# Patient Record
Sex: Female | Born: 1935 | Race: White | Hispanic: No | Smoking: Never smoker
Health system: Southern US, Community
[De-identification: ages and names within clinical notes are randomized; demographics above are authoritative.]

## PROBLEM LIST (undated history)

## (undated) ENCOUNTER — Inpatient Hospital Stay (HOSPITAL_COMMUNITY): Payer: Medicare Other

## (undated) DIAGNOSIS — M810 Age-related osteoporosis without current pathological fracture: Secondary | ICD-10-CM

## (undated) DIAGNOSIS — I83009 Varicose veins of unspecified lower extremity with ulcer of unspecified site: Secondary | ICD-10-CM

## (undated) DIAGNOSIS — Z9089 Acquired absence of other organs: Secondary | ICD-10-CM

## (undated) DIAGNOSIS — E538 Deficiency of other specified B group vitamins: Secondary | ICD-10-CM

## (undated) DIAGNOSIS — L97909 Non-pressure chronic ulcer of unspecified part of unspecified lower leg with unspecified severity: Secondary | ICD-10-CM

## (undated) DIAGNOSIS — I10 Essential (primary) hypertension: Secondary | ICD-10-CM

## (undated) DIAGNOSIS — N390 Urinary tract infection, site not specified: Secondary | ICD-10-CM

## (undated) DIAGNOSIS — Z9849 Cataract extraction status, unspecified eye: Secondary | ICD-10-CM

## (undated) DIAGNOSIS — F29 Unspecified psychosis not due to a substance or known physiological condition: Secondary | ICD-10-CM

## (undated) DIAGNOSIS — H9190 Unspecified hearing loss, unspecified ear: Secondary | ICD-10-CM

## (undated) DIAGNOSIS — R350 Frequency of micturition: Secondary | ICD-10-CM

## (undated) DIAGNOSIS — K900 Celiac disease: Secondary | ICD-10-CM

## (undated) DIAGNOSIS — R071 Chest pain on breathing: Secondary | ICD-10-CM

## (undated) DIAGNOSIS — Z9889 Other specified postprocedural states: Secondary | ICD-10-CM

## (undated) DIAGNOSIS — G4700 Insomnia, unspecified: Secondary | ICD-10-CM

## (undated) DIAGNOSIS — R000 Tachycardia, unspecified: Secondary | ICD-10-CM

## (undated) DIAGNOSIS — Z8659 Personal history of other mental and behavioral disorders: Secondary | ICD-10-CM

## (undated) HISTORY — DX: Unspecified psychosis not due to a substance or known physiological condition: F29

## (undated) HISTORY — DX: Cataract extraction status, unspecified eye: Z98.49

## (undated) HISTORY — DX: Other specified postprocedural states: Z98.89

## (undated) HISTORY — DX: Deficiency of other specified B group vitamins: E53.8

## (undated) HISTORY — DX: Non-pressure chronic ulcer of unspecified part of unspecified lower leg with unspecified severity: L97.909

## (undated) HISTORY — PX: ORIF ANKLE DISLOCATION: SUR918

## (undated) HISTORY — PX: TONSILLECTOMY: SHX5217

## (undated) HISTORY — PX: CATARACT EXTRACTION: SUR2

## (undated) HISTORY — PX: CARDIAC ELECTROPHYSIOLOGY MAPPING AND ABLATION: SHX1292

## (undated) HISTORY — DX: Frequency of micturition: R35.0

## (undated) HISTORY — DX: Acquired absence of other organs: Z90.89

## (undated) HISTORY — PX: HAND SURGERY: SHX662

## (undated) HISTORY — PX: OTHER SURGICAL HISTORY: SHX169

## (undated) HISTORY — DX: Unspecified hearing loss, unspecified ear: H91.90

## (undated) HISTORY — PX: CHOLECYSTECTOMY: SHX55

## (undated) HISTORY — DX: Chest pain on breathing: R07.1

## (undated) HISTORY — DX: Essential (primary) hypertension: I10

## (undated) HISTORY — DX: Personal history of other mental and behavioral disorders: Z86.59

## (undated) HISTORY — PX: ROTATOR CUFF REPAIR: SHX139

## (undated) HISTORY — DX: Tachycardia, unspecified: R00.0

## (undated) HISTORY — DX: Varicose veins of unspecified lower extremity with ulcer of unspecified site: I83.009

## (undated) HISTORY — DX: Insomnia, unspecified: G47.00

## (undated) HISTORY — DX: Urinary tract infection, site not specified: N39.0

## (undated) HISTORY — DX: Celiac disease: K90.0

## (undated) HISTORY — DX: Age-related osteoporosis without current pathological fracture: M81.0

---

## 1997-12-06 ENCOUNTER — Other Ambulatory Visit: Admission: RE | Admit: 1997-12-06 | Discharge: 1997-12-06 | Payer: Self-pay | Admitting: Gynecology

## 1998-05-30 ENCOUNTER — Other Ambulatory Visit: Admission: RE | Admit: 1998-05-30 | Discharge: 1998-05-30 | Payer: Self-pay | Admitting: Gynecology

## 1998-08-22 ENCOUNTER — Other Ambulatory Visit: Admission: RE | Admit: 1998-08-22 | Discharge: 1998-08-22 | Payer: Self-pay | Admitting: Gynecology

## 1999-02-27 ENCOUNTER — Other Ambulatory Visit: Admission: RE | Admit: 1999-02-27 | Discharge: 1999-02-27 | Payer: Self-pay | Admitting: Gynecology

## 1999-06-27 ENCOUNTER — Ambulatory Visit (HOSPITAL_COMMUNITY): Admission: RE | Admit: 1999-06-27 | Discharge: 1999-06-27 | Payer: Self-pay | Admitting: Sports Medicine

## 1999-06-27 ENCOUNTER — Encounter: Payer: Self-pay | Admitting: Sports Medicine

## 1999-08-01 ENCOUNTER — Encounter: Admission: RE | Admit: 1999-08-01 | Discharge: 1999-08-01 | Payer: Self-pay | Admitting: Orthopedic Surgery

## 1999-08-01 ENCOUNTER — Encounter: Payer: Self-pay | Admitting: Orthopedic Surgery

## 1999-08-03 ENCOUNTER — Ambulatory Visit (HOSPITAL_BASED_OUTPATIENT_CLINIC_OR_DEPARTMENT_OTHER): Admission: RE | Admit: 1999-08-03 | Discharge: 1999-08-03 | Payer: Self-pay | Admitting: Orthopedic Surgery

## 1999-08-14 ENCOUNTER — Encounter: Admission: RE | Admit: 1999-08-14 | Discharge: 1999-09-14 | Payer: Self-pay | Admitting: Orthopedic Surgery

## 1999-10-02 ENCOUNTER — Other Ambulatory Visit: Admission: RE | Admit: 1999-10-02 | Discharge: 1999-10-02 | Payer: Self-pay | Admitting: Gynecology

## 2000-01-24 ENCOUNTER — Encounter: Payer: Self-pay | Admitting: *Deleted

## 2000-01-24 ENCOUNTER — Encounter: Admission: RE | Admit: 2000-01-24 | Discharge: 2000-01-24 | Payer: Self-pay | Admitting: *Deleted

## 2000-03-25 ENCOUNTER — Other Ambulatory Visit: Admission: RE | Admit: 2000-03-25 | Discharge: 2000-03-25 | Payer: Self-pay | Admitting: Gynecology

## 2000-05-02 ENCOUNTER — Encounter (INDEPENDENT_AMBULATORY_CARE_PROVIDER_SITE_OTHER): Payer: Self-pay | Admitting: Specialist

## 2000-05-02 ENCOUNTER — Other Ambulatory Visit: Admission: RE | Admit: 2000-05-02 | Discharge: 2000-05-02 | Payer: Self-pay | Admitting: Gynecology

## 2000-08-12 ENCOUNTER — Other Ambulatory Visit: Admission: RE | Admit: 2000-08-12 | Discharge: 2000-08-12 | Payer: Self-pay | Admitting: Gynecology

## 2001-01-28 ENCOUNTER — Other Ambulatory Visit: Admission: RE | Admit: 2001-01-28 | Discharge: 2001-01-28 | Payer: Self-pay | Admitting: Gynecology

## 2001-02-07 ENCOUNTER — Encounter: Admission: RE | Admit: 2001-02-07 | Discharge: 2001-02-07 | Payer: Self-pay | Admitting: Internal Medicine

## 2001-02-07 ENCOUNTER — Encounter: Payer: Self-pay | Admitting: Internal Medicine

## 2001-05-21 ENCOUNTER — Other Ambulatory Visit: Admission: RE | Admit: 2001-05-21 | Discharge: 2001-05-21 | Payer: Self-pay | Admitting: Gynecology

## 2001-06-12 ENCOUNTER — Encounter: Payer: Self-pay | Admitting: Internal Medicine

## 2001-06-12 ENCOUNTER — Encounter: Admission: RE | Admit: 2001-06-12 | Discharge: 2001-06-12 | Payer: Self-pay | Admitting: Internal Medicine

## 2001-07-21 ENCOUNTER — Encounter: Admission: RE | Admit: 2001-07-21 | Discharge: 2001-07-21 | Payer: Self-pay | Admitting: Internal Medicine

## 2001-07-21 ENCOUNTER — Encounter: Payer: Self-pay | Admitting: Internal Medicine

## 2001-10-13 ENCOUNTER — Other Ambulatory Visit: Admission: RE | Admit: 2001-10-13 | Discharge: 2001-10-13 | Payer: Self-pay | Admitting: Gynecology

## 2001-10-21 ENCOUNTER — Encounter: Payer: Self-pay | Admitting: Gynecology

## 2001-10-21 ENCOUNTER — Encounter: Admission: RE | Admit: 2001-10-21 | Discharge: 2001-10-21 | Payer: Self-pay | Admitting: Gynecology

## 2002-03-23 ENCOUNTER — Other Ambulatory Visit: Admission: RE | Admit: 2002-03-23 | Discharge: 2002-03-23 | Payer: Self-pay | Admitting: Gynecology

## 2002-08-14 ENCOUNTER — Encounter: Admission: RE | Admit: 2002-08-14 | Discharge: 2002-08-14 | Payer: Self-pay | Admitting: Internal Medicine

## 2002-08-14 ENCOUNTER — Encounter: Payer: Self-pay | Admitting: Internal Medicine

## 2002-10-15 ENCOUNTER — Other Ambulatory Visit: Admission: RE | Admit: 2002-10-15 | Discharge: 2002-10-15 | Payer: Self-pay | Admitting: Gynecology

## 2002-12-01 ENCOUNTER — Encounter: Payer: Self-pay | Admitting: Internal Medicine

## 2002-12-01 ENCOUNTER — Encounter: Admission: RE | Admit: 2002-12-01 | Discharge: 2002-12-01 | Payer: Self-pay | Admitting: Internal Medicine

## 2003-01-13 ENCOUNTER — Encounter: Payer: Self-pay | Admitting: General Surgery

## 2003-01-18 ENCOUNTER — Ambulatory Visit (HOSPITAL_COMMUNITY): Admission: RE | Admit: 2003-01-18 | Discharge: 2003-01-19 | Payer: Self-pay | Admitting: General Surgery

## 2003-01-18 ENCOUNTER — Encounter (INDEPENDENT_AMBULATORY_CARE_PROVIDER_SITE_OTHER): Payer: Self-pay | Admitting: *Deleted

## 2003-01-18 ENCOUNTER — Encounter: Payer: Self-pay | Admitting: General Surgery

## 2003-05-11 ENCOUNTER — Ambulatory Visit (HOSPITAL_COMMUNITY): Admission: RE | Admit: 2003-05-11 | Discharge: 2003-05-11 | Payer: Self-pay | Admitting: Internal Medicine

## 2003-06-09 ENCOUNTER — Ambulatory Visit (HOSPITAL_COMMUNITY): Admission: RE | Admit: 2003-06-09 | Discharge: 2003-06-09 | Payer: Self-pay | Admitting: Internal Medicine

## 2003-08-13 ENCOUNTER — Encounter: Admission: RE | Admit: 2003-08-13 | Discharge: 2003-08-13 | Payer: Self-pay | Admitting: Gynecology

## 2003-10-19 ENCOUNTER — Other Ambulatory Visit: Admission: RE | Admit: 2003-10-19 | Discharge: 2003-10-19 | Payer: Self-pay | Admitting: Gynecology

## 2004-05-04 ENCOUNTER — Ambulatory Visit: Payer: Self-pay | Admitting: Internal Medicine

## 2004-08-11 ENCOUNTER — Ambulatory Visit (HOSPITAL_COMMUNITY): Admission: RE | Admit: 2004-08-11 | Discharge: 2004-08-11 | Payer: Self-pay | Admitting: Neurosurgery

## 2004-10-30 ENCOUNTER — Other Ambulatory Visit: Admission: RE | Admit: 2004-10-30 | Discharge: 2004-10-30 | Payer: Self-pay | Admitting: Gynecology

## 2004-11-03 ENCOUNTER — Encounter: Admission: RE | Admit: 2004-11-03 | Discharge: 2004-11-03 | Payer: Self-pay | Admitting: Gynecology

## 2005-03-30 ENCOUNTER — Ambulatory Visit: Payer: Self-pay | Admitting: Internal Medicine

## 2005-04-03 ENCOUNTER — Ambulatory Visit: Payer: Self-pay | Admitting: Internal Medicine

## 2005-07-13 ENCOUNTER — Ambulatory Visit: Payer: Self-pay | Admitting: Internal Medicine

## 2005-07-17 ENCOUNTER — Emergency Department (HOSPITAL_COMMUNITY): Admission: EM | Admit: 2005-07-17 | Discharge: 2005-07-17 | Payer: Self-pay | Admitting: Emergency Medicine

## 2005-08-01 ENCOUNTER — Ambulatory Visit: Payer: Self-pay

## 2005-10-29 ENCOUNTER — Ambulatory Visit: Payer: Self-pay | Admitting: Internal Medicine

## 2005-11-05 ENCOUNTER — Encounter (HOSPITAL_BASED_OUTPATIENT_CLINIC_OR_DEPARTMENT_OTHER): Admission: RE | Admit: 2005-11-05 | Discharge: 2005-11-29 | Payer: Self-pay | Admitting: Surgery

## 2005-12-07 ENCOUNTER — Encounter: Admission: RE | Admit: 2005-12-07 | Discharge: 2005-12-07 | Payer: Self-pay | Admitting: Internal Medicine

## 2006-01-01 ENCOUNTER — Encounter: Admission: RE | Admit: 2006-01-01 | Discharge: 2006-01-01 | Payer: Self-pay | Admitting: Internal Medicine

## 2006-01-16 ENCOUNTER — Other Ambulatory Visit: Admission: RE | Admit: 2006-01-16 | Discharge: 2006-01-16 | Payer: Self-pay | Admitting: Gynecology

## 2006-03-13 ENCOUNTER — Ambulatory Visit: Payer: Self-pay | Admitting: Internal Medicine

## 2006-06-19 ENCOUNTER — Encounter: Admission: RE | Admit: 2006-06-19 | Discharge: 2006-06-19 | Payer: Self-pay | Admitting: Internal Medicine

## 2006-07-31 ENCOUNTER — Ambulatory Visit: Payer: Self-pay | Admitting: Internal Medicine

## 2006-12-31 ENCOUNTER — Encounter: Admission: RE | Admit: 2006-12-31 | Discharge: 2006-12-31 | Payer: Self-pay | Admitting: Gynecology

## 2007-01-17 ENCOUNTER — Ambulatory Visit: Payer: Self-pay | Admitting: Internal Medicine

## 2007-01-21 ENCOUNTER — Other Ambulatory Visit: Admission: RE | Admit: 2007-01-21 | Discharge: 2007-01-21 | Payer: Self-pay | Admitting: Gynecology

## 2007-06-02 ENCOUNTER — Encounter: Payer: Self-pay | Admitting: *Deleted

## 2007-06-02 DIAGNOSIS — M810 Age-related osteoporosis without current pathological fracture: Secondary | ICD-10-CM | POA: Insufficient documentation

## 2007-06-02 DIAGNOSIS — E538 Deficiency of other specified B group vitamins: Secondary | ICD-10-CM

## 2007-06-02 DIAGNOSIS — K900 Celiac disease: Secondary | ICD-10-CM

## 2007-06-02 DIAGNOSIS — G301 Alzheimer's disease with late onset: Secondary | ICD-10-CM

## 2007-06-02 DIAGNOSIS — Z9089 Acquired absence of other organs: Secondary | ICD-10-CM

## 2007-06-02 DIAGNOSIS — F0281 Dementia in other diseases classified elsewhere with behavioral disturbance: Secondary | ICD-10-CM

## 2007-06-02 DIAGNOSIS — Z9849 Cataract extraction status, unspecified eye: Secondary | ICD-10-CM

## 2007-06-02 DIAGNOSIS — Z8659 Personal history of other mental and behavioral disorders: Secondary | ICD-10-CM | POA: Insufficient documentation

## 2007-06-02 DIAGNOSIS — Z9889 Other specified postprocedural states: Secondary | ICD-10-CM

## 2007-06-02 DIAGNOSIS — H9190 Unspecified hearing loss, unspecified ear: Secondary | ICD-10-CM | POA: Insufficient documentation

## 2007-06-02 DIAGNOSIS — L97909 Non-pressure chronic ulcer of unspecified part of unspecified lower leg with unspecified severity: Secondary | ICD-10-CM

## 2007-06-02 DIAGNOSIS — F29 Unspecified psychosis not due to a substance or known physiological condition: Secondary | ICD-10-CM

## 2007-06-02 DIAGNOSIS — R000 Tachycardia, unspecified: Secondary | ICD-10-CM | POA: Insufficient documentation

## 2007-06-02 DIAGNOSIS — I83009 Varicose veins of unspecified lower extremity with ulcer of unspecified site: Secondary | ICD-10-CM

## 2007-06-02 HISTORY — DX: Varicose veins of unspecified lower extremity with ulcer of unspecified site: I83.009

## 2007-06-02 HISTORY — DX: Personal history of other mental and behavioral disorders: Z86.59

## 2007-06-02 HISTORY — DX: Deficiency of other specified B group vitamins: E53.8

## 2007-06-02 HISTORY — DX: Age-related osteoporosis without current pathological fracture: M81.0

## 2007-06-02 HISTORY — DX: Cataract extraction status, unspecified eye: Z98.49

## 2007-06-02 HISTORY — DX: Celiac disease: K90.0

## 2007-06-02 HISTORY — DX: Unspecified psychosis not due to a substance or known physiological condition: F29

## 2007-06-02 HISTORY — DX: Acquired absence of other organs: Z90.89

## 2007-06-02 HISTORY — DX: Tachycardia, unspecified: R00.0

## 2007-06-02 HISTORY — DX: Unspecified hearing loss, unspecified ear: H91.90

## 2007-06-02 HISTORY — DX: Other specified postprocedural states: Z98.89

## 2007-06-20 ENCOUNTER — Encounter: Admission: RE | Admit: 2007-06-20 | Discharge: 2007-06-20 | Payer: Self-pay | Admitting: Gynecology

## 2007-08-18 ENCOUNTER — Encounter: Payer: Self-pay | Admitting: Internal Medicine

## 2007-08-25 ENCOUNTER — Encounter: Admission: RE | Admit: 2007-08-25 | Discharge: 2007-08-25 | Payer: Self-pay | Admitting: Neurosurgery

## 2007-08-28 ENCOUNTER — Encounter: Admission: RE | Admit: 2007-08-28 | Discharge: 2007-08-28 | Payer: Self-pay | Admitting: Neurosurgery

## 2007-09-10 ENCOUNTER — Encounter: Payer: Self-pay | Admitting: Internal Medicine

## 2007-10-02 ENCOUNTER — Ambulatory Visit: Payer: Self-pay | Admitting: Internal Medicine

## 2007-10-02 ENCOUNTER — Inpatient Hospital Stay (HOSPITAL_COMMUNITY): Admission: EM | Admit: 2007-10-02 | Discharge: 2007-10-08 | Payer: Self-pay | Admitting: Emergency Medicine

## 2007-12-15 ENCOUNTER — Encounter: Payer: Self-pay | Admitting: Internal Medicine

## 2007-12-16 ENCOUNTER — Encounter: Payer: Self-pay | Admitting: Internal Medicine

## 2007-12-24 ENCOUNTER — Encounter: Payer: Self-pay | Admitting: Internal Medicine

## 2007-12-26 ENCOUNTER — Ambulatory Visit: Payer: Self-pay | Admitting: Internal Medicine

## 2007-12-29 ENCOUNTER — Ambulatory Visit: Payer: Self-pay | Admitting: Internal Medicine

## 2008-01-01 ENCOUNTER — Encounter: Payer: Self-pay | Admitting: Internal Medicine

## 2008-01-05 ENCOUNTER — Encounter: Payer: Self-pay | Admitting: Internal Medicine

## 2008-01-26 ENCOUNTER — Encounter: Payer: Self-pay | Admitting: Internal Medicine

## 2008-01-28 ENCOUNTER — Encounter: Payer: Self-pay | Admitting: Internal Medicine

## 2008-01-30 ENCOUNTER — Encounter: Payer: Self-pay | Admitting: Internal Medicine

## 2008-03-05 ENCOUNTER — Encounter: Payer: Self-pay | Admitting: Internal Medicine

## 2008-07-16 ENCOUNTER — Ambulatory Visit: Payer: Self-pay | Admitting: Internal Medicine

## 2008-07-16 ENCOUNTER — Ambulatory Visit: Payer: Self-pay | Admitting: Cardiovascular Disease

## 2008-07-16 LAB — CONVERTED CEMR LAB
ALT: 15 units/L (ref 0–35)
Amylase: 56 units/L (ref 27–131)
BUN: 10 mg/dL (ref 6–23)
Bilirubin Urine: NEGATIVE
Bilirubin, Direct: 0.1 mg/dL (ref 0.0–0.3)
CO2: 27 meq/L (ref 19–32)
Chloride: 104 meq/L (ref 96–112)
Creatinine, Ser: 0.7 mg/dL (ref 0.4–1.2)
Eosinophils Absolute: 0.1 10*3/uL (ref 0.0–0.7)
GFR calc Af Amer: 103 mL/min
Glucose, Bld: 103 mg/dL — ABNORMAL HIGH (ref 70–99)
Hemoglobin, Urine: NEGATIVE
Ketones, ur: 15 mg/dL — AB
Lymphocytes Relative: 17.8 % (ref 12.0–46.0)
Monocytes Absolute: 0.6 10*3/uL (ref 0.1–1.0)
Neutro Abs: 5.5 10*3/uL (ref 1.4–7.7)
Neutrophils Relative %: 73.2 % (ref 43.0–77.0)
Platelets: 229 10*3/uL (ref 150–400)
Potassium: 4.2 meq/L (ref 3.5–5.1)
RBC: 3.62 M/uL — ABNORMAL LOW (ref 3.87–5.11)
Total Protein: 6.2 g/dL (ref 6.0–8.3)
Urine Glucose: NEGATIVE mg/dL

## 2008-11-26 ENCOUNTER — Ambulatory Visit: Payer: Self-pay | Admitting: Internal Medicine

## 2008-11-26 DIAGNOSIS — R350 Frequency of micturition: Secondary | ICD-10-CM | POA: Insufficient documentation

## 2008-11-26 HISTORY — DX: Frequency of micturition: R35.0

## 2008-11-26 LAB — CONVERTED CEMR LAB
Bilirubin Urine: NEGATIVE
Specific Gravity, Urine: 1.01 (ref 1.000–1.030)
Total Protein, Urine: NEGATIVE mg/dL
Urobilinogen, UA: 0.2 (ref 0.0–1.0)
pH: 7 (ref 5.0–8.0)

## 2008-12-01 ENCOUNTER — Encounter (HOSPITAL_BASED_OUTPATIENT_CLINIC_OR_DEPARTMENT_OTHER): Admission: RE | Admit: 2008-12-01 | Discharge: 2009-01-18 | Payer: Self-pay | Admitting: General Surgery

## 2008-12-01 ENCOUNTER — Encounter: Payer: Self-pay | Admitting: Internal Medicine

## 2008-12-15 ENCOUNTER — Encounter: Payer: Self-pay | Admitting: Internal Medicine

## 2008-12-28 ENCOUNTER — Encounter: Payer: Self-pay | Admitting: Internal Medicine

## 2009-01-12 ENCOUNTER — Encounter: Admission: RE | Admit: 2009-01-12 | Discharge: 2009-01-12 | Payer: Self-pay | Admitting: Gynecology

## 2009-01-21 ENCOUNTER — Ambulatory Visit: Payer: Self-pay | Admitting: Internal Medicine

## 2009-01-21 LAB — CONVERTED CEMR LAB
Ketones, ur: NEGATIVE mg/dL
Nitrite: NEGATIVE
Specific Gravity, Urine: 1.015 (ref 1.000–1.030)
Urine Glucose: NEGATIVE mg/dL
Urobilinogen, UA: 0.2 (ref 0.0–1.0)

## 2009-02-07 ENCOUNTER — Telehealth: Payer: Self-pay | Admitting: Internal Medicine

## 2009-03-04 ENCOUNTER — Telehealth: Payer: Self-pay | Admitting: Internal Medicine

## 2009-03-24 ENCOUNTER — Encounter: Payer: Self-pay | Admitting: Internal Medicine

## 2009-04-20 ENCOUNTER — Encounter: Payer: Self-pay | Admitting: Internal Medicine

## 2009-11-24 ENCOUNTER — Ambulatory Visit: Payer: Self-pay | Admitting: Internal Medicine

## 2009-11-24 DIAGNOSIS — R071 Chest pain on breathing: Secondary | ICD-10-CM

## 2009-11-24 HISTORY — DX: Chest pain on breathing: R07.1

## 2010-01-13 ENCOUNTER — Encounter: Admission: RE | Admit: 2010-01-13 | Discharge: 2010-01-13 | Payer: Self-pay | Admitting: Internal Medicine

## 2010-07-20 NOTE — Assessment & Plan Note (Signed)
Summary: breast pain/offered sda-no per husband/cd   Vital Signs:  Patient profile:   77 year old female Height:      61 inches Weight:      114 pounds BMI:     21.62 Pulse rate:   88 / minute BP sitting:   118 / 70  (left arm)  Vitals Entered By: Lamar Sprinkles, CMA (November 24, 2009 3:22 PM) CC: C/o bilateral breast pain, worse on right side x 1 mth. Also c/o urinary frequency and burning x 2 to 3 wks.  Comments Unsure of Depakote dosage, will confirm with rx at home and report back to office. Also unsure of Levaquin rx.    Primary Care Provider:  Jacques Navy MD  CC:  C/o bilateral breast pain and worse on right side x 1 mth. Also c/o urinary frequency and burning x 2 to 3 wks. .  History of Present Illness: presents for 1) right breast pain - generalized. She is current with mammography - negative                       2) Urinary frequency with dysuria - burns with urination, several episodes of nocturia                           daily. No fever, no discharge.   Allergies (verified): 1)  ! Penicillin 2)  ! Sulfa 3)  ! 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 blind bilateral - retinitis pigmentosa - since 2005 Osteoporosis  Past Surgical History: Last updated: 06/02/2007 * Hx of CARDIAC ABLATION * HAND SURGERY ROTATOR CUFF REPAIR, HX OF (ICD-V45.89) * EXCISION OF LARGE MOLE FROM BACK AND BREAST. CHOLECYSTECTOMY, HX OF (ICD-V45.79) TONSILLECTOMY, HX OF (ICD-V45.79) * ORIF RIGHT ANKLE CATARACT  EXTRACTION, HX OF (ICD-V45.61)    Family History: Last updated: 07/16/2008 father with lung cancer mother with stroke m-aunt with breast cancer  Social History: Last updated: 01/21/2009 Married '48 3 daughters - '40, '55, '59 lives at home with husband.  daughter moved in a few months ago to help parents, to cook for her mom (celiac dz.).   Never Smoked Alcohol use-no  Risk Factors: Smoking Status: never (07/16/2008)  Review of Systems       The patient complains of difficulty walking and depression.  The patient denies anorexia, fever, weight loss, weight gain, decreased hearing, hoarseness, syncope, dyspnea on exertion, prolonged cough, headaches, abdominal pain, hematochezia, incontinence, muscle weakness, enlarged lymph nodes, and breast masses.    Physical Exam  General:  Elderly white female, very insecure with moving. Head:  normocephalic and atraumatic.   Mouth:  edentulous Neck:  supple.   Breasts:  No mass, nodules, thickening, tenderness, bulging, retraction, inflamation, nipple discharge or skin changes noted.   Lungs:  normal respiratory effort and normal breath sounds.   Heart:  normal rate and regular rhythm.   Abdomen:  soft and normal bowel sounds.     Impression & Recommendations:  Problem # 1:  CHEST WALL PAIN, ANTERIOR (VHQ-469.62) Patient c/o breast pain but on exam  breast is normal and tenderness with palpation of the intercostal spaces much worse on the right.  Plan - naproxen sodium 1 or 2 tablets AM and PM  Her updated medication list for this problem includes:    Baby Aspirin 81 Mg Chew (Aspirin) .Marland Kitchen... Take one tablet once daily    Naproxen 250 Mg Tabs (Naproxen) .Marland Kitchen... 1 or 2  by mouth two times a day  Problem # 2:  FREQUENCY, URINARY (ICD-788.41) patient with symptoms strongly suggestive of a UTI  Plan - cipro 250mg  two times a day x 5  Complete Medication List: 1)  Baby Aspirin 81 Mg Chew (Aspirin) .... Take one tablet once daily 2)   Atenolol 50 Mg Tabs (Atenolol) .... Take one tablet once daily 3)  Calcium  .... Take one tablet daily 4)  Multivitamins Tabs (Multiple vitamin) .... Take one tablet once daily 5)  Zyprexa 10 Mg Tabs (Olanzapine) .... Take one half tablet once daily 6)  Depakote 250 Mg Tbec (Divalproex sodium) .... Take one tablet three times a daily 7)  Aricept 10 Mg Tabs (Donepezil hcl) .... Take one tablet once daily 8)  Zoloft 50 Mg Tabs (Sertraline hcl) .... Take one tablet once daily 9)  Ra Folic Acid 800 Mcg Tabs (Folic acid) .... Take one tablet once daily 10)  Ciprofloxacin Hcl 250 Mg Tabs (Ciprofloxacin hcl) .Marland Kitchen.. 1 by mouth two times a day x 5 11)  Naproxen 250 Mg Tabs (Naproxen) .Marland Kitchen.. 1 or 2  by mouth two times a day  Patient Instructions: 1)  Breast pain - normal exam with no lesions, lumps or other abnormality. Suspect the pain is from the chest wall. Plan - over the counter generic aleve 1 or 2 AM and PM. Watch for any stomach irritation. 2)  Urinary frequency - may be UTI. Plan - cipro 250mg  two times a day x 5 days.  Prescriptions: CIPROFLOXACIN HCL 250 MG TABS (CIPROFLOXACIN HCL) 1 by mouth two times a day x 5  #10 x 0   Entered and Authorized by:   Jacques Navy MD   Signed by:   Jacques Navy MD on 11/24/2009   Method used:   [facility]        [facility]  [address] [phone] (retail)       [address]       [place], [place]  [address]       Ph: [phone] or [phone]       Fax: [phone]   RxID:   [id number]

## 2010-08-21 ENCOUNTER — Other Ambulatory Visit: Payer: Medicare Other

## 2010-08-21 ENCOUNTER — Ambulatory Visit (INDEPENDENT_AMBULATORY_CARE_PROVIDER_SITE_OTHER): Payer: Medicare Other | Admitting: Internal Medicine

## 2010-08-21 ENCOUNTER — Ambulatory Visit: Payer: Self-pay | Admitting: Internal Medicine

## 2010-08-21 ENCOUNTER — Encounter: Payer: Self-pay | Admitting: Internal Medicine

## 2010-08-21 ENCOUNTER — Other Ambulatory Visit: Payer: Self-pay | Admitting: Internal Medicine

## 2010-08-21 DIAGNOSIS — K900 Celiac disease: Secondary | ICD-10-CM

## 2010-08-21 DIAGNOSIS — F29 Unspecified psychosis not due to a substance or known physiological condition: Secondary | ICD-10-CM

## 2010-08-21 DIAGNOSIS — Z8659 Personal history of other mental and behavioral disorders: Secondary | ICD-10-CM

## 2010-08-21 DIAGNOSIS — G4700 Insomnia, unspecified: Secondary | ICD-10-CM | POA: Insufficient documentation

## 2010-08-21 DIAGNOSIS — R350 Frequency of micturition: Secondary | ICD-10-CM

## 2010-08-21 DIAGNOSIS — E538 Deficiency of other specified B group vitamins: Secondary | ICD-10-CM

## 2010-08-21 HISTORY — DX: Insomnia, unspecified: G47.00

## 2010-08-21 LAB — CBC WITH DIFFERENTIAL/PLATELET
Basophils Absolute: 0 10*3/uL (ref 0.0–0.1)
Basophils Relative: 0.3 % (ref 0.0–3.0)
Eosinophils Absolute: 0 10*3/uL (ref 0.0–0.7)
MCHC: 35.5 g/dL (ref 30.0–36.0)
Neutro Abs: 4.6 10*3/uL (ref 1.4–7.7)
RDW: 15.5 % — ABNORMAL HIGH (ref 11.5–14.6)

## 2010-08-21 LAB — BASIC METABOLIC PANEL
CO2: 27 mEq/L (ref 19–32)
GFR: 89.05 mL/min (ref >60.00)
Glucose, Bld: 94 mg/dL (ref 70–99)
Potassium: 4.1 mEq/L (ref 3.5–5.1)
Sodium: 138 mEq/L (ref 135–145)

## 2010-08-21 LAB — HEPATIC FUNCTION PANEL
ALT: 18 U/L (ref 0–35)
Albumin: 4.1 g/dL (ref 3.5–5.2)
Total Bilirubin: 0.4 mg/dL (ref 0.3–1.2)

## 2010-08-21 LAB — B12 AND FOLATE PANEL: Vitamin B-12: 993 pg/mL — ABNORMAL HIGH (ref 211–911)

## 2010-08-22 ENCOUNTER — Encounter: Payer: Self-pay | Admitting: Internal Medicine

## 2010-08-29 NOTE — Letter (Signed)
[facility] [address] [place], [place]  [address] Phone: [phone]      August 22, 2010   [facility] [address] [place], [place] [address]  RE:  LAB RESULTS  Dear  Ms. Donatelli,  The following is an interpretation of your most recent lab tests.  Please take note of any instructions provided or changes to medications that have resulted from your lab work.  ELECTROLYTES:  Good - no changes needed  KIDNEY FUNCTION TESTS:  Good - no changes needed  LIVER FUNCTION TESTS:  Good - no changes needed  THYROID STUDIES:  Thyroid studies normal TSH: 2.33     DIABETIC STUDIES:  Good - no changes needed Blood Glucose: 94    CBC:  Good - no changes needed  All lab results are within normal range.  Please come see me if you have any questions about these lab results.    Sincerely Yours,    Jacques Navy MD  Patient: Jill Mckinney Note: All result statuses are Final unless otherwise noted.  Tests: (1) CBC Platelet w/Diff (CBCD)   White Cell Count          6.7 K/uL                    4.5-10.5   Red Cell Count            4.01 Mil/uL                 3.87-5.11   Hemoglobin                14.0 g/dL                   13.0-86.5   Hematocrit                39.4 %                      36.0-46.0   MCV                       98.2 fl                     78.0-100.0   MCHC                      35.5 g/dL                   78.4-69.6   RDW                  [H]  15.5 %                      11.5-14.6   Platelet Count            323.0 K/uL                  150.0-400.0   Neutrophil %              68.2 %                      43.0-77.0   Lymphocyte %              22.0 %                      12.0-46.0  Monocyte %                8.8 %                       3.0-12.0   Eosinophils%              0.7 %                       0.0-5.0   Basophils %               0.3 %                       0.0-3.0   Neutrophill Absolute      4.6 K/uL                    1.4-7.7  Lymphocyte Absolute       1.5 K/uL                    0.7-4.0   Monocyte Absolute         0.6 K/uL                    0.1-1.0  Eosinophils, Absolute                             0.0 K/uL                    0.0-0.7   Basophils Absolute        0.0 K/uL                    0.0-0.1  Tests: (2) B12 + Folate Panel (B12/FOL)   Vitamin B12          [H]  993 pg/mL                   211-911   Folate                    >24.8 ng/mL                 >5.9  Tests: (3) Hepatic/Liver Function Panel (HEPATIC)   Total Bilirubin           0.4 mg/dL                   1.6-1.0   Direct Bilirubin          0.1 mg/dL                   9.6-0.4   Alkaline Phosphatase      55 U/L                      39-117   AST                       27 U/L                      0-37   ALT                       18 U/L                      0-35  Total Protein             6.5 g/dL                    1.6-1.0   Albumin                   4.1 g/dL                    9.6-0.4  Tests: (4) BMP (METABOL)   Sodium                    138 mEq/L                   135-145   Potassium                 4.1 mEq/L                   3.5-5.1   Chloride                  99 mEq/L                    96-112   Carbon Dioxide            27 mEq/L                    19-32   Glucose                   94 mg/dL                    54-09   BUN                       19 mg/dL                    8-11   Creatinine                0.7 mg/dL                   9.1-4.7   Calcium                   9.7 mg/dL                   8.2-95.6   GFR                       89.05 mL/min                >60.00  Tests: (5) TSH (TSH)   FastTSH                   2.33 uIU/mL                 0.35-5.50

## 2010-08-29 NOTE — Assessment & Plan Note (Signed)
Summary: multiple issues,last ov june 2011/cd   Vital Signs:  Patient profile:   77 year old female Height:      61 inches Weight:      114 pounds BMI:     21.62 O2 Sat:      97 % on Room air Temp:     97.4 degrees F oral Pulse rate:   89 / minute BP sitting:   120 / 68  (left arm) Cuff size:   regular  Vitals Entered By: Bill Salinas CMA (August 21, 2010 11:53 AM)  O2 Flow:  Room air  Primary Care Provider:  Jacques Navy MD   History of Present Illness: Jill Mckinney is an 77 y/o white woman with loss of vision and severe anxiety. Her husband reports increased urinary frequency day and night. She also needs something for sleep. She seea a psychiatrist, Dr. Betti Cruz.: she is on depakote 250 three times a day, aricept 10mg  once daily, namenda 10 mg bid, zoloft 50mg  once daily, zyprexa 10 mg once a day. No major medical problems of late.   Current Medications (verified): 1)  Baby Aspirin 81 Mg  Chew (Aspirin) .... Take One Tablet Once Daily 2)  Atenolol 50 Mg  Tabs (Atenolol) .... Take One Tablet Once Daily 3)  Calcium .... Take One Tablet Daily 4)  Multivitamins   Tabs (Multiple Vitamin) .... Take One Tablet Once Daily 5)  Zyprexa 10 Mg  Tabs (Olanzapine) .... Take One Tablet Once Daily 6)  Depakote 250 Mg  Tbec (Divalproex Sodium) .... Take One Tablet Three Times A Daily 7)  Aricept 10 Mg  Tabs (Donepezil Hcl) .... Take One Tablet Once Daily 8)  Zoloft 50 Mg  Tabs (Sertraline Hcl) .... Take One Tablet Once Daily 9)  Ra Folic Acid 800 Mcg  Tabs (Folic Acid) .... Take One Tablet Once Daily 10)  Ciprofloxacin Hcl 250 Mg Tabs (Ciprofloxacin Hcl) .Marland Kitchen.. 1 By Mouth Two Times A Day X 5 11)  Naproxen 250 Mg Tabs (Naproxen) .Marland Kitchen.. 1 or 2  By Mouth Two Times A Day  Allergies (verified): 1)  ! Penicillin 2)  ! Sulfa 3)  ! 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 blind bilateral - retinitis pigmentosa - since 2005 Osteoporosis  Past Surgical History: Last updated: 06/02/2007 * Hx of CARDIAC ABLATION * HAND SURGERY ROTATOR CUFF REPAIR, HX OF (ICD-V45.89) * EXCISION OF LARGE MOLE FROM BACK AND BREAST. CHOLECYSTECTOMY, HX OF (ICD-V45.79) TONSILLECTOMY, HX OF (ICD-V45.79) * ORIF RIGHT ANKLE CATARACT EXTRACTION, HX OF (ICD-V45.61)    Family History: Last updated: 07/16/2008 father with lung cancer mother with stroke m-aunt with breast cancer  Social History: Last updated: 01/21/2009 Married '48 3 daughters - '40, '55, '59 lives at home with husband.  daughter moved in a few months ago to help parents, to cook for her mom (celiac dz.).   Never Smoked Alcohol use-no  Review of Systems       The patient complains of vision loss, muscle weakness, difficulty walking, and depression.  The patient denies anorexia, fever, weight loss, weight gain, decreased hearing, chest pain, dyspnea on exertion,  peripheral edema, prolonged cough, headaches, abdominal pain, severe indigestion/heartburn, incontinence, abnormal bleeding, enlarged lymph nodes, and breast masses.    Physical Exam  General:  alert, well-developed, and well-nourished  elderly blind white woman who is in no acute distress.   Head:  normocephalic and atraumatic.   Mouth:  edentulouson maxilla, nagtive teeth mandible Neck:  supple, full ROM, and no thyromegaly.   Chest Wall:  no deformities.   Lungs:  normal respiratory effort and normal breath sounds.   Heart:  normal rate, regular rhythm, no murmur, and no gallop.     Abdomen:  soft, non-tender, normal bowel sounds, and no guarding.   Msk:  normal ROM, no joint tenderness, no joint swelling, and no joint warmth.   Pulses:  2+ radial Extremities:  No edema Neurologic:  awake, nervous, blind. Speech is dysarthric and she appears anxious Skin:  color normal and no suspicious lesions.   Cervical Nodes:  no anterior cervical adenopathy and no posterior cervical adenopathy.   Psych:  demented and sensory impairment making it hard to assess. She does follow with Dr. Jae Dire office.    Impression & Recommendations:  Problem # 1:  FREQUENCY, URINARY (ICD-788.41) Unlikley to be infection and more likely psychogenic  PlaAN - cbc R/O INFECTION Orders: TLB-CBC Platelet - w/Differential (85025-CBCD)  Problem # 2:  DEMENTIA, HX OF (ICD-V11.8) Patient with progressive dementia on multiple medications.  No change in meds but will check labs  Problem # 3:  VITAMIN B12 DEFICIENCY (ICD-266.2) Will check lab today with recommendations to follow  Orders: TLB-B12 + Folate Pnl (82746_82607-B12/FOL)  Problem # 4:  INSOMNIA, CHRONIC (ICD-307.42) Husband reports that she has a great deal of trouble sleeping.  Plan - trial of trazodone 50mg  at bedtime.   Complete Medication List: 1)  Baby Aspirin 81 Mg Chew (Aspirin) .... Take one tablet once daily 2)  Atenolol 50 Mg Tabs (Atenolol) .... Take one tablet once daily 3)  Calcium  .... Take one tablet daily 4)  Multivitamins Tabs (Multiple vitamin) .... Take one tablet once daily 5)  Zyprexa 10 Mg Tabs (Olanzapine) .... Take one tablet once daily 6)  Depakote 250 Mg Tbec (Divalproex sodium) .... Take one tablet three times a daily 7)  Aricept 10 Mg Tabs (Donepezil hcl) .... Take one tablet once daily 8)  Zoloft 50 Mg Tabs (Sertraline hcl) .... Take one tablet once daily 9)  Ra Folic Acid 800 Mcg Tabs (Folic acid) .... Take one tablet once daily 10)  Ciprofloxacin Hcl 250 Mg Tabs (Ciprofloxacin hcl) .Marland Kitchen.. 1 by mouth  two times a day x 5 11)  Naproxen 250 Mg Tabs (Naproxen) .Marland Kitchen.. 1 or 2  by mouth two times a day 12)  Namenda 10 Mg Tabs (Memantine hcl) .Marland Kitchen.. 1 by mouth at bedtime 13)  Trazodone Hcl 50 Mg Tabs (Trazodone hcl) .Marland Kitchen.. 1 by mouth at bedtime for sleep  Other Orders: TLB-Hepatic/Liver Function Pnl (80076-HEPATIC) TLB-BMP (Basic Metabolic Panel-BMET) (80048-METABOL) TLB-TSH (Thyroid Stimulating Hormone) (84443-TSH) Prescriptions: TRAZODONE HCL 50 MG TABS (TRAZODONE HCL) 1 by mouth at bedtime for sleep  #30 x 12   Entered and Authorized by:   Jacques Navy MD   Signed by:   Jacques Navy MD on 08/21/2010   Method used:   [facility]        [facility]  [address] [phone]* (retail)       [address]       [place], [place]  [address]  Ph: [phone] or [phone]       Fax: [phone]   RxID:   [id number]    Orders Added: 1)  TLB-CBC Platelet - w/Differential [85025-CBCD] 2)  TLB-B12 + Folate Pnl [[id number]-B12/FOL] 3)  TLB-Hepatic/Liver Function Pnl [80076-HEPATIC] 4)  TLB-BMP (Basic Metabolic Panel-BMET) [80048-METABOL] 5)  TLB-TSH (Thyroid Stimulating Hormone) [84443-TSH] 6)  Est. Patient Level III [60630]

## 2010-10-31 NOTE — Op Note (Signed)
NAMEDONNELLA, MORFORD              ACCOUNT NO.:  [id number]   MEDICAL RECORD NO.:  [id number]          PATIENT TYPE:   LOCATION:                                 FACILITY:   PHYSICIAN:  Danae Orleans. Venetia Maxon, M.D.       DATE OF BIRTH:   DATE OF PROCEDURE:  DATE OF DISCHARGE:                               OPERATIVE REPORT   PREOPERATIVE DIAGNOSIS:  L1 compression fracture with severe back pain.   POSTOPERATIVE DIAGNOSIS:  L1 compression fracture with severe back pain.   PROCEDURE:  L1 kyphoplasty with  fluoroscopy.   SURGEON:  Danae Orleans. Venetia Maxon, M.D.   ANESTHESIA:  General endotracheal anesthesia.   ESTIMATED BLOOD LOSS:  Minimal.   COMPLICATIONS:  None.   DISPOSITION:  Recovery.   INDICATIONS:  Chaylee Ehrsam is an 77 year old woman with progressive  incapacity and known osteoporosis who fell and fractured, has a superior  endplate L1 fracture, which is disabling to her.  She was admitted to  the hospital through the emergency room.  I was asked to see the patient  in consultation and recommended to her and her family that she undergo  L1 kyphoplasty procedure.   PROCEDURE:  Ms. Northrop was brought to the operating room.  Following  satisfaction uncomplicated induction of general endotracheal anesthesia  and placement of intravenous lines, the patient was placed in the prone  position on chest and pelvic rolls.  Her mid and upper back were then  prepped and draped in usual sterile fashion after positioning AP and  lateral fluoroscopy overlying the L1 vertebra.  Using a unipedicular  approach from the right side at midline, the L1 pedicle on the right was  identified and the introducer was passed after a stab incision, after  infiltrating skin and subcutaneous tissues with 1% local lidocaine.  Using standard trajectory, the introducer was introduced  transpedicularly into the vertebral body and then a 20 mL balloon was  inserted and gradually inflated.  Subsequently, there was  good filling  of the balloon and no evidence of any violations of cortex.  Pressure  was quite low.  Subsequently, bone cement was mixed, and then with 3  separate fills, there was good filling along the superior aspect of the  vertebral body and what appeared to be the fracture cleft, and also  within the previously placed balloon placement, there is approximately  2/3 anterior to posterior and good filling from side to side.  It was  felt that there was adequate filling.  The introducer was then removed.  There was no tail of bone cement and did not appear to be  any extravasation of bone cement.  The incision was then closed with  single 3-0 Vicryl stitch, the wound of dressed with Dermabond.  The  patient was extubated in operating room and taken to recovery in stable  and satisfactory condition, having tolerated operation well.  Counts  were correct.      Danae Orleans. Venetia Maxon, M.D.  Electronically Signed     JDS/MEDQ  D:  10/06/2007  T:  10/07/2007  Job:  [id number]

## 2010-10-31 NOTE — H&P (Signed)
NAME:  Jill Mckinney, Jill Mckinney NO.:  [id number]   MEDICAL RECORD NO.:  [id number]          PATIENT TYPE:  REC   LOCATION:  FOOT                         FACILITY:  [facility]   PHYSICIAN:  Ardath Sax, M.D.     DATE OF BIRTH:  04-22-1924   DATE OF ADMISSION:  12/01/2008  DATE OF DISCHARGE:                              HISTORY & PHYSICAL   This is an 77 year old lady who enters because of a wound on the  anterior aspect of her left leg which was caused the husband said when  she was going to the bathroom and bumped her leg.  The wound is clean  with good granulation tissue.  It is about 2 cm in length and 0.5 cm in  width.  She was seen by her family doctor and he had put her on Levaquin  which she has been taking for two weeks.  The husband has been taking  care of her leg and has been changing a dressing every day.   PAST MEDICAL HISTORY:  This lady has many medical problems including:  1. Dementia.  2. She has coronary artery disease.  3. She has had tachyarrhythmia and she has had ablation procedures.  4. She is blind with retinitis.  5. She has osteoporosis with some fractures in her spine.  6. She has had a kyphoplasty in the past.   She is very confused but the husband has been taking very good care of  her and changing the dressing every day.  He is been using Neosporin.   PHYSICAL EXAMINATION:  VITAL SIGNS:  Blood pressure 145/76, heart rate  73, temperature 98.  GENERAL:  She is very frail and confused, but the husband answers all  the questions and is very attentive.  EXTREMITIES:  The patient has excellent pedal pulses and she has no  edema.  She had a similar episode of a traumatic ulcer on the anterior  aspect of her right leg three years ago which was treated here in the  wound clinic and has healed very nicely.   She will have a hydrogel dressing change by the husband every day and  she will come back here in 2 weeks.   MEDICATIONS:  She is on many  medications including atenolol, aspirin,  Zyprexa, Depakote, Aricept, Zoloft, vitamins, Toradol, Haldol, Ativan,,  milk of magnesia.  She takes Vicodin, Atarax for nausea, and Ambien for  sleep.      Ardath Sax, M.D.     PP/MEDQ  D:  12/06/2008  T:  12/06/2008  Job:  [id number]

## 2010-10-31 NOTE — Discharge Summary (Signed)
Jill Mckinney, Jill Mckinney              ACCOUNT NO.:  [id number]   MEDICAL RECORD NO.:  [id number]          PATIENT TYPE:  INP   LOCATION:  5529                         FACILITY:  [facility]   PHYSICIAN:  Valerie A. Felicity Coyer, MDDATE OF BIRTH:  [birth year]   DATE OF ADMISSION:  10/02/2007  DATE OF DISCHARGE:                               DISCHARGE SUMMARY   DISCHARGE DIAGNOSES:  1. Status post fall prior to admission secondary to chronic gait      disturbance with legal blindness.  2. Acute L1-L5 and T4 fracture with right fourth and fifth rib      fracture and pulmonary contusion secondary to fall, status post      kyphoplasty of L1 on October 06, 2007, performed by Dr. Maeola Harman.  3. Dementia with history of psychosis.  4. Chronic venous stasis.  5. History of tachyarrhythmia, status post ablation, currently stable.  6. History of celiac disease   HISTORY OF PRESENT ILLNESS:  Jill Mckinney is an 77 year old white female  admitted on October 02, 2007, status post fall with chief complaint of  back pain.  She has a history of severe dementia and blindness, and  according to the husband, he left the house at 6:00 a.m. on the morning  of admission and returned at 11:00 a.m. to find his wife on the floor.  He said that she often attempts to get up and use the bathroom.  Her  chronic low back pain had been exacerbated since this fall.  The  patient's husband denied any recent illnesses fever or shortness of  breath at the time of admission.  She was admitted for further  evaluation and treatment.   PAST MEDICAL HISTORY:  1. Dementia with history of psychosis.  2. 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  3. Venous stasis ulcers.  4. B12 deficiency.  5. Osteoporosis.  6. Tachyarrhythmia, status post ablation.  7. Celiac disease.  8. Cholecystectomy in 2004.  9. Tonsillectomy.  10.History of ORIF of right ankle.  11.History of hearing loss.  12.Status post rotator cuff repair in 2001.  13.Chronic back pain, status post steroid injections 2 weeks prior to      this admission.   COURSE OF HOSPITALIZATION:  1. Status post fall.  The patient was admitted and underwent a CT of      the head which showed no acute bleed or CVA.  CT of the spine noted      a new T4 fracture and acute compression fracture of L1 and      questionable acute compression fracture of L5.  CT angio of the      chest was negative for PE, it did note right fourth and fifth rib      fractures with a right middle lobe pulmonary contusion.  She was      treated with a lidocaine patch, as well as p.r.n. morphine for      pain.  She then underwent an MRI of the lumbar and thoracic spine.      This noted an acute compression fracture at L1 with degenerative  changes of the lower lumbar spine and an old compression fracture      was noted at T10 with an acute or subacute superior endplate      compression fracture at T4.  The patient was seen by neurosurgery      during this admission, Dr. Venetia Maxon and underwent an L1 kyphoplasty      with fluoroscopy, performed on October 06, 2007  2. Dementia with history of psychosis.  At this time, PT/OT recommend      transfer to a skilled nursing facility.  The patient's family is      agreeable.  She is medically stable for transfer.   PHYSICAL EXAMINATION:  VITAL SIGNS:  BP 145/76, heart rate 73,  respiratory rate 16, temperature 98.2.  O2 sat 98%.  GENERAL:  The patient is a frail elderly white female who is confused,  but arousable and answers simple questions appropriately.  CARDIOVASCULAR:  S1-S2.  Regular rate and rhythm is noted.  LUNGS:  Clear to auscultation bilaterally without wheezes, rales or  rhonchi.  ABDOMEN:  Soft, nontender, nondistended.  Positive bowel sounds are  noted.  EXTREMITIES:  Show no peripheral edema.  NEURO:  The patient is confused; however, she will answer simple  questions.  She is able to follow simple commands.  She has positive   facial symmetry and is moving all extremities with generalized weakness  and no focal neuro deficits with the exception of the patient's  blindness.   PERTINENT LABORATORY DATA:  At the time of discharge, hemoglobin 13.2,  hematocrit 37.2, white blood cell count 8.3, BUN 17, creatinine 0.74.   DISPOSITION:  Plan to transfer the patient to a skilled nursing  facility.  The patient's family has chosen [facility].   DISCHARGE MEDICATIONS:  1. Lidocaine transdermal patch to be applied topically once daily and      to be removed after 12 hours  2. Atenolol 50 mg p.o. daily.  3. Aspirin 81 mg p.o. daily.  4. Zyprexa 10 mg p.o. nightly.  5. Depakote 250 mg p.o. t.i.d.  6. Aricept 10 mg p.o. nightly.  7. Zoloft 50 mg p.o. daily.  8. Folic acid 1 mg p.o. daily.  9. Senokot 1 tablet p.o. t.i.d.  10.Toradol 15 mg IV q.6 h., through October 10, 2007, then discontinue.  11.Colace 100 mg p.o. b.i.d.  12.Haldol 2-4 mg IV q.4 h., p.r.n.  13.Ativan 0.5 to 1 mg p.o. or IV q.4 h., p.r.n.  14.Tylenol 650 mg q.4 h., p.r.n.  15.Morphine sulfate 1-2 mg IV q.4 h., p.r.n.  16.Milk of Magnesia 15 mL p.o. q.2 h., p.r.n.  17.Fleet enema PR p.r.n.  18.Vicodin 1-2 tablets p.o. q.4 h., p.r.n.  19.Percocet 5/325 1-2 tablets p.o. q.4 h., p.r.n.  20.Atarax 50 mg p.o. q.3 h., p.r.n. nausea.  21.Zofran 4 mg IV q.4 h., p.r.n.  22.Chloraseptic spray 1 spray topically p.r.n. for throat discomfort.  23.Cepacol lozenges 1 lozenge p.o. p.r.n.  24.Senokot 1 tablet p.o. nightly.  25.Ambien 5 mg p.o. nightly p.r.n.   FOLLOW UP:  Upon discharge as needed.  The patient will need follow-up  with her primary care Alix Lahmann at [facility], Dr. Illene Regulus.      Sandford Craze, NP      Raenette Rover. Felicity Coyer, MD  Electronically Signed    MO/MEDQ  D:  10/08/2007  T:  10/08/2007  Job:  [id number]   cc:   [name], MD

## 2010-10-31 NOTE — Assessment & Plan Note (Signed)
[facility]   NAME:  Jill Mckinney, Jill Mckinney              ACCOUNT NO.:  [id number]   MEDICAL RECORD NO.:  [id number]      DATE OF BIRTH:  [birth year]   PHYSICIAN:  Leonie Man, M.D.         VISIT DATE:                                   OFFICE VISIT   [name] is an 77 year old lady with dementia, Parkinson disease, and  with bilateral blindness.  The patient presented with a small venous  stasis ulcer, which was occasioned by trauma to her left anterior leg.  She was seen last on December 21, 2008, and treated with hydrogel and an  Allevyn pad and given a prescription for some compression stockings at  20 mmHg pressure.  Bactroban was used on the wound.  The patient returns  today for reevaluation.   Vital signs show a temperature of 98.4, pulse 91, respirations 17, blood  pressure 151/83.  On examination, the wound is now completely closed and  fully epithelialized.  No further therapy is required.  I have suggested  that she continues to wear her compression stockings, however, in my  conversation with her spouse who is her caretaker.  Followup will be  p.r.n.      Leonie Man, M.D.  Electronically Signed     PB/MEDQ  D:  12/28/2008  T:  12/29/2008  Job:  [id number]

## 2010-10-31 NOTE — Assessment & Plan Note (Signed)
[facility]   NAME:  [name], [name]              ACCOUNT NO.:  [id number]   MEDICAL RECORD NO.:  [id number]      DATE OF BIRTH:  July 07, 1923   PHYSICIAN:  Leonie Man, M.D.    VISIT DATE:  12/21/2008                                   OFFICE VISIT   [name] is an 76 year old lady who with Alzheimer disease and  Parkinson disease and associated venous stasis disease with an ulcer  over the anterior and superior portion of her right leg.  She presented  on December 01, 2008, when she was then seen by Dr. Jimmey Ralph.  He placed on  hydrogel dressing and to be changed every day and for return in 2 weeks.  She is here now for reevaluation.  The ulcer is healing quite  satisfactorily under current therapy, although she has not been wearing  a stocking.  The ulcer measures 0.3 x 0.4 x 0.1 cm.  The surrounding  skin shows no evidence of erythema.  There is no odor or drainage.   Her vital signs are stable, temperature 98.0, pulse 76, respirations 20,  and blood pressure 151/89.   TREATMENTS:  Treatments today will consist of hydrogel and Allevyn pad.  I have given her prescription for 20 mm at the ankle compression  stocking and a prescription for Bactroban to be used b.i.d.  Follow up  will be in 1 week.      Leonie Man, M.D.  Electronically Signed     PB/MEDQ  D:  12/21/2008  T:  12/22/2008  Job:  [id number]

## 2010-11-03 NOTE — Assessment & Plan Note (Signed)
[facility]   NAME:  Jill Mckinney, Jill Mckinney              ACCOUNT NO.:  [id number]   MEDICAL RECORD NO.:  [id number]      DATE OF BIRTH:  September 21, 1923   PHYSICIAN:  [name]. [name], M.D. VISIT DATE:  11/15/2005                                     OFFICE VISIT   SUBJECTIVE:  Jill Mckinney is a 77 year old lady referred by Rosalyn Gess.  Norins, M.D. [facility] who was evaluated initially on Nov 08, 2005.  In the  interim, she has been treated with compression wraps for management of  bilateral stasis ulcers.  She denies pain, swelling, or drainage in the  interim.   OBJECTIVE:  VITAL SIGNS:  Stable.  She is afebrile.  Pulse rate 69,  respirations 14.   The wound #1 is a stasis ulceration on the right lateral leg.  This ulcer  shows dramatic improvement with decreased measurements.  A full-thickness  debridement was performed with hemorrhage control with direct pressure.  There is no evidence of infection.   Wound #2 is on the left anterior leg.  This wound is completely resolved at  this point, but there is a persistence of 2-3+ edema bilaterally.   Wound #3 on the right medial aspect of the leg, the ulceration has improved.  The nonviable tissue was debrided without incident to viable bleeding tissue  that was controlled with direct pressure.   ASSESSMENT:  Overall improvement.   PLAN:  We will continue bilateral compression wraps and reevaluate in 1  week.           ______________________________  [name]. [name], M.D.     Jill Mckinney  D:  11/15/2005  T:  11/16/2005  Job:  [id number]

## 2010-11-03 NOTE — Op Note (Signed)
NAME:  Jill Mckinney, Jill Mckinney NO.:  [id number]   MEDICAL RECORD NO.:  [id number]                   PATIENT TYPE:  OIB   LOCATION:  NA                                   FACILITY:  [facility]   PHYSICIAN:  Gita Kudo, M.D.              DATE OF BIRTH:  [birth year]   DATE OF PROCEDURE:  01/18/2003  DATE OF DISCHARGE:                                 OPERATIVE REPORT   PREOPERATIVE DIAGNOSIS:  Gallstones.   POSTOPERATIVE DIAGNOSIS:  Gallstones, normal cholangiogram.   OPERATION PERFORMED:  Laparoscopic cholecystectomy with intraoperative  cholangiogram.   SURGEON:  Gita Kudo, M.D.   ASSISTANT:  Sharlet Salina T. Hoxworth, M.D.   ANESTHESIA:  General endotracheal.   INDICATIONS FOR PROCEDURE:  The patient is a 77 year old female with  multiple medical problems but stable and symptomatic from gallstones.  Brought in for elective surgery.   OPERATIVE FINDINGS:  The patient had a thin-walled gallbladder, the cystic  duct and artery appeared normal as did the cholangiogram.  It was not  acutely inflamed.   DESCRIPTION OF PROCEDURE:  Under satisfactory general endotracheal  anesthesia, having received 400mg  Cipro IV, the patient's abdomen prepped  and draped in standard fashion.  All skin incisions were infiltrated with  Marcaine before making them.  A midline incision was made below the  umbilicus and carried into the peritoneal cavity.  Controlled with a figure-  of-eight 0 Vicryl suture and operating Hasson port inserted.  Good CO2  pneumoperitoneum established, camera placed, and under direct vision, two #5  ports placed laterally and a second #10 medially.  Graspers in the lateral  port gave excellent exposure and operating through the medial port, we  carefully dissected the cystic duct gallbladder junction and then  circumferentially dissected the cystic duct and cystic artery.  When certain  of the anatomy, a single clip was placed on the duct, near  the gallbladder  and the artery was controlled with multiple clips. The artery was divided  and an incision made in the cystic duct.  Catheter placed.  A cholangiogram  was obtained which looked good.  Catheter removed and the cystic duct  controlled with multiple clips and divided.  The gallbladder was then  removed from below upward using coagulating hook cautery for both dissection  and hemostasis.  A small hole was made in the gallbladder with spillage of  some clear bile.  The dissection was completed and then a large EndoCatch  bag placed into the abdomen and the gallbladder put in it and secured.  The  abdomen was then lavaged with saline and suctioned dry and the camera moved  to the upper port.  Through the lower port, a large grasper used to retrieve  the gallbladder intact in the bag and without further spillage or  complications.  Then the operative site was lavaged with a second liter of  fluid and suctioned dry.  All ports and CO2 released.  The midline closed  with a previously placed figure-of-eight as well as a second interrupted 0  Vicryl.  A single 0 Vicryl was placed in the fascia at the medial incision and then  skin approximated with 4-0 Vicryl and Steri-Strips.  Sterile absorbent  dressings were applied and the patient went to the recovery room from the  operating room in good condition without complication.                                                Gita Kudo, M.D.    MRL/MEDQ  D:  01/18/2003  T:  01/18/2003  Job:  [id number]   cc:   Wilson Singer, M.D.  [address]  [place]  [place] [address]  Fax: [phone]

## 2010-11-03 NOTE — Op Note (Signed)
[place]  Patient:    Jill Mckinney, Jill Mckinney                       MRN: [id number] Proc. Date: 08/03/99 Adm. Date:  [id number] Attending:  Colbert Ewing                           Operative Report  PREOPERATIVE DIAGNOSIS:  Chronic retracted right rotator cuff tear with chronic  impingement and degenerative joint disease, acromioclavicular joint.  POSTOPERATIVE DIAGNOSIS:  Chronic retracted right rotator cuff tear with chronic impingement and degenerative joint disease, acromioclavicular joint with irreparable rotator cuff tear.  Partial tearing of the long head of the biceps tendon.  OPERATION PERFORMED:  Right shoulder examination under anesthesia, arthroscopy ith extensive debridement of rotator cuff and partial debridement of biceps tendon.  Bursectomy, arthroscopic acromioplasty.  Distal clavicle excision.  SURGEON:  Loreta Ave, M.D.  ASSISTANT:  Arlys John D. Petrarca, P.A.-C.  ANESTHESIA:  General.  ESTIMATED BLOOD LOSS:  Minimal.  SPECIMENS:  None.  CULTURES:  None.  COMPLICATIONS:  None.  DRESSING:  Soft compressive with sling.  DESCRIPTION OF PROCEDURE:  The patient was brought to the operating room and after adequate anesthesia had been obtained, right arm examined.  Full motion, stable  shoulder.  Placed in a beach chair position on the shoulder positioner, prepped and draped in the usual sterile fashion.  Three standard arthroscopic portals anterior, posterior and lateral.  Shoulder entered with a blunt obturator, distended and inspected.  Extensive chronic tearing of the entire supraspinatus tendon with retraction.  Fibrillation and marked attrition.  Partial tearing of about a third of the substance of the biceps tendon.  Both of these thoroughly assessed. Biceps debrided.  The cuff was irreparable and debrided.  The glenohumeral joint, labrum, articular cartilage, capsular ligamentous structures otherwise  looked good. Cannula redirected subacromially.  Cuff again assessed from above and no repair was feasible.  Debrided back to a stable surface at the level of the glenoid which included debridement of the top of the subscap and infraspinatus but I still had the humeral head depressor with the biceps tendon.  Type 2 acromion recognized ith attritional changes on the bottom of this.  Acromioplasty converted to a type 1  acromion with a shaver and high speed bur.  CA ligament appropriately released o accomplish this.  Distal clavicle had marked spurring inferiorly contributing to impingement and also grade 4 changes at the [facility] joint.  Lateral centimeter sharply resected with shaver and high speed bur.  Adequacy of decompression confirmed viewing from all portals as well as debridement of the cuff confirmed in the same manner.  Instruments and fluid removed.  Portals of shoulder and bursa injected  with Marcaine.  Portals closed with 4-0 nylon.  Sterile compressive dressing applied.  Anesthesia reversed.  Brought to recovery room.  Tolerated surgery well. No complications. DD:  08/03/99 TD:  08/03/99 Job: [id number]

## 2010-11-03 NOTE — Assessment & Plan Note (Signed)
[facility]   NAME:  Jill Mckinney, Jill Mckinney              ACCOUNT NO.:  [id number]   MEDICAL RECORD NO.:  [id number]      DATE OF BIRTH:  1924-03-03   PHYSICIAN:  [name]. [name], M.D. VISIT DATE:  11/23/2005                                     OFFICE VISIT   Jill Mckinney is a 77 year old lady who was seen for follow-up of blunt trauma  to her lower extremities, aggravated by fluid retention, resulting in stasis  ulcers.  She has been treated with Unna boots, compression wraps.  She  presents complaining of the wraps interfering with her ambulation.   OBJECTIVE:  Vital signs are stable, she is afebrile.  Blood pressure is  100/60, pulse rate 68, respirations 18.  She is afebrile.   Examination of the lower extremities shows that all the ulcers are  completely healed.  They are 100% re-epithelialized.   ASSESSMENT:  Resolved wounds.   PLAN:  We will discharge the patient from the clinic.  We will follow up on  a p.r.n. basis.           ______________________________  [name] [name], M.D.     [name]  D:  11/23/2005  T:  11/24/2005  Job:  [id number]
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
## 2011-03-13 LAB — URINALYSIS, ROUTINE W REFLEX MICROSCOPIC
Hgb urine dipstick: NEGATIVE
Nitrite: NEGATIVE
Protein, ur: 30 — AB
Specific Gravity, Urine: 1.027
Urobilinogen, UA: 1

## 2011-03-13 LAB — CBC
HCT: 35.3 — ABNORMAL LOW
Hemoglobin: 13.4
MCHC: 35.3
MCV: 97.9
Platelets: 210
Platelets: 219
RBC: 3.89
RDW: 14.9
RDW: 14.9
WBC: 14.9 — ABNORMAL HIGH
WBC: 8.3

## 2011-03-13 LAB — CK TOTAL AND CKMB (NOT AT ARMC)
CK, MB: 4.8 — ABNORMAL HIGH
Relative Index: 1.2

## 2011-03-13 LAB — BASIC METABOLIC PANEL
BUN: 17
Creatinine, Ser: 0.74
GFR calc Af Amer: >60
GFR calc non Af Amer: >60
Potassium: 4.1

## 2011-03-13 LAB — DIFFERENTIAL
Basophils Relative: 0
Lymphs Abs: 1.6
Monocytes Absolute: 1.3 — ABNORMAL HIGH
Monocytes Relative: 9
Neutro Abs: 12 — ABNORMAL HIGH

## 2011-03-13 LAB — POCT I-STAT, CHEM 8
BUN: 20
Chloride: 107
Creatinine, Ser: 0.9
Glucose, Bld: 114 — ABNORMAL HIGH
Potassium: 5
Sodium: 140

## 2011-03-13 LAB — CARDIAC PANEL(CRET KIN+CKTOT+MB+TROPI)
Relative Index: 0.9
Total CK: 247 — ABNORMAL HIGH
Troponin I: 0.08 — ABNORMAL HIGH

## 2011-03-13 LAB — POCT CARDIAC MARKERS
CKMB, poc: 3.3
Myoglobin, poc: 362
Troponin i, poc: <0.05

## 2011-03-13 LAB — URINE MICROSCOPIC-ADD ON

## 2011-04-19 ENCOUNTER — Emergency Department (HOSPITAL_COMMUNITY): Payer: Medicare Other

## 2011-04-19 ENCOUNTER — Inpatient Hospital Stay (HOSPITAL_COMMUNITY)
Admission: EM | Admit: 2011-04-19 | Discharge: 2011-04-25 | DRG: 690 | Disposition: A | Discharge status: Home Health | Payer: Medicare Other | Source: Ambulatory Visit | Attending: Internal Medicine | Admitting: Internal Medicine

## 2011-04-19 DIAGNOSIS — F3289 Other specified depressive episodes: Secondary | ICD-10-CM | POA: Diagnosis present

## 2011-04-19 DIAGNOSIS — H9190 Unspecified hearing loss, unspecified ear: Secondary | ICD-10-CM | POA: Diagnosis present

## 2011-04-19 DIAGNOSIS — Z7982 Long term (current) use of aspirin: Secondary | ICD-10-CM

## 2011-04-19 DIAGNOSIS — N390 Urinary tract infection, site not specified: Principal | ICD-10-CM | POA: Diagnosis present

## 2011-04-19 DIAGNOSIS — E860 Dehydration: Secondary | ICD-10-CM | POA: Diagnosis present

## 2011-04-19 DIAGNOSIS — F0390 Unspecified dementia without behavioral disturbance: Secondary | ICD-10-CM | POA: Diagnosis present

## 2011-04-19 DIAGNOSIS — A498 Other bacterial infections of unspecified site: Secondary | ICD-10-CM | POA: Diagnosis present

## 2011-04-19 DIAGNOSIS — K5904 Chronic idiopathic constipation: Secondary | ICD-10-CM

## 2011-04-19 DIAGNOSIS — M810 Age-related osteoporosis without current pathological fracture: Secondary | ICD-10-CM | POA: Diagnosis present

## 2011-04-19 DIAGNOSIS — Z79899 Other long term (current) drug therapy: Secondary | ICD-10-CM

## 2011-04-19 DIAGNOSIS — H543 Unqualified visual loss, both eyes: Secondary | ICD-10-CM | POA: Diagnosis present

## 2011-04-19 DIAGNOSIS — Z8659 Personal history of other mental and behavioral disorders: Secondary | ICD-10-CM | POA: Insufficient documentation

## 2011-04-19 DIAGNOSIS — I10 Essential (primary) hypertension: Secondary | ICD-10-CM

## 2011-04-19 DIAGNOSIS — I2510 Atherosclerotic heart disease of native coronary artery without angina pectoris: Secondary | ICD-10-CM | POA: Diagnosis present

## 2011-04-19 DIAGNOSIS — H3090 Unspecified chorioretinal inflammation, unspecified eye: Secondary | ICD-10-CM | POA: Diagnosis present

## 2011-04-19 DIAGNOSIS — K5900 Constipation, unspecified: Secondary | ICD-10-CM | POA: Diagnosis present

## 2011-04-19 DIAGNOSIS — F329 Major depressive disorder, single episode, unspecified: Secondary | ICD-10-CM | POA: Diagnosis present

## 2011-04-19 DIAGNOSIS — F29 Unspecified psychosis not due to a substance or known physiological condition: Secondary | ICD-10-CM

## 2011-04-19 LAB — URINALYSIS, ROUTINE W REFLEX MICROSCOPIC
Bilirubin Urine: NEGATIVE
Glucose, UA: NEGATIVE mg/dL
Ketones, ur: 15 mg/dL — AB
Protein, ur: 100 mg/dL — AB

## 2011-04-19 LAB — BASIC METABOLIC PANEL
BUN: 45 mg/dL — ABNORMAL HIGH (ref 6–23)
CO2: 24 mEq/L (ref 19–32)
Calcium: 11.2 mg/dL — ABNORMAL HIGH (ref 8.4–10.5)
Creatinine, Ser: 1.07 mg/dL (ref 0.50–1.10)
Glucose, Bld: 132 mg/dL — ABNORMAL HIGH (ref 70–99)

## 2011-04-19 LAB — URINE MICROSCOPIC-ADD ON

## 2011-04-20 DIAGNOSIS — R7309 Other abnormal glucose: Secondary | ICD-10-CM

## 2011-04-20 DIAGNOSIS — K922 Gastrointestinal hemorrhage, unspecified: Secondary | ICD-10-CM

## 2011-04-20 LAB — PHOSPHORUS: Phosphorus: 3.8 mg/dL (ref 2.3–4.6)

## 2011-04-20 MED ORDER — CHLORHEXIDINE GLUCONATE 0.12 % MT SOLN
10.0000 mL | Freq: Two times a day (BID) | OROMUCOSAL | Status: DC
  Administered 2011-04-22: 10 mL via OROMUCOSAL
  Administered 2011-04-22: 15 mL via OROMUCOSAL
  Administered 2011-04-23: 11:00:00 via OROMUCOSAL
  Administered 2011-04-23: 10 mL via OROMUCOSAL
  Administered 2011-04-24: 21:00:00 via OROMUCOSAL
  Filled 2011-04-20 (×12): qty 15

## 2011-04-20 MED ORDER — MIRTAZAPINE 15 MG PO TABS
15.0000 mg | ORAL_TABLET | Freq: Every day | ORAL | Status: DC
  Administered 2011-04-22 – 2011-04-25 (×4): 15 mg via ORAL
  Filled 2011-04-20 (×6): qty 1

## 2011-04-20 MED ORDER — DEXTROSE-NACL 5-0.9 % IV SOLN
INTRAVENOUS | Status: DC
  Administered 2011-04-22 – 2011-04-24 (×3): via INTRAVENOUS

## 2011-04-20 MED ORDER — SERTRALINE HCL 100 MG PO TABS
100.0000 mg | ORAL_TABLET | Freq: Every day | ORAL | Status: DC
  Administered 2011-04-22 – 2011-04-25 (×4): 100 mg via ORAL
  Filled 2011-04-20 (×6): qty 1

## 2011-04-20 MED ORDER — CALCIUM CITRATE 950 (200 CA) MG PO TABS
200.0000 mg | ORAL_TABLET | Freq: Two times a day (BID) | ORAL | Status: DC
  Administered 2011-04-22 – 2011-04-25 (×7): 200 mg via ORAL
  Filled 2011-04-20 (×12): qty 1

## 2011-04-20 MED ORDER — ENOXAPARIN SODIUM 40 MG/0.4ML ~~LOC~~ SOLN
40.0000 mg | Freq: Every day | SUBCUTANEOUS | Status: DC
  Administered 2011-04-22 – 2011-04-25 (×4): 40 mg via SUBCUTANEOUS
  Filled 2011-04-20 (×5): qty 0.4

## 2011-04-20 MED ORDER — DEXTROSE 5 % IV SOLN
1.0000 g | INTRAVENOUS | Status: DC
  Filled 2011-04-20: qty 1

## 2011-04-20 MED ORDER — ASPIRIN 81 MG PO CHEW
81.0000 mg | CHEWABLE_TABLET | Freq: Every day | ORAL | Status: DC
  Administered 2011-04-22 – 2011-04-25 (×4): 81 mg via ORAL
  Filled 2011-04-20 (×3): qty 1

## 2011-04-20 MED ORDER — FLEET ENEMA 7-19 GM/118ML RE ENEM
1.0000 | ENEMA | Freq: Every day | RECTAL | Status: DC | PRN
  Filled 2011-04-20: qty 1

## 2011-04-20 MED ORDER — BISACODYL 10 MG RE SUPP: 10.0000 mg | Freq: Every day | RECTAL | Status: DC | PRN

## 2011-04-20 MED ORDER — DONEPEZIL HCL 10 MG PO TABS
10.0000 mg | ORAL_TABLET | Freq: Every day | ORAL | Status: DC
  Administered 2011-04-22 – 2011-04-24 (×3): 10 mg via ORAL
  Filled 2011-04-20 (×6): qty 1

## 2011-04-20 MED ORDER — MEMANTINE HCL 10 MG PO TABS
10.0000 mg | ORAL_TABLET | Freq: Two times a day (BID) | ORAL | Status: DC
  Administered 2011-04-22 – 2011-04-25 (×7): 10 mg via ORAL
  Filled 2011-04-20 (×10): qty 1

## 2011-04-20 MED ORDER — HALOPERIDOL 0.5 MG PO TABS
0.5000 mg | ORAL_TABLET | Freq: Three times a day (TID) | ORAL | Status: DC | PRN
  Administered 2011-04-22 – 2011-04-25 (×2): 0.5 mg via ORAL
  Filled 2011-04-20 (×2): qty 1

## 2011-04-21 LAB — URINE CULTURE
Colony Count: >=100000
Culture  Setup Time: 201211012135

## 2011-04-22 DIAGNOSIS — N390 Urinary tract infection, site not specified: Secondary | ICD-10-CM

## 2011-04-22 DIAGNOSIS — K5904 Chronic idiopathic constipation: Secondary | ICD-10-CM

## 2011-04-22 HISTORY — DX: Urinary tract infection, site not specified: N39.0

## 2011-04-22 LAB — CBC
HCT: 36.8 % (ref 36.0–46.0)
MCH: 32.6 pg (ref 26.0–34.0)
MCHC: 34 g/dL (ref 30.0–36.0)
RDW: 15 % (ref 11.5–15.5)

## 2011-04-22 MED ORDER — CEFUROXIME AXETIL 250 MG PO TABS
250.0000 mg | ORAL_TABLET | Freq: Two times a day (BID) | ORAL | Status: DC
  Administered 2011-04-22 – 2011-04-25 (×5): 250 mg via ORAL
  Filled 2011-04-22 (×8): qty 1

## 2011-04-22 MED ORDER — LORAZEPAM 2 MG/ML IJ SOLN
0.5000 mg | Freq: Four times a day (QID) | INTRAMUSCULAR | Status: DC | PRN
Start: 2011-04-22 — End: 2011-04-25
  Administered 2011-04-22 – 2011-04-25 (×7): 0.5 mg via INTRAVENOUS
  Filled 2011-04-22 (×7): qty 1

## 2011-04-22 MED ORDER — LORAZEPAM 2 MG/ML IJ SOLN
INTRAMUSCULAR | Status: AC
  Administered 2011-04-22: 0.5 mg via INTRAVENOUS
  Filled 2011-04-22: qty 1

## 2011-04-22 NOTE — Progress Notes (Signed)
Subjective: Patinet is awakd and seems calm. No evidence of pain or discomfort. Per Rn she did receive fleets x 2 and MOM with only small amount of stool passage that was hard in nature.  Objective: Vital signs in last 24 hours: Temp:  [97.3 F (36.3 C)-97.5 F (36.4 C)] 97.3 F (36.3 C) (11/04 0426) Pulse Rate:  [94-96] 96  (11/04 0426) Resp:  [16-18] 18  (11/04 0426) BP: (147-153)/(81-86) 153/86 mmHg (11/04 0426) SpO2:  [95 %-96 %] 96 % (11/04 0426) Weight change:  Last BM Date: 04/21/11  Intake/Output from previous day: 11/03 0701 - 11/04 0700 In: 2025 [I.V.:2025] Out: 8 [Urine:8]  PE: Vitals noted Elderly woman in no acute distress but not oriented to place or context Chest- clear Cor - RRR Abdomen - BS+, no guarding or rebound, soft  Lab Results:  [facility] 04/22/11 0610  WBC 7.8  HGB 12.5  HCT 36.8  PLT 323   BMET  Basename 04/19/11 2028  NA 139  K 3.7  CL 99  CO2 24  GLUCOSE 132*  BUN 45*  CREATININE 1.07  CALCIUM 11.2*   Lab Results  Component Value Date   ALT 18 08/21/2010   AST 27 08/21/2010   ALKPHOS 55 08/21/2010   BILITOT 0.4 08/21/2010    Lab Results  Component Value Date   TSH 4.446 04/20/2011   No results found for this basename: HGBA1C    Studies/Results: No results found.   Assessment/Plan: Patient Active Hospital Problem List: DEMENTIA, HX OF (06/02/2007)   Assessment:  At her baseline with no change   Plan:  Continue supportive care and sitters for safety   UTI (lower urinary tract infection) (04/22/2011)   Assessment: stable with no fever. Day #4 Rocephi   Plan: Will d/c rocephin            Start ceftin 250 mg po bid   Constipation (04/22/2011)   Assessment: Small BM. Abdomen is soft   Plan: continue milk of mag bid      Illene Regulus 04/22/2011, 11:33 AM

## 2011-04-23 ENCOUNTER — Inpatient Hospital Stay (HOSPITAL_COMMUNITY): Payer: Medicare Other

## 2011-04-23 NOTE — Progress Notes (Signed)
  Subjective:    Patient ID: Jill Mckinney, female    DOB: Dec 26, 1923, 77 y.o.   MRN: [id number]  HPI Comments: Subjective: Patient is awake but non-verbal. In no distress. Staff reports BM this AM. No events reported and no behavior problems  Objective: Vital signs in last 24 hours: Temp:  (97.7 F (36.5 C)) 97.7 F (36.5 C) (11/05 0515) Pulse Rate:  (71) 71  (11/05 0515) Resp:  (18) 18  (11/05 0515) BP: (155)/(101) 155/101 mmHg (11/05 0515) SpO2:  (95 %) 95 % (11/05 0515) Weight change:  Last BM Date: 04/22/11 (Small, formed) . No  Physical Exam: Vitals noted Chest - good breath sounds Cor - RRR Abd- BS+ x 4, soft but tender to palpation  Intake/Output from previous day: 11/04 0701 - 11/05 0700 In: 1331.2 (P.O.:190; I.V.:1141.2) Out: 1 (Urine:1)   Assessment/Plan:  DEMENTIA, HX OF (06/02/2007)   Assessment: No change in condition. No behavioral problems   Plan: continue present meds  UTI (lower urinary tract infection) (04/22/2011)   Assessment: Day #5 Rocephin, afebrile and at mental status baseline   Plan: continue IV antibiotics  Constipation (04/22/2011)   Assessment: Has had BM but not large volume. Is continuing to get Milk of Mag   Plan: Will get follow-up 2 view abdomen     Illene Regulus 04/23/2011, 6:42 AM        Review of Systems     Objective:   Physical Exam        Assessment & Plan:

## 2011-04-23 NOTE — Progress Notes (Signed)
Ativan 0.5mg IV given .

## 2011-04-24 DIAGNOSIS — I10 Essential (primary) hypertension: Secondary | ICD-10-CM

## 2011-04-24 HISTORY — DX: Essential (primary) hypertension: I10

## 2011-04-24 LAB — URINALYSIS, ROUTINE W REFLEX MICROSCOPIC
Ketones, ur: NEGATIVE mg/dL
Protein, ur: NEGATIVE mg/dL
Urobilinogen, UA: 0.2 mg/dL (ref 0.0–1.0)

## 2011-04-24 LAB — URINE MICROSCOPIC-ADD ON

## 2011-04-24 MED ORDER — AMLODIPINE BESYLATE 5 MG PO TABS
5.0000 mg | ORAL_TABLET | Freq: Every day | ORAL | Status: DC
  Administered 2011-04-24 – 2011-04-25 (×2): 5 mg via ORAL
  Filled 2011-04-24 (×2): qty 1

## 2011-04-24 NOTE — Progress Notes (Signed)
  Subjective:    Patient ID: Jill Mckinney, female    DOB: [birth year], 77 y.o.   MRN: [id number]  HPI Comments: Subjective: Asleep and did not awaken during exam. Per RN report - no BM over night, last was 11/5 AM. She has not been agitated except when "wet."  Objective: Vital signs in last 24 hours: Temp:  (96.5 F (35.8 C)-98 F (36.7 C)) 97.1 F (36.2 C) (11/06 0500) Pulse Rate:  (85-110) 110  (11/06 0500) Resp:  (16-18) 18  (11/06 0500) BP: (143-167)/(76-104) 167/104 mmHg (11/06 0500) SpO2:  (95 %-97 %) 97 % (11/06 0500) Weight change:  Last BM Date: 04/23/11  Physical Exam: Gen'l - very elderly white woman who is resting comfortably Chest - anterior auscultation - clear lungs Cor - RRR, II/VI murmur Abd - BS+, no guarding or rebound  Intake/Output from previous day: 11/05 0701 - 11/06 0700 In: 1035 (P.O.:150; I.V.:885) Out: 3 (Urine:1; Stool:2)   Assessment/Plan:  DEMENTIA, HX OF (06/02/2007)   Assessment: no change in condition   Plan: no change  UTI (lower urinary tract infection) (04/22/2011)   Assessment: #6 Rocephin. No fever.   Plan: follow-up CBC tonight            U/A  Constipation (04/22/2011)   Assessment: has had a BM yesterday. Abdomen is soft   Plan: Continue Milk of mag  Hypertension - has been running an elevated BP this admission    Assessment - elevated BP    Plan - will start amlodipine 5 mg daily              Monitor BP  Dispo - will be looking to d/c home 11/7       Illene Regulus 04/24/2011, 6:32 AM        Review of Systems     Objective:   Physical Exam        Assessment & Plan:

## 2011-04-25 LAB — DIFFERENTIAL
Eosinophils Absolute: 0.4 10*3/uL (ref 0.0–0.7)
Eosinophils Relative: 5 % (ref 0–5)
Lymphocytes Relative: 31 % (ref 12–46)
Lymphs Abs: 2.7 10*3/uL (ref 0.7–4.0)
Monocytes Absolute: 0.9 10*3/uL (ref 0.1–1.0)
Monocytes Relative: 11 % (ref 3–12)

## 2011-04-25 LAB — CBC
HCT: 37.1 % (ref 36.0–46.0)
Hemoglobin: 12.7 g/dL (ref 12.0–15.0)
MCH: 32.6 pg (ref 26.0–34.0)
MCV: 95.1 fL (ref 78.0–100.0)
RBC: 3.9 MIL/uL (ref 3.87–5.11)
WBC: 8.5 10*3/uL (ref 4.0–10.5)

## 2011-04-25 LAB — BASIC METABOLIC PANEL
CO2: 28 mEq/L (ref 19–32)
Calcium: 9.5 mg/dL (ref 8.4–10.5)
Chloride: 110 mEq/L (ref 96–112)
Glucose, Bld: 93 mg/dL (ref 70–99)
Sodium: 146 mEq/L — ABNORMAL HIGH (ref 135–145)

## 2011-04-25 MED ORDER — LORAZEPAM 0.5 MG PO TABS: 0.5000 mg | ORAL_TABLET | Freq: Four times a day (QID) | ORAL | Status: AC | PRN

## 2011-04-25 MED ORDER — INFLUENZA VIRUS VACC SPLIT PF IM SUSP
0.5000 mL | INTRAMUSCULAR | Status: AC | PRN
  Administered 2011-04-25: 0.5 mL via INTRAMUSCULAR
  Filled 2011-04-25 (×2): qty 0.5

## 2011-04-25 MED ORDER — MAGNESIUM HYDROXIDE 400 MG/5ML PO SUSP: 15.0000 mL | Freq: Every day | ORAL | Status: AC

## 2011-04-25 MED ORDER — AMLODIPINE BESYLATE 5 MG PO TABS: 5.0000 mg | ORAL_TABLET | Freq: Every day | ORAL | Status: DC

## 2011-04-25 NOTE — Progress Notes (Signed)
  Subjective:    Patient ID: Jill Mckinney, female    DOB: 01-15-1924, 77 y.o.   MRN: [id number]  HPI Comments: Patient for discharge today. Dictated #[id number]     Review of Systems     Objective:   Physical Exam        Assessment & Plan:

## 2011-04-25 NOTE — Progress Notes (Signed)
Met with pt and daughter, Lupita Leash, spoke with daughter Darl Pikes, who assist with care in the home. Per Darl Pikes, she and her father, pt's husband, plan to take pt home. Pt currently requiring sitter, and 2+ assist to get OOB. Pt husband is currently active with [facility] due to cardiac issues however plans to be caregiver for wife. Daughter, Darl Pikes can assist in evening and at night. Darl Pikes ask that [facility] also provide care for patient. [facility] contacted and HHRN, HHPT, HHOT, HH aide and social worker requested. Daughter Lupita Leash uncomfortable with plan. This CM concerned for family's ability to provide care for this confused and restless pt who currently requires two person assist to get OOB.  Daughter Darl Pikes states that hopefully pt will become more alert once she is home and no longer getting medications administered in the hospital

## 2011-04-25 NOTE — Progress Notes (Signed)
Pt has 3:1, wheelchair and walker. No equipment needs per family.

## 2011-04-25 NOTE — Discharge Summary (Signed)
Jill Mckinney, Jill Mckinney              ACCOUNT NO.:  [id number]  MEDICAL RECORD NO.:  [id number]  LOCATION:  4503                         FACILITY:  [facility]  PHYSICIAN:  [name], MD  DATE OF BIRTH:  Mar 25, 1924  DATE OF ADMISSION:  04/19/2011 DATE OF DISCHARGE:  04/25/2011                              DISCHARGE SUMMARY   ADMITTING DIAGNOSES: 1. Obstipation. 2. Urinary tract infection.  DISCHARGE DIAGNOSES: 1. Obstipation resolved. 2. Urinary tract infection, treatment completed with followup UA being     improved. 3. Dementia. 4. Mental status changes. 5. Hypertension.  HISTORY OF PRESENT ILLNESS:  [name] is a an 77 year old woman living at home with her husband and daughter.  She has advanced dementia, coronary artery disease.  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  She has had difficulty with bowel movements.  She was brought to the emergency department because of abnormal behavior with increasing agitation.  In the emergency department, she was found to have a calcium level of 11.2, creatinine 1.07, blood glucose of 132.  Abdominal film showed increased stool in the distal colon.  Urinalysis was markedly positive.  Please see the H and P for past medical history, family history, social history, and admission examination.  HOSPITAL COURSE: 1. Obstipation.  The patient was treated with laxatives, Fleet Enema.     On this regimen, she did have several bowel movements.  She had a     followup chest x-ray on April 23, 2011, which revealed that she     had marked improvement with resolution of obstipation, no evidence     of obstruction.  Plan:  The patient is to take a daily laxative     such as milk of magnesia or Senokot to ensure good bowel habit. 2. UTI.  The patient presented with a significant urinary infection.     Urine culture from admission was greater than 100,000 colonies of     E. coli that was pansensitive.  The  patient was treated with IV     ceftriaxone for a full 7-day course.  Her white blood count     remained normal with final CBC on April 22, 2011, with a white     count of 7800.  The patient has completed her antibiotics and at     this point is medically stable in regards to her infection and able     to be discharged home. 3. Dementia.  The patient has severe dementia and she gets disoriented     very easily.  Being blind and deaf contributes to her problems.     Her husband has been her full-time caretaker along with her     daughter and they have been getting along reasonably well.  The     patient is not able to go to a skilled care facility due to     financial concerns.  I have spoken with her husband and daughter on     April 24, 2011, and they are prepared to take her home and resume     her care.  They did have some in-home assistance periodically.  We  will arrange for Home Health Nursing as well as Home Health Aide     Services. 4. Hypertension.  The patient's blood pressure has been stable     although she has had some excretions during her hospitalization.     At discharge, blood pressure was 172/98.  We will continue her home     medications for this for management plus amlodipine 5 mg was added     to her regimen. 5. The patient's UTI being treated successfully with the patient     having cleared her obstipation.  At this point, she is ready for     discharge to home.  DISCHARGE EXAMINATION:  VITAL SIGNS:  Temperature was 96.8, blood pressure 172/98 with prior reading 131/76, heart rate was 94, respirations 20. GENERAL APPEARANCE:  This is a very elderly woman looking older than her stated age, who is mildly agitated at the time of my exam. HEENT:  The patient is edentulous with no oral lesions.  Conjunctivae and sclerae were clear. CHEST:  The patient is moving air well with no rales or wheezes. CARDIOVASCULAR:  2+ radial pulse.  Her precordium was quiet.  She  had a regular rate and rhythm. ABDOMEN:  Soft with positive bowel sounds.  No guarding or rebound was noted.  No further examination conducted.  FINAL LABORATORY:  CBC from April 22, 2011, with a white count of 7800, hemoglobin 12.5 g, platelet count 323,000.  Chemistries from April 19, 2011, with a sodium of 139, potassium 3.7, chloride 99, CO2 24, BUN was 45, creatinine 1.05, calcium was 11.2, glucose 132, phosphorus was 3.8, magnesium was 2.3, alkaline phosphatase 55, albumin 4.1.  AST was 27, ALT was 18, total protein was 6.5, total bilirubin 0.4.  FINAL IMAGING:  Diagnostic x-rays of the abdomen as noted.  DISPOSITION:  The patient is to be discharged home.  MEDICATIONS:  The patient will resume her home medications including: 1. Aspirin 81 mg daily. 2. B complex. 3. Calcium with vitamin D daily. 4. Flexeril 10 mg q.6 p.r.n. 5. Aricept 10 mg daily. 6. Folic acid 400 mcg daily. 7. Namenda 10 mg b.i.d. 8. Remeron 15 mg at bedtime. 9. Sertraline 100 mg daily. 10.The patient to be given milk of magnesia 15 mL daily.  NEW MEDICATIONS:  Amlodipine 5 mg daily.  We will also prescribe Ativan 0.5 mg to be taken q.6 p.r.n. significant agitation.  DISPOSITION:  The patient to be discharged home.  We will request Home Health Nursing to see the patient twice a week for followup of her urinary tract infection and bowel habits.  The patient will also need Aide Services 3 times a week to help with ADLs.  CODE STATUS:  The patient is a full code at this time.  CONDITION AT THE TIME OF DISCHARGE:  Guarded given her advanced age and multiple comorbidities.  The patient will be seen in the office for followup in 7-10 days.     Rosalyn Gess Norins, MD     MEN/MEDQ  D:  04/25/2011  T:  04/25/2011  Job:  [id number]

## 2011-04-25 NOTE — Progress Notes (Signed)
Reassessed patient after administering PO haldol at 1550, haldol was effective and patient resting quietly. Lesli Albee, RN

## 2011-04-25 NOTE — Progress Notes (Signed)
Patient given 0.5mg  of Haldol for agitation per order at 1550. Lesli Albee, RN

## 2011-04-25 NOTE — Progress Notes (Signed)
CNA  Called nurse to room.  She reported that pt. Had pulled left toe nail off.  Pt's toe nail was bloody.  Toenail area cleaned with normal saline.  4 by 4 gauze applied and Kerlix wrap applied.  Pt. Tolerated well.

## 2011-04-26 DIAGNOSIS — F068 Other specified mental disorders due to known physiological condition: Secondary | ICD-10-CM

## 2011-04-26 DIAGNOSIS — K5900 Constipation, unspecified: Secondary | ICD-10-CM

## 2011-04-26 DIAGNOSIS — N390 Urinary tract infection, site not specified: Secondary | ICD-10-CM

## 2011-04-26 NOTE — H&P (Signed)
Jill Mckinney, Jill Mckinney NO.:  [id number]  MEDICAL RECORD NO.:  [id number]  LOCATION:  4503                         FACILITY:  [facility]  PHYSICIAN:  Houston Siren, MD           DATE OF BIRTH:  [birth year]  DATE OF ADMISSION:  04/19/2011 DATE OF DISCHARGE:                             HISTORY & PHYSICAL   PRIMARY CARE PHYSICIAN:  [name], MD  ADVANCE DIRECTIVES:  Full code.  REASON FOR ADMISSION:  Obstipation, hypercalcemia, and urinary tract infection.  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  She herself has advanced dementia and offered no complaints.  She is quite agitated, yelled out meaningless things in her room.  History was taken from medical record and through the ER physician.  Evaluation in the emergency room showed hypercalcemia with a calcium level of 11.2, and creatinine of 1.07. She has a potassium of 3.7 and a blood glucose of 132.  An abdominal film showed increased stool in her distal half of her colon.  A urinalysis shows too numerous to count wbc's with many bacteria and a positive leukocyte esterase.  After the resident disimpacted part of her stool, hospitalist was asked to admit the patient for further bowel regimen and to treat her urinary tract infection.  PAST MEDICAL HISTORY: 1. Advanced dementia. 2. Coronary artery disease. 3. 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 4. Osteoporosis. 5. Depression.  ALLERGIES:  No known drug allergies.  CURRENT MEDICATIONS:  Aspirin, Namenda, Aricept, sertraline, cyclobenzaprine, mirtazapine, and multivitamin along with calcium and vitamin D.  FAMILY HISTORY:  Noncontributory.  SOCIAL HISTORY:  She does not smoke, drink, or uses any illicit drugs.  PHYSICAL EXAMINATION:   VITAL SIGNS:  Blood pressure 154/65, pulse of 54, respiratory rate of 18, temp 98.7. GENERAL:  She is confused, yelling out telling her daughter to get our, although she is in the room by herself.  She does have fluent speech and facial symmetry. CARDIAC:  S1 and S2, regular. LUNGS:  Clear. ABDOMEN:  Nondistended, nontender.  Bowel sounds present.  No evidence of ascites or palpable mass. EXTREMITIES:  No edema.  She is able to move all 4 extremities.  No peripheral sign of shock. SKIN:  Warm and dry.  OBJECTIVE FINDINGS:  Urinalysis with too numerous to count wbc's, many bacteria.  Serum sodium 139, potassium 3.7, chloride 99, bicarb of 24, creatinine 1.07, glucose 132.  Film of her abdomen shows entry colon in the distal half of her colon.  IMPRESSION:  This is an 77 year old with advanced dementia, unable to give any history, admitted for obstipation, mild elevated calcium, and urinary tract infection.  We will admit and will start her on bowel regimen.  I will give her half a bottle of magnesium citrate and some intravenous fluids.  Her serum calcium is elevated, and her calcium and vitamin D supplements will be stopped.  For her urinary tract infection, we will treat her with Rocephin.  She is stable otherwise, and we will  admit her to Dr. Debby Bud' service.  As per prior arrangement, he will resume her care this morning.  Message was left to notify the PCP of this admission.     Houston Siren, MD     PL/MEDQ  D:  04/20/2011  T:  04/20/2011  Job:  [id number]  Electronically Signed by Houston Siren  on 04/26/2011 03:00:38 PM

## 2011-06-14 ENCOUNTER — Encounter: Payer: Self-pay | Admitting: Internal Medicine

## 2011-06-18 ENCOUNTER — Ambulatory Visit (INDEPENDENT_AMBULATORY_CARE_PROVIDER_SITE_OTHER): Payer: Medicare Other | Admitting: Internal Medicine

## 2011-06-18 DIAGNOSIS — K5900 Constipation, unspecified: Secondary | ICD-10-CM

## 2011-06-18 DIAGNOSIS — I10 Essential (primary) hypertension: Secondary | ICD-10-CM

## 2011-06-18 DIAGNOSIS — Z8659 Personal history of other mental and behavioral disorders: Secondary | ICD-10-CM

## 2011-06-18 DIAGNOSIS — K900 Celiac disease: Secondary | ICD-10-CM

## 2011-06-18 NOTE — Patient Instructions (Signed)
On exam today she has a nice soft abdomen without distention, without a ropey nature or feel of a full colon. It is not necessary to have a BM every day. At least once a week may be enough. As long as her abdomen is not distended, firm or ropey feeling she is most likely OK. Continue with fruits, ensure and 1/2 or 1 capful of Milk of magnesia.  Review her labs from Nov and there is no need to repeat labs today.   Blood pressure is OK, if not just a little low.  OK to continue her current medications.  Dementia - seems to be stable. Continue her present medications.

## 2011-06-18 NOTE — Progress Notes (Signed)
  Subjective:    Patient ID: Jill Mckinney, female    DOB: 06/11/1924, 77 y.o.   MRN: [id number]  HPI Jill Mckinney, a very demented white woman presents for follow-up. She was hospitalized for abdominal pain due to obstipation. The biggest problem is irregular bowel habit - despite MOM daily. Her husband reports that she goes to the Bathroom all the time and flushes so he doesn't know if she has had a BM. By his report she is eating: ensure, canned fruit, etc. No change in here behavior or mental state. No falls or injuries. Her daughter lives in the home and helps with her care as does in-home aid several times a week.  Past Medical History  Diagnosis Date  . CATARACT EXTRACTION, HX OF 06/02/2007  . Celiac disease 06/02/2007  . CHEST WALL PAIN, ANTERIOR 11/24/2009  . Constipation 04/22/2011  . DEMENTIA, HX OF 06/02/2007  . FREQUENCY, URINARY 11/26/2008  . HEARING LOSS 06/02/2007  . Hypertension 04/24/2011  . INSOMNIA, CHRONIC 08/21/2010  . OSTEOPOROSIS 06/02/2007  . Other acquired absence of organ 06/02/2007  . ROTATOR CUFF REPAIR, HX OF 06/02/2007  . STASIS ULCER 06/02/2007  . TACHYARRHYTHMIA 06/02/2007  . Unspecified psychosis 06/02/2007  . UTI (lower urinary tract infection) 04/22/2011  . VITAMIN B12 DEFICIENCY 06/02/2007   Past Surgical History  Procedure Date  . Cardiac electrophysiology mapping and ablation   . Hand surgery   . Rotator cuff repair   . Excision large mole removed from back & breast   . Cholecystectomy   . Tonsillectomy   . Orif ankle dislocation   . Cataract extraction    Family History  Problem Relation Age of Onset  . Stroke Mother   . Lung cancer Father   . Breast cancer Maternal Aunt    History   Social History  . Marital Status: Married    Spouse Name: N/A    Number of Children: N/A  . Years of Education: N/A   Occupational History  . Not on file.   Social History Main Topics  . Smoking status: Not on file  . Smokeless tobacco: Not on  file  . Alcohol Use: Not on file  . Drug Use: Not on file  . Sexually Active: Not on file   Other Topics Concern  . Not on file   Social History Narrative   Married '483 daughters- '40, '55, '59Lives with husband. Daughter moved in few months ago to help parents, to cook for her mom (celiac dz)       Review of Systems System review is negative for any constitutional, cardiac, pulmonary, GI or neuro symptoms or complaints other than as described in the HPI.     Objective:   Physical Exam Vitals reviewed - normal Gen'l- Elderly white woman, thin, blind, minimally responsive, non-conversant, in no distress HEENT- edentulous, C&S clear, blind Chest - good breath sounds, no rales or wheeze Cor - RRR Abd - BS+ x 4, soft, no guarding or rebound, no palpable fullness to the colon - not ropey. No masses appreciated.       Assessment & Plan:

## 2011-06-19 NOTE — Assessment & Plan Note (Signed)
Abdominal exam is normal. Bowel history is inaccurate. Reassured husband that she doesn't have to have a BM daily, that he should continue her present diet, continue MOM daily. Watch for abdominal distention, tenderness or decreased appetite.

## 2011-06-19 NOTE — Assessment & Plan Note (Signed)
She is on a gluten free diet. Her husband is very conscientious.

## 2011-06-19 NOTE — Assessment & Plan Note (Signed)
BP Readings from Last 3 Encounters:  06/18/11 92/58  04/25/11 162/83  08/21/10 120/68   Low BP noted today. She is asymptomatic.  Plan - continue present regimen

## 2011-06-19 NOTE — Assessment & Plan Note (Signed)
No change in her condition. Mr. Minnifield, with the help of his daughter manage her well at home. No behavioral problems.

## 2011-06-21 DIAGNOSIS — R269 Unspecified abnormalities of gait and mobility: Secondary | ICD-10-CM

## 2011-06-21 DIAGNOSIS — K900 Celiac disease: Secondary | ICD-10-CM

## 2011-06-21 DIAGNOSIS — H548 Legal blindness, as defined in USA: Secondary | ICD-10-CM

## 2011-06-21 DIAGNOSIS — K5900 Constipation, unspecified: Secondary | ICD-10-CM

## 2011-08-13 ENCOUNTER — Telehealth: Payer: Self-pay | Admitting: *Deleted

## 2011-08-13 NOTE — Telephone Encounter (Signed)
Request for Lorazepam 0.5 mg tablet; not on med list in EMR [Epic and/or Centricity]. Please advise.

## 2011-08-13 NOTE — Telephone Encounter (Signed)
oik for lorazepam 0.5 mg q 6 prn agitation. #60 with 5 refills. Please update med list.

## 2011-08-14 MED ORDER — LORAZEPAM 0.5 MG PO TABS: 0.5000 mg | ORAL_TABLET | Freq: Four times a day (QID) | ORAL | Status: AC | PRN

## 2011-08-14 NOTE — Telephone Encounter (Signed)
Done

## 2012-01-01 ENCOUNTER — Telehealth: Payer: Self-pay | Admitting: *Deleted

## 2012-01-01 NOTE — Telephone Encounter (Signed)
Patient reqeusts refill on lorazepam. Last OV 06/18/2011

## 2012-01-02 NOTE — Telephone Encounter (Signed)
Ok x 5 

## 2012-01-03 ENCOUNTER — Other Ambulatory Visit: Payer: Self-pay | Admitting: *Deleted

## 2012-01-03 MED ORDER — LORAZEPAM 0.5 MG PO TABS: 0.5000 mg | ORAL_TABLET | Freq: Four times a day (QID) | ORAL | Status: DC | PRN

## 2012-01-03 NOTE — Telephone Encounter (Signed)
Refill on lorazepam called to [facility]

## 2012-01-17 ENCOUNTER — Encounter (HOSPITAL_COMMUNITY): Payer: Self-pay

## 2012-01-17 ENCOUNTER — Emergency Department (HOSPITAL_COMMUNITY)
Admission: EM | Admit: 2012-01-17 | Discharge: 2012-01-17 | Disposition: A | Discharge status: Home / Self Care | Payer: Medicare Other | Attending: Emergency Medicine | Admitting: Emergency Medicine

## 2012-01-17 ENCOUNTER — Emergency Department (HOSPITAL_COMMUNITY): Payer: Medicare Other

## 2012-01-17 DIAGNOSIS — R3915 Urgency of urination: Secondary | ICD-10-CM

## 2012-01-17 DIAGNOSIS — R300 Dysuria: Secondary | ICD-10-CM | POA: Insufficient documentation

## 2012-01-17 DIAGNOSIS — Z880 Allergy status to penicillin: Secondary | ICD-10-CM | POA: Insufficient documentation

## 2012-01-17 DIAGNOSIS — N3941 Urge incontinence: Secondary | ICD-10-CM | POA: Insufficient documentation

## 2012-01-17 DIAGNOSIS — F0390 Unspecified dementia without behavioral disturbance: Secondary | ICD-10-CM | POA: Insufficient documentation

## 2012-01-17 DIAGNOSIS — I10 Essential (primary) hypertension: Secondary | ICD-10-CM | POA: Insufficient documentation

## 2012-01-17 DIAGNOSIS — Z882 Allergy status to sulfonamides status: Secondary | ICD-10-CM | POA: Insufficient documentation

## 2012-01-17 DIAGNOSIS — R32 Unspecified urinary incontinence: Secondary | ICD-10-CM | POA: Insufficient documentation

## 2012-01-17 LAB — COMPREHENSIVE METABOLIC PANEL
ALT: 14 U/L (ref 0–35)
Alkaline Phosphatase: 51 U/L (ref 39–117)
CO2: 28 mEq/L (ref 19–32)
Chloride: 100 mEq/L (ref 96–112)
GFR calc Af Amer: 89 mL/min — ABNORMAL LOW (ref >90)
Glucose, Bld: 94 mg/dL (ref 70–99)
Potassium: 3.5 mEq/L (ref 3.5–5.1)
Sodium: 142 mEq/L (ref 135–145)
Total Protein: 7.4 g/dL (ref 6.0–8.3)

## 2012-01-17 LAB — CBC WITH DIFFERENTIAL/PLATELET
Eosinophils Absolute: 0 10*3/uL (ref 0.0–0.7)
Lymphocytes Relative: 21 % (ref 12–46)
Lymphs Abs: 1.4 10*3/uL (ref 0.7–4.0)
Neutro Abs: 4.7 10*3/uL (ref 1.7–7.7)
Neutrophils Relative %: 70 % (ref 43–77)
Platelets: 289 10*3/uL (ref 150–400)
RBC: 4.28 MIL/uL (ref 3.87–5.11)
WBC: 6.7 10*3/uL (ref 4.0–10.5)

## 2012-01-17 LAB — GRAM STAIN

## 2012-01-17 LAB — URINALYSIS, ROUTINE W REFLEX MICROSCOPIC
Bilirubin Urine: NEGATIVE
Ketones, ur: 15 mg/dL — AB
Nitrite: NEGATIVE
pH: 6 (ref 5.0–8.0)

## 2012-01-17 NOTE — ED Notes (Signed)
Pt husband stated that he would like for pt to try and use the bathroom again with his assistance before going forward with the in and out cath. Pt was able to get a clean catch. No in and out cath performed.

## 2012-01-17 NOTE — ED Notes (Signed)
Pt was not in the waiting room for Vital signs to be taken.

## 2012-01-17 NOTE — ED Provider Notes (Signed)
History     CSN: [id number]  Arrival date & time 01/17/12  1221   First MD Initiated Contact with Patient 01/17/12 1507      No chief complaint on file.   (Consider location/radiation/quality/duration/timing/severity/associated sxs/prior treatment) Patient is a 78 y.o. female presenting with dysuria. The history is provided by the spouse.  Dysuria  This is a new problem. The current episode started 6 to 12 hours ago. The problem occurs every urination. The problem has not changed since onset.The quality of the pain is described as burning. The pain is moderate. There has been no fever. Associated symptoms include urgency. Pertinent negatives include no vomiting and no hematuria. Associated symptoms comments: Increased confusion. She has tried nothing for the symptoms. Her past medical history is significant for recurrent UTIs.    Past Medical History  Diagnosis Date  . CATARACT EXTRACTION, HX OF 06/02/2007  . Celiac disease 06/02/2007  . CHEST WALL PAIN, ANTERIOR 11/24/2009  . Constipation 04/22/2011  . DEMENTIA, HX OF 06/02/2007  . FREQUENCY, URINARY 11/26/2008  . HEARING LOSS 06/02/2007  . Hypertension 04/24/2011  . INSOMNIA, CHRONIC 08/21/2010  . OSTEOPOROSIS 06/02/2007  . Other acquired absence of organ 06/02/2007  . ROTATOR CUFF REPAIR, HX OF 06/02/2007  . STASIS ULCER 06/02/2007  . TACHYARRHYTHMIA 06/02/2007  . Unspecified psychosis 06/02/2007  . UTI (lower urinary tract infection) 04/22/2011  . VITAMIN B12 DEFICIENCY 06/02/2007    Past Surgical History  Procedure Date  . Cardiac electrophysiology mapping and ablation   . Hand surgery   . Rotator cuff repair   . Excision large mole removed from back & breast   . Cholecystectomy   . Tonsillectomy   . Orif ankle dislocation   . Cataract extraction     Family History  Problem Relation Age of Onset  . Stroke Mother   . Lung cancer Father   . Breast cancer Maternal Aunt     History  Substance Use Topics  . Smoking  status: Not on file  . Smokeless tobacco: Not on file  . Alcohol Use: Not on file    OB History    Grav Para Term Preterm Abortions TAB SAB Ect Mult Living                  Review of Systems  Unable to perform ROS: Dementia  Gastrointestinal: Negative for vomiting.  Genitourinary: Positive for dysuria and urgency. Negative for hematuria.    Allergies  Codeine; Penicillins; and Sulfa antibiotics  Home Medications   Current Outpatient Rx  Name Route Sig Dispense Refill  . AMLODIPINE BESYLATE 5 MG PO TABS Oral Take 1 tablet (5 mg total) by mouth daily. 30 tablet 11  . ASPIRIN EC 81 MG PO TBEC Oral Take 81 mg by mouth daily.      . SUPER B COMPLEX PO Oral Take 1 tablet by mouth daily.      Marland Kitchen CALCIUM CITRATE + PO Oral Take 2 tablets by mouth 2 (two) times daily.      . CYCLOBENZAPRINE HCL 10 MG PO TABS Oral Take 10 mg by mouth every 8 (eight) hours as needed. For pain       . DOCUSATE SODIUM 100 MG PO CAPS Oral Take 100 mg by mouth 2 (two) times daily.      . DONEPEZIL HCL 10 MG PO TABS Oral Take 10 mg by mouth daily.      Marland Kitchen FOLIC ACID 400 MCG PO TABS Oral Take 400 mcg by mouth  daily.      Marland Kitchen LORAZEPAM 0.5 MG PO TABS Oral Take 1 tablet (0.5 mg total) by mouth every 6 (six) hours as needed. For anxiety 120 tablet 5  . MAGNESIUM HYDROXIDE 400 MG/5ML PO SUSP Oral Take by mouth daily.      Marland Kitchen MEMANTINE HCL 10 MG PO TABS Oral Take 10 mg by mouth 2 (two) times daily.      Marland Kitchen MIRTAZAPINE 15 MG PO TABS Oral Take 15 mg by mouth daily.      Carma Leaven M PLUS PO TABS Oral Take 1 tablet by mouth daily.      . SERTRALINE HCL 100 MG PO TABS Oral Take 100 mg by mouth daily.        BP 130/64  Pulse 88  Temp 97.9 F (36.6 C) (Oral)  Resp 20  SpO2 100%  Physical Exam  Nursing note and vitals reviewed. Constitutional: She appears well-developed and well-nourished. No distress.  HENT:  Head: Normocephalic and atraumatic.  Mouth/Throat: Oropharynx is clear and moist and mucous membranes are  normal.  Eyes: Conjunctivae are normal. Pupils are equal, round, and reactive to light.  Neck: Neck supple.  Cardiovascular: Normal rate, regular rhythm, normal heart sounds and intact distal pulses.   Pulmonary/Chest: Effort normal and breath sounds normal. She has no wheezes. She has no rales.  Abdominal: Soft. She exhibits no distension. There is no tenderness.  Musculoskeletal: Normal range of motion.  Neurological: She is alert. GCS eye subscore is 4. GCS verbal subscore is 5. GCS motor subscore is 6.  Skin: Skin is warm and dry. No rash noted.    ED Course  Procedures (including critical care time)  Labs Reviewed  COMPREHENSIVE METABOLIC PANEL - Abnormal; Notable for the following:    Calcium 10.7 (*)     GFR calc non Af Amer 76 (*)     GFR calc Af Amer 89 (*)     All other components within normal limits  URINALYSIS, ROUTINE W REFLEX MICROSCOPIC - Abnormal; Notable for the following:    Ketones, ur 15 (*)     All other components within normal limits  CBC WITH DIFFERENTIAL  GRAM STAIN  URINE CULTURE   Dg Chest 2 View  01/17/2012  *RADIOLOGY REPORT*  Clinical Data: Altered mental status.  CHEST - 2 VIEW  Comparison: 10/04/2007  Findings: Heart is mildly enlarged.  No confluent airspace opacities or effusions.  No acute bony abnormality.  No edema. Changes of prior vertebroplasty in the upper lumbar spine.  IMPRESSION: Cardiomegaly.  No acute findings.  Original Report Authenticated By: Cyndie Chime, M.D.     1. Urinary urgency       MDM  78 yo female with PMHx of dementia and HTN presents for urinary urgency, dysuria, and increased confusion that started this morning.  Pt has a history of recurrent UTI.  No history of fever or vomiting.  AF, VSS, NAD at presentation.  Physical exam as documented above with no abdominal tenderness or CVA tenderness.  Will get labs including urine studies, electrolytes, and CXR.    Labs returned with nml CBC, CMP.  CXR w/o focal  consolidation or signs of pneumonia.  UA negative for UTI.  No signs of infection to explain pt's urinary urgency.  In discussion with pt's spouse she is at baseline mental status.  Recommend close PCP follow-up for further evaluation and recommend pt discuss use of Flexeril with PCP as this could cause increased confusion.  Tx  plan discussed with pt's spouse who voiced understanding and will follow-up.  Return precautions provided.        Cherre Robins, MD 01/18/12 [phone]

## 2012-01-17 NOTE — ED Provider Notes (Signed)
78 year old female with severe dementia was noted to have urinary and fecal incontinence today and to not be in her normal mental state. There is no observed fever, chills, sweats. She's not had any cough. There's been no vomiting or diarrhea. She's been compliant with her medications. On exam, lungs are clear, heart has regular rate and rhythm, abdomen is soft and nontender. There are no focal neurologic findings. Urinalysis is been sent to evaluate for possible UTI, and CBC and metabolic panel will be checked as well as a chest x-ray to rule out occult pneumonia.  Dione Booze, MD 01/17/12 [phone]

## 2012-01-17 NOTE — ED Notes (Signed)
Pt husband would like to be called when a decision has been made on whether or not the pt would be admitted. Nadine Counts [phone]

## 2012-01-17 NOTE — ED Notes (Signed)
Husband reports pt has had altered mental status upon awakening this morning.  He reports pt has been eating well, he reports pt has had urinary urgency.

## 2012-01-18 LAB — URINE CULTURE: Colony Count: 40000

## 2012-01-18 NOTE — ED Provider Notes (Signed)
I saw and evaluated the patient, reviewed the resident's note and I agree with the findings and plan.   Deniyah Dillavou, MD 01/18/12 0053 

## 2012-01-24 ENCOUNTER — Ambulatory Visit (INDEPENDENT_AMBULATORY_CARE_PROVIDER_SITE_OTHER): Payer: Medicare Other | Admitting: Internal Medicine

## 2012-01-24 ENCOUNTER — Encounter: Payer: Self-pay | Admitting: Internal Medicine

## 2012-01-24 VITALS — BP 110/72 | HR 87 | Temp 97.4°F | Resp 14 | Wt 124.0 lb

## 2012-01-24 DIAGNOSIS — Z8659 Personal history of other mental and behavioral disorders: Secondary | ICD-10-CM

## 2012-01-24 DIAGNOSIS — R300 Dysuria: Secondary | ICD-10-CM

## 2012-01-24 MED ORDER — PHENAZOPYRIDINE HCL 200 MG PO TABS: 200.0000 mg | ORAL_TABLET | Freq: Three times a day (TID) | ORAL | Status: AC | PRN

## 2012-01-24 MED ORDER — CIPROFLOXACIN HCL 250 MG PO TABS: 250.0000 mg | ORAL_TABLET | Freq: Two times a day (BID) | ORAL | Status: AC

## 2012-01-24 NOTE — Progress Notes (Signed)
Subjective:    Patient ID: Jill Mckinney, female    DOB: [birth year], 78 y.o.   MRN: [id number]  HPI Mrs. Gainey was seen in the ED Aug 1, '13 for dysuria and frequency. Her exam was difficult but unremarkable. The U/A was negative except for WBCs, Urine culture 40,000 colonies non-specific species, CBC was normal. No treatment given. She has continued to c/o burning and frequency. Her dementia has progressed and she is totally dependent at this time. There is a question of whether she is having neurogenic pain/interstital cystitis.  Past Medical History  Diagnosis Date  . CATARACT EXTRACTION, HX OF 06/02/2007  . Celiac disease 06/02/2007  . CHEST WALL PAIN, ANTERIOR 11/24/2009  . Constipation 04/22/2011  . DEMENTIA, HX OF 06/02/2007  . FREQUENCY, URINARY 11/26/2008  . HEARING LOSS 06/02/2007  . Hypertension 04/24/2011  . INSOMNIA, CHRONIC 08/21/2010  . OSTEOPOROSIS 06/02/2007  . Other acquired absence of organ 06/02/2007  . ROTATOR CUFF REPAIR, HX OF 06/02/2007  . STASIS ULCER 06/02/2007  . TACHYARRHYTHMIA 06/02/2007  . Unspecified psychosis 06/02/2007  . UTI (lower urinary tract infection) 04/22/2011  . VITAMIN B12 DEFICIENCY 06/02/2007   Past Surgical History  Procedure Date  . Cardiac electrophysiology mapping and ablation   . Hand surgery   . Rotator cuff repair   . Excision large mole removed from back & breast   . Cholecystectomy   . Tonsillectomy   . Orif ankle dislocation   . Cataract extraction    Family History  Problem Relation Age of Onset  . Stroke Mother   . Lung cancer Father   . Breast cancer Maternal Aunt    History   Social History  . Marital Status: Married    Spouse Name: N/A    Number of Children: N/A  . Years of Education: N/A   Occupational History  . Not on file.   Social History Main Topics  . Smoking status: Never Smoker   . Smokeless tobacco: Not on file  . Alcohol Use: Not on file  . Drug Use: Not on file  . Sexually Active: Not on  file   Other Topics Concern  . Not on file   Social History Narrative   Married '483 daughters- '40, '55, '59Lives with husband. Daughter moved in few months ago to help parents, to cook for her mom (celiac dz)    Current Outpatient Prescriptions on File Prior to Visit  Medication Sig Dispense Refill  . amLODipine (NORVASC) 5 MG tablet Take 1 tablet (5 mg total) by mouth daily.  30 tablet  11  . aspirin EC 81 MG tablet Take 81 mg by mouth daily.        . B Complex-C (SUPER B COMPLEX PO) Take 1 tablet by mouth daily.        . Calcium Citrate-Vitamin D (CALCIUM CITRATE + PO) Take 2 tablets by mouth 2 (two) times daily.        . cyclobenzaprine (FLEXERIL) 10 MG tablet Take 10 mg by mouth every 8 (eight) hours as needed. For pain         . divalproex (DEPAKOTE SPRINKLE) 125 MG capsule Take 125 mg by mouth 2 (two) times daily.      Marland Kitchen docusate sodium (COLACE) 100 MG capsule Take 100 mg by mouth 2 (two) times daily.        Marland Kitchen donepezil (ARICEPT) 10 MG tablet Take 10 mg by mouth daily.        . folic acid (FOLVITE)  400 MCG tablet Take 400 mcg by mouth daily.        Marland Kitchen LORazepam (ATIVAN) 0.5 MG tablet Take 1 tablet (0.5 mg total) by mouth every 6 (six) hours as needed. For anxiety  120 tablet  5  . magnesium hydroxide (MILK OF MAGNESIA) 400 MG/5ML suspension Take by mouth daily.        . memantine (NAMENDA) 10 MG tablet Take 10 mg by mouth 2 (two) times daily.        . mirtazapine (REMERON) 15 MG tablet Take 15 mg by mouth daily.        . Multiple Vitamins-Minerals (MULTIVITAMINS THER. W/MINERALS) TABS Take 1 tablet by mouth daily.        . sertraline (ZOLOFT) 100 MG tablet Take 100 mg by mouth daily.            Review of Systems System review is negative for any constitutional, cardiac, pulmonary, GI or neuro symptoms or complaints other than as described in the HPI.     Objective:   Physical Exam Filed Vitals:   01/24/12 1317  BP: 110/72  Pulse: 87  Temp: 97.4 F (36.3 C)  Resp: 14     GEn'l- Elderly white woman who is demented, uncomfortable- burning in the bladder area Cor- RRR  PUlm - good breath sounds Abd- no tenderness to percussion over the flanks, no tenderness to palpation of the suprapubic region.        Assessment & Plan:  1. Burning pain and sense of having to micturate constantly may be a mild infection vs inflammation of the bladder lining vs her dementia. All ED records reviewed and there were no abnormalities. Urine culture was ambiguous.  Plan- cipro 250 mg twice a day for 5 days  Pyridium 200 mg three times a day for bladder pain. This will make the urine orange.  For unrelieved symptoms may benefit from GU consult and cystoscopy.

## 2012-01-24 NOTE — Patient Instructions (Addendum)
Burning pain and sense of having to micturate constantly may be a mild infection vs inflammation of the bladder lining vs her dementia. All ED records reviewed and there were no abnormalities. Urine culture was ambiguous.  Plan- cipro 250 mg twice a day for 5 days  Pyridium 200 mg three times a day for bladder pain. This will make the urine orange.  Dementia - progressive decline despite medications.  Plan  Stop the Namenda and see how she does: take once a day for 1 week and then stop.  If she does OK without Namenda, two weeks later stop the Aricept   GI - no specific symptoms or signs to indicate a major GI problem. None the less, if she hasn't see Dr. Matthias Hughs for a long time a follow up is reasonable.  Shingles Vaccine - take Rx to a participating pharmacy.

## 2012-01-24 NOTE — Assessment & Plan Note (Signed)
Dementia - progressive decline despite medications.  Plan  Stop the Namenda and see how she does: take once a day for 1 week and then stop.  If she does OK without Namenda, two weeks later stop the Aricept

## 2012-02-01 ENCOUNTER — Inpatient Hospital Stay (HOSPITAL_COMMUNITY)
Admission: EM | Admit: 2012-02-01 | Discharge: 2012-02-06 | DRG: 689 | Disposition: A | Discharge status: Skilled Nursing Facility | Payer: Medicare Other | Attending: Internal Medicine | Admitting: Internal Medicine

## 2012-02-01 ENCOUNTER — Encounter (HOSPITAL_COMMUNITY): Payer: Self-pay | Admitting: Radiology

## 2012-02-01 ENCOUNTER — Emergency Department (HOSPITAL_COMMUNITY): Payer: Medicare Other

## 2012-02-01 DIAGNOSIS — I10 Essential (primary) hypertension: Secondary | ICD-10-CM | POA: Diagnosis present

## 2012-02-01 DIAGNOSIS — F0390 Unspecified dementia without behavioral disturbance: Secondary | ICD-10-CM | POA: Diagnosis present

## 2012-02-01 DIAGNOSIS — S8010XA Contusion of unspecified lower leg, initial encounter: Secondary | ICD-10-CM | POA: Diagnosis present

## 2012-02-01 DIAGNOSIS — Z8659 Personal history of other mental and behavioral disorders: Secondary | ICD-10-CM

## 2012-02-01 DIAGNOSIS — E860 Dehydration: Secondary | ICD-10-CM | POA: Diagnosis present

## 2012-02-01 DIAGNOSIS — R000 Tachycardia, unspecified: Secondary | ICD-10-CM | POA: Diagnosis present

## 2012-02-01 DIAGNOSIS — E876 Hypokalemia: Secondary | ICD-10-CM | POA: Diagnosis present

## 2012-02-01 DIAGNOSIS — G9341 Metabolic encephalopathy: Secondary | ICD-10-CM | POA: Diagnosis present

## 2012-02-01 DIAGNOSIS — I2510 Atherosclerotic heart disease of native coronary artery without angina pectoris: Secondary | ICD-10-CM | POA: Diagnosis present

## 2012-02-01 DIAGNOSIS — K5900 Constipation, unspecified: Secondary | ICD-10-CM | POA: Diagnosis present

## 2012-02-01 DIAGNOSIS — Z66 Do not resuscitate: Secondary | ICD-10-CM | POA: Diagnosis present

## 2012-02-01 DIAGNOSIS — Z79899 Other long term (current) drug therapy: Secondary | ICD-10-CM

## 2012-02-01 DIAGNOSIS — K5904 Chronic idiopathic constipation: Secondary | ICD-10-CM | POA: Diagnosis present

## 2012-02-01 DIAGNOSIS — R627 Adult failure to thrive: Secondary | ICD-10-CM | POA: Diagnosis present

## 2012-02-01 DIAGNOSIS — N390 Urinary tract infection, site not specified: Principal | ICD-10-CM | POA: Diagnosis present

## 2012-02-01 DIAGNOSIS — H9190 Unspecified hearing loss, unspecified ear: Secondary | ICD-10-CM | POA: Diagnosis present

## 2012-02-01 DIAGNOSIS — G4700 Insomnia, unspecified: Secondary | ICD-10-CM | POA: Diagnosis present

## 2012-02-01 DIAGNOSIS — Z882 Allergy status to sulfonamides status: Secondary | ICD-10-CM

## 2012-02-01 DIAGNOSIS — K900 Celiac disease: Secondary | ICD-10-CM | POA: Diagnosis present

## 2012-02-01 DIAGNOSIS — R55 Syncope and collapse: Secondary | ICD-10-CM | POA: Diagnosis present

## 2012-02-01 DIAGNOSIS — M810 Age-related osteoporosis without current pathological fracture: Secondary | ICD-10-CM | POA: Diagnosis present

## 2012-02-01 DIAGNOSIS — K5641 Fecal impaction: Secondary | ICD-10-CM | POA: Diagnosis present

## 2012-02-01 DIAGNOSIS — Z7982 Long term (current) use of aspirin: Secondary | ICD-10-CM

## 2012-02-01 DIAGNOSIS — M25559 Pain in unspecified hip: Secondary | ICD-10-CM | POA: Diagnosis present

## 2012-02-01 DIAGNOSIS — R4182 Altered mental status, unspecified: Secondary | ICD-10-CM

## 2012-02-01 DIAGNOSIS — E538 Deficiency of other specified B group vitamins: Secondary | ICD-10-CM | POA: Diagnosis present

## 2012-02-01 DIAGNOSIS — Z9181 History of falling: Secondary | ICD-10-CM

## 2012-02-01 DIAGNOSIS — Z886 Allergy status to analgesic agent status: Secondary | ICD-10-CM

## 2012-02-01 DIAGNOSIS — F209 Schizophrenia, unspecified: Secondary | ICD-10-CM | POA: Diagnosis present

## 2012-02-01 DIAGNOSIS — Z880 Allergy status to penicillin: Secondary | ICD-10-CM

## 2012-02-01 LAB — URINALYSIS, ROUTINE W REFLEX MICROSCOPIC
Glucose, UA: NEGATIVE mg/dL
Hgb urine dipstick: NEGATIVE
Specific Gravity, Urine: 1.024 (ref 1.005–1.030)
Urobilinogen, UA: 1 mg/dL (ref 0.0–1.0)

## 2012-02-01 LAB — CBC WITH DIFFERENTIAL/PLATELET
Basophils Absolute: 0 10*3/uL (ref 0.0–0.1)
Basophils Relative: 0 % (ref 0–1)
Eosinophils Absolute: 0 10*3/uL (ref 0.0–0.7)
Eosinophils Relative: 0 % (ref 0–5)
HCT: 33.2 % — ABNORMAL LOW (ref 36.0–46.0)
MCH: 32 pg (ref 26.0–34.0)
MCHC: 34 g/dL (ref 30.0–36.0)
MCV: 94.1 fL (ref 78.0–100.0)
Monocytes Absolute: 1.4 10*3/uL — ABNORMAL HIGH (ref 0.1–1.0)
RDW: 14.5 % (ref 11.5–15.5)

## 2012-02-01 LAB — COMPREHENSIVE METABOLIC PANEL
AST: 34 U/L (ref 0–37)
Albumin: 3.4 g/dL — ABNORMAL LOW (ref 3.5–5.2)
Calcium: 9.2 mg/dL (ref 8.4–10.5)
Creatinine, Ser: 0.75 mg/dL (ref 0.50–1.10)
GFR calc non Af Amer: 75 mL/min — ABNORMAL LOW (ref >90)

## 2012-02-01 LAB — VALPROIC ACID LEVEL: Valproic Acid Lvl: 34.6 ug/mL — ABNORMAL LOW (ref 50.0–100.0)

## 2012-02-01 LAB — URINE MICROSCOPIC-ADD ON

## 2012-02-01 MED ORDER — PHENAZOPYRIDINE HCL 100 MG PO TABS
100.0000 mg | ORAL_TABLET | Freq: Three times a day (TID) | ORAL | Status: DC
  Administered 2012-02-01 – 2012-02-06 (×16): 100 mg via ORAL
  Filled 2012-02-01 (×17): qty 1

## 2012-02-01 MED ORDER — MEMANTINE HCL 10 MG PO TABS
10.0000 mg | ORAL_TABLET | Freq: Every day | ORAL | Status: DC
  Administered 2012-02-01: 10 mg via ORAL
  Filled 2012-02-01 (×2): qty 1

## 2012-02-01 MED ORDER — DOCUSATE SODIUM 100 MG PO CAPS
100.0000 mg | ORAL_CAPSULE | Freq: Two times a day (BID) | ORAL | Status: DC
  Administered 2012-02-01 – 2012-02-06 (×11): 100 mg via ORAL
  Filled 2012-02-01 (×12): qty 1

## 2012-02-01 MED ORDER — MORPHINE SULFATE 2 MG/ML IJ SOLN
1.0000 mg | INTRAMUSCULAR | Status: DC | PRN
  Administered 2012-02-04: 2 mg via INTRAVENOUS
  Filled 2012-02-01: qty 1

## 2012-02-01 MED ORDER — ONDANSETRON HCL 4 MG/2ML IJ SOLN: 4.0000 mg | Freq: Four times a day (QID) | INTRAMUSCULAR | Status: DC | PRN

## 2012-02-01 MED ORDER — ONDANSETRON HCL 4 MG/2ML IJ SOLN: 4.0000 mg | Freq: Three times a day (TID) | INTRAMUSCULAR | Status: DC | PRN

## 2012-02-01 MED ORDER — LORAZEPAM 1 MG PO TABS
0.5000 mg | ORAL_TABLET | Freq: Four times a day (QID) | ORAL | Status: DC | PRN
  Administered 2012-02-02 – 2012-02-05 (×3): 1 mg via ORAL
  Filled 2012-02-01 (×3): qty 1

## 2012-02-01 MED ORDER — ONDANSETRON HCL 4 MG PO TABS: 4.0000 mg | ORAL_TABLET | Freq: Four times a day (QID) | ORAL | Status: DC | PRN

## 2012-02-01 MED ORDER — DIVALPROEX SODIUM 125 MG PO CPSP
125.0000 mg | ORAL_CAPSULE | Freq: Two times a day (BID) | ORAL | Status: DC
  Administered 2012-02-01 – 2012-02-06 (×11): 125 mg via ORAL
  Filled 2012-02-01 (×12): qty 1

## 2012-02-01 MED ORDER — FLEET ENEMA 7-19 GM/118ML RE ENEM
1.0000 | ENEMA | Freq: Every day | RECTAL | Status: DC | PRN
  Filled 2012-02-01: qty 1

## 2012-02-01 MED ORDER — MIRTAZAPINE 15 MG PO TABS
15.0000 mg | ORAL_TABLET | Freq: Every day | ORAL | Status: DC
  Administered 2012-02-01: 15 mg via ORAL
  Filled 2012-02-01 (×3): qty 1

## 2012-02-01 MED ORDER — BISACODYL 10 MG RE SUPP: 10.0000 mg | Freq: Every day | RECTAL | Status: DC | PRN

## 2012-02-01 MED ORDER — SODIUM CHLORIDE 0.9 % IV SOLN
INTRAVENOUS | Status: DC
  Administered 2012-02-01 – 2012-02-06 (×7): via INTRAVENOUS

## 2012-02-01 MED ORDER — ACETAMINOPHEN 650 MG RE SUPP: 650.0000 mg | Freq: Four times a day (QID) | RECTAL | Status: DC | PRN

## 2012-02-01 MED ORDER — FOLIC ACID 0.5 MG HALF TAB
500.0000 ug | ORAL_TABLET | Freq: Every day | ORAL | Status: DC
  Administered 2012-02-01 – 2012-02-06 (×6): 0.5 mg via ORAL
  Filled 2012-02-01 (×6): qty 1

## 2012-02-01 MED ORDER — ASPIRIN EC 81 MG PO TBEC
81.0000 mg | DELAYED_RELEASE_TABLET | Freq: Every day | ORAL | Status: DC
  Administered 2012-02-01 – 2012-02-06 (×6): 81 mg via ORAL
  Filled 2012-02-01 (×7): qty 1

## 2012-02-01 MED ORDER — DEXTROSE 5 % IV SOLN
1.0000 g | INTRAVENOUS | Status: DC
  Administered 2012-02-01 – 2012-02-06 (×6): 1 g via INTRAVENOUS
  Filled 2012-02-01 (×6): qty 10

## 2012-02-01 MED ORDER — ACETAMINOPHEN 325 MG PO TABS: 650.0000 mg | ORAL_TABLET | Freq: Four times a day (QID) | ORAL | Status: DC | PRN

## 2012-02-01 MED ORDER — POTASSIUM CHLORIDE 10 MEQ/100ML IV SOLN
10.0000 meq | INTRAVENOUS | Status: AC
  Administered 2012-02-01 (×4): 10 meq via INTRAVENOUS
  Filled 2012-02-01 (×3): qty 100

## 2012-02-01 MED ORDER — ALUM & MAG HYDROXIDE-SIMETH 200-200-20 MG/5ML PO SUSP: 30.0000 mL | Freq: Four times a day (QID) | ORAL | Status: DC | PRN

## 2012-02-01 MED ORDER — SODIUM CHLORIDE 0.9 % IV SOLN: INTRAVENOUS | Status: DC

## 2012-02-01 MED ORDER — DONEPEZIL HCL 10 MG PO TABS
10.0000 mg | ORAL_TABLET | ORAL | Status: DC
  Administered 2012-02-01 – 2012-02-05 (×3): 10 mg via ORAL
  Filled 2012-02-01 (×4): qty 1

## 2012-02-01 NOTE — ED Notes (Signed)
Pt presents with decrease mobilty and questionable left sided hip pain X 3 days. Per husband pt was assisted to bathroom by husband when she "slipped and was helped down to floor by husband"  Pt has no shortening or deformity. Per husband pt is being treated for a UTI. Pt has a hx of dementia, HOH and blind. Pt is baseline per EMS

## 2012-02-01 NOTE — Care Management Note (Signed)
    Page 1 of 1   02/06/2012     3:58:11 PM   CARE MANAGEMENT NOTE 02/06/2012  Patient:  MARGERITE, IMPASTATO   Account Number:  [id number]  Date Initiated:  02/01/2012  Documentation initiated by:  Letha Cape  Subjective/Objective Assessment:   dx near syncope, uti  admit- lives with spouse.     Action/Plan:   will need pt eval   Anticipated DC Date:  02/06/2012   Anticipated DC Plan:  SKILLED NURSING FACILITY      DC Planning Services  CM consult      Choice offered to / List presented to:             Status of service:  Completed, signed off Medicare Important Message given?   (If response is "NO", the following Medicare IM given date fields will be blank) Date Medicare IM given:   Date Additional Medicare IM given:    Discharge Disposition:  SKILLED NURSING FACILITY  Per UR Regulation:  Reviewed for med. necessity/level of care/duration of stay  If discussed at Long Length of Stay Meetings, dates discussed:    Comments:  02/01/12 14:39 Letha Cape RN, BSN [phone] patient lives with spouse, will need pt eval.   NCM will continue to follow for dc needs.

## 2012-02-01 NOTE — ED Provider Notes (Signed)
History     CSN: [id number]  Arrival date & time 02/01/12  8295   First MD Initiated Contact with Patient 02/01/12 [phone]      Chief Complaint  Patient presents with  . Hip Pain    (Consider location/radiation/quality/duration/timing/severity/associated sxs/prior treatment) Patient is a 78 y.o. female presenting with hip pain. The history is provided by the EMS personnel. The history is limited by the condition of the patient (dementia).  Hip Pain  She has been complaining of hip pain for the last 3 days. She has severe dementia and does not communicate well. There was no known fall. Today, she started to fall as she was getting up to walk to the bathroom, but her husband caught her and let her use to the floor. She has been urinating very frequently. There is no known fever or chills.  Past Medical History  Diagnosis Date  . CATARACT EXTRACTION, HX OF 06/02/2007  . Celiac disease 06/02/2007  . CHEST WALL PAIN, ANTERIOR 11/24/2009  . Constipation 04/22/2011  . DEMENTIA, HX OF 06/02/2007  . FREQUENCY, URINARY 11/26/2008  . HEARING LOSS 06/02/2007  . Hypertension 04/24/2011  . INSOMNIA, CHRONIC 08/21/2010  . OSTEOPOROSIS 06/02/2007  . Other acquired absence of organ 06/02/2007  . ROTATOR CUFF REPAIR, HX OF 06/02/2007  . STASIS ULCER 06/02/2007  . TACHYARRHYTHMIA 06/02/2007  . Unspecified psychosis 06/02/2007  . UTI (lower urinary tract infection) 04/22/2011  . VITAMIN B12 DEFICIENCY 06/02/2007    Past Surgical History  Procedure Date  . Cardiac electrophysiology mapping and ablation   . Hand surgery   . Rotator cuff repair   . Excision large mole removed from back & breast   . Cholecystectomy   . Tonsillectomy   . Orif ankle dislocation   . Cataract extraction     Family History  Problem Relation Age of Onset  . Stroke Mother   . Lung cancer Father   . Breast cancer Maternal Aunt     History  Substance Use Topics  . Smoking status: Never Smoker   . Smokeless  tobacco: Never Used  . Alcohol Use: Not on file    OB History    Grav Para Term Preterm Abortions TAB SAB Ect Mult Living                  Review of Systems  Unable to perform ROS: Dementia    Allergies  Codeine; Penicillins; and Sulfa antibiotics  Home Medications   Current Outpatient Rx  Name Route Sig Dispense Refill  . AMLODIPINE BESYLATE 5 MG PO TABS Oral Take 1 tablet (5 mg total) by mouth daily. 30 tablet 11  . ASPIRIN EC 81 MG PO TBEC Oral Take 81 mg by mouth daily.      . SUPER B COMPLEX PO Oral Take 1 tablet by mouth daily.      Marland Kitchen CALCIUM CITRATE + PO Oral Take 2 tablets by mouth 2 (two) times daily.      . CYCLOBENZAPRINE HCL 10 MG PO TABS Oral Take 10 mg by mouth every 8 (eight) hours as needed. For pain       . DIVALPROEX SODIUM 125 MG PO CPSP Oral Take 125 mg by mouth 2 (two) times daily.    Marland Kitchen DOCUSATE SODIUM 100 MG PO CAPS Oral Take 100 mg by mouth 2 (two) times daily.      . DONEPEZIL HCL 10 MG PO TABS Oral Take 10 mg by mouth daily.      Marland Kitchen  FOLIC ACID 400 MCG PO TABS Oral Take 400 mcg by mouth daily.      Marland Kitchen LORAZEPAM 0.5 MG PO TABS Oral Take 1 tablet (0.5 mg total) by mouth every 6 (six) hours as needed. For anxiety 120 tablet 5  . MAGNESIUM HYDROXIDE 400 MG/5ML PO SUSP Oral Take by mouth daily.      Marland Kitchen MEMANTINE HCL 10 MG PO TABS Oral Take 10 mg by mouth 2 (two) times daily.      Marland Kitchen MIRTAZAPINE 15 MG PO TABS Oral Take 15 mg by mouth daily.      Carma Leaven M PLUS PO TABS Oral Take 1 tablet by mouth daily.      . SERTRALINE HCL 100 MG PO TABS Oral Take 100 mg by mouth daily.        BP 122/60  Temp 97.9 F (36.6 C) (Oral)  Resp 23  SpO2 95%  Physical Exam  Nursing note and vitals reviewed. 78year old female, resting comfortably and in no acute distress. Vital signs are significant for mild tachypnea with respiratory rate of 23. Oxygen saturation is 95%, which is normal. Head is normocephalic and atraumatic. PERRLA. Oropharynx is clear. Neck is nontender and  supple without adenopathy or JVD. Back is nontender and there is no CVA tenderness. Lungs are clear without rales, wheezes, or rhonchi. Chest is nontender. Heart has regular rate and rhythm without murmur. Abdomen is soft, flat, nontender without masses or hepatosplenomegaly and peristalsis is normoactive. Extremities have 1 edema, no cyanosis, full range of motion is present. Venous stasis changes are present. She moans when her legs are put through range of motion, but there is no consistent pattern and no movement were para palpation consistently elicits complaint from the patient. Skin is warm and dry without rash. Neurologic: She is awake, moans but does not speak, does not follow commands, cranial nerves are grossly intact except for blindness, there are no gross motor or sensory deficits.   ED Course  Procedures (including critical care time)  Results for orders placed during the hospital encounter of 02/01/12  CBC WITH DIFFERENTIAL      Component Value Range   WBC 10.4  4.0 - 10.5 K/uL   RBC 3.53 (*) 3.87 - 5.11 MIL/uL   Hemoglobin 11.3 (*) 12.0 - 15.0 g/dL   HCT 16.1 (*) 09.6 - 04.5 %   MCV 94.1  78.0 - 100.0 fL   MCH 32.0  26.0 - 34.0 pg   MCHC 34.0  30.0 - 36.0 g/dL   RDW 40.9  81.1 - 91.4 %   Platelets 194  150 - 400 K/uL   Neutrophils Relative 75  43 - 77 %   Neutro Abs 7.7  1.7 - 7.7 K/uL   Lymphocytes Relative 12  12 - 46 %   Lymphs Abs 1.2  0.7 - 4.0 K/uL   Monocytes Relative 13 (*) 3 - 12 %   Monocytes Absolute 1.4 (*) 0.1 - 1.0 K/uL   Eosinophils Relative 0  0 - 5 %   Eosinophils Absolute 0.0  0.0 - 0.7 K/uL   Basophils Relative 0  0 - 1 %   Basophils Absolute 0.0  0.0 - 0.1 K/uL  COMPREHENSIVE METABOLIC PANEL      Component Value Range   Sodium 144  135 - 145 mEq/L   Potassium 3.1 (*) 3.5 - 5.1 mEq/L   Chloride 106  96 - 112 mEq/L   CO2 27  19 - 32 mEq/L  Glucose, Bld 101 (*) 70 - 99 mg/dL   BUN 20  6 - 23 mg/dL   Creatinine, Ser 1.61  0.50 - 1.10 mg/dL    Calcium 9.2  8.4 - 09.6 mg/dL   Total Protein 6.4  6.0 - 8.3 g/dL   Albumin 3.4 (*) 3.5 - 5.2 g/dL   AST 34  0 - 37 U/L   ALT 17  0 - 35 U/L   Alkaline Phosphatase 43  39 - 117 U/L   Total Bilirubin 0.9  0.3 - 1.2 mg/dL   GFR calc non Af Amer 75 (*) >90 mL/min   GFR calc Af Amer 87 (*) >90 mL/min  URINALYSIS, ROUTINE W REFLEX MICROSCOPIC      Component Value Range   Color, Urine ORANGE (*) YELLOW   APPearance CLOUDY (*) CLEAR   Specific Gravity, Urine 1.024  1.005 - 1.030   pH 7.0  5.0 - 8.0   Glucose, UA NEGATIVE  NEGATIVE mg/dL   Hgb urine dipstick NEGATIVE  NEGATIVE   Bilirubin Urine SMALL (*) NEGATIVE   Ketones, ur 15 (*) NEGATIVE mg/dL   Protein, ur 30 (*) NEGATIVE mg/dL   Urobilinogen, UA 1.0  0.0 - 1.0 mg/dL   Nitrite POSITIVE (*) NEGATIVE   Leukocytes, UA SMALL (*) NEGATIVE  VALPROIC ACID LEVEL      Component Value Range   Valproic Acid Lvl 34.6 (*) 50.0 - 100.0 ug/mL  URINE MICROSCOPIC-ADD ON      Component Value Range   Squamous Epithelial / LPF RARE  RARE   WBC, UA 3-6  <3 WBC/hpf   RBC / HPF 0-2  <3 RBC/hpf   Bacteria, UA FEW (*) RARE   Casts HYALINE CASTS (*) NEGATIVE   Urine-Other MUCOUS PRESENT     Dg Chest 2 View  01/17/2012  *RADIOLOGY REPORT*  Clinical Data: Altered mental status.  CHEST - 2 VIEW  Comparison: 10/04/2007  Findings: Heart is mildly enlarged.  No confluent airspace opacities or effusions.  No acute bony abnormality.  No edema. Changes of prior vertebroplasty in the upper lumbar spine.  IMPRESSION: Cardiomegaly.  No acute findings.  Original Report Authenticated By: Cyndie Chime, M.D.   Dg Hip Bilateral W/pelvis  02/01/2012  *RADIOLOGY REPORT*  Clinical Data: Fall.  Bilateral hip pain.  BILATERAL HIP WITH PELVIS - 4+ VIEW  Comparison: CT scan from 07/16/2008.  Findings: Bones are diffusely demineralized.  A frontal view the pelvis shows no fracture.  SI joints and symphysis pubis are unremarkable.  Arcuate lines of the sacrum are preserved.   Joint space in the hips is symmetric and there is no substantial hypertrophic spurring or subchondral sclerosis in either hip.  AP and frog-leg lateral views of the left hip show no evidence for an acute fracture.  AP and frog-leg lateral views of the right hip are also without evidence of acute bony abnormality.  IMPRESSION: Osteopenia without acute bony findings.  Original Report Authenticated By: ERIC A. MANSELL, M.D.   Images viewed by me.   1. Urinary tract infection   2. Altered mental status       MDM  Possible hip pain, recent UTI. Prior records are reviewed and she was seen on August 1 with questionable UTI, blood culture only showed contaminated urine. She was seen in followup by her PCP who empirically placed her on Cipro. There was a discussion about whether she might be having interstitial cystitis and may need urology consultation.  X-rays are negative for fracture  and chest x-ray shows no pneumonia. Urinalysis shows definite evidence of urinary tract infection. She will be admitted for urinary tract infection and altered mental status. She may need social service consultation for placement on a temporary basis.  Dione Booze, MD 02/01/12 [phone]

## 2012-02-01 NOTE — ED Notes (Signed)
Patient transported to X-ray 

## 2012-02-01 NOTE — H&P (Signed)
PCP: Illene Regulus, MD  Attending Provider:Michael Esther Hardy, MD  Chief Complaint:  Near-syncope  CODE STATUS:  Discussed with daughters at time of admission and they request DO NOT RESUSCITATE  HPI: 78 year old female patient who lives at home with her husband. Has a history of celiac disease, OCD/schizophrenia, advanced dementia, hypertension, and frequent recurrent UTIs with symptomatic dysuria. Recently presented to the emergency department on August 1 for dysuria and urinary frequency. Followed up with her primary care physician on 01/24/2012 and was subsequently started on ciprofloxacin 250 mg twice a day for 5 days as well as Pyridium for 3 days for bladder discomfort. Consideration at that time was given for possible urology consult if symptoms did not improve. Because of the patient's advanced dementia she was unable to contribute to the history but her daughters are at the bedside. They report that over the past few days patient has had progressive decline as evidenced by poor oral intake. Several days ago she fell onto her buttocks with no apparent injuries. Today the husband was trying to get her to the bedside commode where she again slid down and fell to her buttocks. This patient has also had a several month history of increasing daytime hypersomnolence. Over the past few days besides having poor oral intake he she's also been complaining of generalized muscular stiffness and has been refusing to get out of bed. Because of the fall and her continued complaints and altered mentation the family opted to bring her to the emergency department. Workup in the ER revealed a lower than normal blood pressure for this patient as well as a urinalysis that appear to be consistent with urinary tract infection. Upon arrival to the emergency department the patient was complaining of nonspecific hip pain. Hip and pelvic plain x-rays show no evidence of acute traumatic injury. The emergency room physician  requested that hospitalists evaluate the patient for possible admission  Review of Systems:  The patient is unable to provide any accurate information regarding her recent history because of her underlying dementia.   Review of systems has been obtained from the family in and is as follows:   Gen: Positive for anorexia and recent failure to thrive type symptoms as described in the history of present illness, also endorses generalized muscular stiffness but no apparent fevers or chills although patient has been complaining of being cold for for several days Neuro: No recent facial drooping, extremity weakness or slurred speech reported, noted to have increasing hypersomnolence over the past few months. Family reports Dr. Alvera Novel is planning on weaning and discontinuing her Namenda and Aricept ENT: Wears bilateral hearing aids, no issues with choking or coughing with swallowing, no recent sore throat or upper respiratory infection Pulmo: No cough, dyspnea and exertion, shortness of breath, orthopnea or wheezing Cardio: No chest pain, reported tachypalpitations, lower extremity edema, but has had 2 episodes of near syncope in the past week GI: No apparent abdominal pain, nausea vomiting diarrhea, there is a history of chronic constipation, patient has a history of subjective need to have BM immediately following all intake GU: Recurrent history of dysuria symptoms or recurrent UTI, family reports despite patient being instructed repeatedly not to do this she wipes from back to front Musc-Skel: As of today patient complaining of hip pain but family also reports this patient will complain of hip pain if her low back hurts, history of apparent spinal stenosis as well, noted with bruises on posterior calves today after near syncopal episode Psych: With known  end-stage dementia and although patient does have purposeful spontaneous movement she does not appear to understand what you tell her and does not  follow commands, according to the family has a documented history of OCD  and schizophrenia   Past Medical History: Past Medical History  Diagnosis Date  . CATARACT EXTRACTION, HX OF 06/02/2007  . Celiac disease 06/02/2007  . CHEST WALL PAIN, ANTERIOR 11/24/2009  . Constipation 04/22/2011  . DEMENTIA, HX OF 06/02/2007  . FREQUENCY, URINARY 11/26/2008  . HEARING LOSS 06/02/2007  . Hypertension 04/24/2011  . INSOMNIA, CHRONIC 08/21/2010  . OSTEOPOROSIS 06/02/2007  . Other acquired absence of organ 06/02/2007  . ROTATOR CUFF REPAIR, HX OF 06/02/2007  . STASIS ULCER 06/02/2007  . TACHYARRHYTHMIA 06/02/2007  . Unspecified psychosis 06/02/2007  . UTI (lower urinary tract infection) 04/22/2011  . VITAMIN B12 DEFICIENCY 06/02/2007   Past Surgical History  Procedure Date  . Cardiac electrophysiology mapping and ablation   . Hand surgery   . Rotator cuff repair   . Excision large mole removed from back & breast   . Cholecystectomy   . Tonsillectomy   . Orif ankle dislocation   . Cataract extraction     Medications: Prior to Admission medications   Medication Sig Start Date End Date Taking? Authorizing Provider  amLODipine (NORVASC) 5 MG tablet Take 1 tablet (5 mg total) by mouth daily. 04/25/11 04/24/12 Yes Jacques Navy, MD  aspirin EC 81 MG tablet Take 81 mg by mouth daily.     Yes Historical Provider, MD  B Complex-C (SUPER B COMPLEX PO) Take 1 tablet by mouth daily.     Yes Historical Provider, MD  Calcium Citrate-Vitamin D (CALCIUM CITRATE + PO) Take 2 tablets by mouth 2 (two) times daily.     Yes Historical Provider, MD  divalproex (DEPAKOTE SPRINKLE) 125 MG capsule Take 125 mg by mouth 2 (two) times daily.   Yes Historical Provider, MD  docusate sodium (COLACE) 100 MG capsule Take 100 mg by mouth 2 (two) times daily.     Yes Historical Provider, MD  donepezil (ARICEPT) 10 MG tablet Take 10 mg by mouth every other day. Patient is currently weaning off medication.   Yes Historical  Provider, MD  ferrous sulfate 325 (65 FE) MG tablet Take 325 mg by mouth daily with breakfast.   Yes Historical Provider, MD  folic acid (FOLVITE) 400 MCG tablet Take 400 mcg by mouth daily.     Yes Historical Provider, MD  LORazepam (ATIVAN) 0.5 MG tablet Take 0.5-1 mg by mouth every 6 (six) hours as needed. anxiety   Yes Historical Provider, MD  memantine (NAMENDA) 10 MG tablet Take 10 mg by mouth daily.    Yes Historical Provider, MD  mirtazapine (REMERON) 15 MG tablet Take 15 mg by mouth daily.     Yes Historical Provider, MD  phenazopyridine (PYRIDIUM) 100 MG tablet Take 100 mg by mouth 3 (three) times daily. Urinary burning.   Yes Historical Provider, MD    Allergies:   Allergies  Allergen Reactions  . Codeine Nausea And Vomiting  . Penicillins Rash  . Sulfa Antibiotics Rash    Social History: She has never smoked. She has never used smokeless tobacco.This patient lives at home with her husband who is her primary caretaker, both daughters (who work) also assist with the patient's IADLs and ADLs. Both daughters at the bedside today and although the patient does not have a living will both daughters agree that it would be appropriate  for this patient to be a DO NOT RESUSCITATE status  Family History: Family History  Problem Relation Age of Onset  . Stroke Mother   . Lung cancer Father   . Breast cancer Maternal Aunt     Physical Exam: Filed Vitals:   02/01/12 0843 02/01/12 0940 02/01/12 1404 02/01/12 1405  BP: 122/60 114/60 113/71   Pulse:  95 87   Temp: 97.9 F (36.6 C)  98.1 F (36.7 C)   TempSrc: Oral  Oral   Resp: 23 22 20    Height:    5\' 1"  (1.549 m)  Weight:    58.65 kg (129 lb 4.8 oz)  SpO2: 95% 94% 94%    General appearance: appears older than stated age, cachectic, fatigued, no distress and slowed mentation Eyes: negative, symmetrical and PERL, conjunctiva pink, sclera white Throat: lips, mucosa, and tongue dry;  gums normal Resp: clear to auscultation  bilaterally, respiratory effort nonlabored, oxygen saturations 95% on room air Cardio: Slightly Irregular rate and rhythm consistent with sinus rhythm with PAC, loud irregular S4 gallop without murmur or rub, no peripheral edema, IV fluids infusing at 125 cc per hour GI: soft, non-tender; bowel sounds normal; no masses,  no organomegaly Musculoskeletal: extremities normal, atraumatic, no cyanosis or edema, I perform passive range of motion of lower extremities to include hip flexion with only minimal reproduction of bilateral pain in each hip. Upon direct palpation over the hip regions as well as the spine I was unable to reproduce any pain Pulses: 2+ and symmetric in upper and lower extremity Skin: Skin color, texture, turgor normal except for irregular shaped ecchymosis on both calves-left calf having much larger area. No rashes or lesions Neurologic: Awakens but will not open eyes, spontaneous purposeful movement but is unable to follow commands, has nonsensical speech and does not participate with verbal responses when questioned.   Labs on Admission:   [facility] 02/01/12 0945  NA 144  K 3.1*  CL 106  CO2 27  GLUCOSE 101*  BUN 20  CREATININE 0.75  CALCIUM 9.2  MG --  PHOS --    Basename 02/01/12 0945  AST 34  ALT 17  ALKPHOS 43  BILITOT 0.9  PROT 6.4  ALBUMIN 3.4*   No results found for this basename: LIPASE:2,AMYLASE:2 in the last 72 hours  Basename 02/01/12 0945  WBC 10.4  NEUTROABS 7.7  HGB 11.3*  HCT 33.2*  MCV 94.1  PLT 194   No results found for this basename: CKTOTAL:3,CKMB:3,CKMBINDEX:3,TROPONINI:3 in the last 72 hours No results found for this basename: TSH,T4TOTAL,FREET3,T3FREE,THYROIDAB in the last 72 hours No results found for this basename: VITAMINB12:2,FOLATE:2,FERRITIN:2,TIBC:2,IRON:2,RETICCTPCT:2 in the last 72 hours  Radiological Exams on Admission: Dg Chest 2 View  01/17/2012  *RADIOLOGY REPORT*  Clinical Data: Altered mental status.  CHEST - 2 VIEW   Comparison: 10/04/2007  Findings: Heart is mildly enlarged.  No confluent airspace opacities or effusions.  No acute bony abnormality.  No edema. Changes of prior vertebroplasty in the upper lumbar spine.  IMPRESSION: Cardiomegaly.  No acute findings.  Original Report Authenticated By: Cyndie Chime, M.D.   Dg Hip Bilateral W/pelvis  02/01/2012  *RADIOLOGY REPORT*  Clinical Data: Fall.  Bilateral hip pain.  BILATERAL HIP WITH PELVIS - 4+ VIEW  Comparison: CT scan from 07/16/2008.  Findings: Bones are diffusely demineralized.  A frontal view the pelvis shows no fracture.  SI joints and symphysis pubis are unremarkable.  Arcuate lines of the sacrum are preserved.  Joint space in  the hips is symmetric and there is no substantial hypertrophic spurring or subchondral sclerosis in either hip.  AP and frog-leg lateral views of the left hip show no evidence for an acute fracture.  AP and frog-leg lateral views of the right hip are also without evidence of acute bony abnormality.  IMPRESSION: Osteopenia without acute bony findings.  Original Report Authenticated By: ERIC A. MANSELL, M.D.   Reviewed by myself There is large stool burden throughout the abdomen on X ray   Assessment/Plan Principal Problem:  *Near syncope/Dehydration *Likely related to dehydration in setting of elderly female patient who is also on Norvasc *Continue IV fluids at 100 cc per hour and follow electrolyte panel *Hold Norvasc *Allow mobilization only with assistance  Active Problems:  UTI (lower urinary tract infection) *Recent treatment with ciprofloxacin which may likely be ineffective for this patient and she may have a resistant organism *Has received Rocephin in the emergency department and we will continue this medication until cultures are final *Did not have leukocytosis at presentation and likely related to recent treatment with Cipro *I have ordered urine cultures in the event these were not obtained in the emergency  department *I have a standing order for blood cultures for high temperature *Patient fecal impaction also contributing to recurrent urinary tract infections. Family confirms she wipes from back to front. Because her OCD she is reluctant to utilize moist wipes which may help minimize translocation of enteric bacteria to the urinary tract.   Hypertension *Currently has relative hypotension therefore we'll hold usual doses of Norvasc  DEMENTIA, HX OF/ FTT (failure to thrive) in adult/Metabolic encephalopathy/Schizophrenia *Patient appears to be experiencing a progression of her dementia. This is also evidenced by family report that Dr. Debby Bud in this is planning on weaning and discontinuing her Aricept and Namenda since as they report "those medications are not working anymore" *Likely underlying dementia and setting of dehydration and acute urinary tract infection has also fluids the patient's current encephalopathic process *Family reports this patient once she is more alert has problems with getting out of bed unassisted therefore bed alarm would definitely be appropriate and patient may need bedside sitter when she is more alert   Hip pain/Traumatic ecchymosis of lower leg *Exam not consistent with acute traumatic injury and this has been confirmed with at least bilateral hip film *If symptoms persist may need CT or MRI to help clarify   Hypokalemia *IV replete and follow electrolyte panel   VITAMIN B12 DEFICIENCY  Celiac disease    Constipation - functional with fecal impaction - laxatives prn     Disposition: *Admit to medical floor   Junious Silk, ANP pager [phone] 02/01/2012, 4:59 PM    I have personally seen and examined Mrs. Gillian Scarce. I agree with the history and physical, physical exam, assessment and plan as outlined in the physician extenders note above. Briefly this is an 78 year old patient with multiple medical problems who was brought to the emergency room with  altered mental status, fecal impaction, recurrent urinary tract infections, dehydration. Plan for IV fluids, IV antibiotics, disimpaction. Dr. Oliver Barre will assume care Tyller Bowlby

## 2012-02-01 NOTE — ED Notes (Signed)
Report called to [place], Charity fundraiser.

## 2012-02-02 DIAGNOSIS — R55 Syncope and collapse: Secondary | ICD-10-CM

## 2012-02-02 LAB — CBC
Platelets: 235 10*3/uL (ref 150–400)
RBC: 4.03 MIL/uL (ref 3.87–5.11)
RDW: 14.3 % (ref 11.5–15.5)
WBC: 10.6 10*3/uL — ABNORMAL HIGH (ref 4.0–10.5)

## 2012-02-02 LAB — BASIC METABOLIC PANEL
Calcium: 8.8 mg/dL (ref 8.4–10.5)
Creatinine, Ser: 0.55 mg/dL (ref 0.50–1.10)
GFR calc Af Amer: >90 mL/min (ref >90)
GFR calc non Af Amer: 83 mL/min — ABNORMAL LOW (ref >90)
Sodium: 142 mEq/L (ref 135–145)

## 2012-02-02 LAB — URINE CULTURE
Colony Count: NO GROWTH
Culture: NO GROWTH

## 2012-02-02 LAB — TSH: TSH: 4.251 u[IU]/mL (ref 0.350–4.500)

## 2012-02-02 MED ORDER — OLANZAPINE 2.5 MG PO TABS
2.5000 mg | ORAL_TABLET | Freq: Every day | ORAL | Status: DC
  Administered 2012-02-02: 2.5 mg via ORAL
  Filled 2012-02-02 (×2): qty 1

## 2012-02-02 MED ORDER — POTASSIUM CHLORIDE 10 MEQ/100ML IV SOLN
10.0000 meq | INTRAVENOUS | Status: AC
  Administered 2012-02-02 (×3): 10 meq via INTRAVENOUS
  Filled 2012-02-02 (×3): qty 100

## 2012-02-02 MED ORDER — ENOXAPARIN SODIUM 40 MG/0.4ML ~~LOC~~ SOLN
40.0000 mg | SUBCUTANEOUS | Status: DC
  Administered 2012-02-02 – 2012-02-06 (×5): 40 mg via SUBCUTANEOUS
  Filled 2012-02-02 (×6): qty 0.4

## 2012-02-02 NOTE — Progress Notes (Signed)
PT Cancellation Note  Treatment cancelled today due to medical issues with patient which prohibited therapy. Pt currently with K 3.1 which has not begun repletion yet. Pt unable to answer questions or follow commands at this time with no family present. Anticipate SNF for long term care. Will attempt at next date.   Delaney Meigs, PT [phone]

## 2012-02-02 NOTE — Progress Notes (Signed)
Subjective: Jill Mckinney is an unfortunate woman with advanced dementia along with sensory deprivation - blindness, admitte for dehydration, refractory UTI and agitation. She is really more than can be managed at home. She is very agitated and disoriented  Objective: Lab: Lab Results  Component Value Date   WBC 10.6* 02/02/2012   HGB 12.9 02/02/2012   HCT 38.8 02/02/2012   MCV 96.3 02/02/2012   PLT 235 02/02/2012   BMET    Component Value Date/Time   NA 142 02/02/2012 0546   K 3.1* 02/02/2012 0546   CL 106 02/02/2012 0546   CO2 24 02/02/2012 0546   GLUCOSE 123* 02/02/2012 0546   BUN 10 02/02/2012 0546   CREATININE 0.55 02/02/2012 0546   CALCIUM 8.8 02/02/2012 0546   GFRNONAA 83* 02/02/2012 0546   GFRAA >90 02/02/2012 0546     Imaging:  Scheduled Meds:   . aspirin EC  81 mg Oral Daily  . cefTRIAXone (ROCEPHIN) IVPB 1 gram/50 mL D5W  1 g Intravenous Q24H  . divalproex  125 mg Oral BID  . docusate sodium  100 mg Oral BID  . donepezil  10 mg Oral QODAY  . folic acid  500 mcg Oral Daily  . memantine  10 mg Oral Daily  . mirtazapine  15 mg Oral Daily  . phenazopyridine  100 mg Oral TID  . potassium chloride  10 mEq Intravenous Q1 Hr x 4  . DISCONTD: sodium chloride   Intravenous STAT   Continuous Infusions:   . sodium chloride 100 mL/hr at 02/02/12 0113   PRN Meds:.acetaminophen, acetaminophen, alum & mag hydroxide-simeth, bisacodyl, LORazepam, morphine injection, ondansetron (ZOFRAN) IV, ondansetron, sodium phosphate, DISCONTD: ondansetron (ZOFRAN) IV   Physical Exam: Filed Vitals:   02/02/12 0626  BP: 144/81  Pulse: 102  Temp: 98 F (36.7 C)  Resp: 18   No intake or output data in the 24 hours ending 02/02/12 0939 Gen'l- very elderly white woman who is constantly calling out: she is disoriented completely.  HEENT- dry mucus membranes Cor RRR Pulm - normal respirations Abd - distended, quiet, no guarding or rebound Psych- disoriented and  agitated.      Assessment/Plan: 1. Dehydration - getting IV fluids. BUN /Cr normal. Cannot get good output. Plan Continue IV  Daily weights  2. ID - UTI that was resistant to cipro. Day #2 Rocephin  3. HTN - stable  4. Dementia - advanced with agitation. Doubt Namenda or aricept are helping. Plan D/C namenda  Add zyprexa 2.5 mg bid to control agitation.  5. Hip pain - impossible to assess. NO fracture on X-ray  6. Hypokalemia - K remains low  Plan Continue IV K in IVF  K 10 meq IV x 3  7. GI - no report of BM  Plan  MOM 30 cc q 4 until BM then daily  8. Disposition - SNF Plan Social work consult  PT/OT eval   Casimiro Needle [facility] ([facility]) [phone]; [phone]  02/02/2012, 9:34 AM

## 2012-02-03 MED ORDER — OLANZAPINE 2.5 MG PO TABS
2.5000 mg | ORAL_TABLET | Freq: Two times a day (BID) | ORAL | Status: DC
  Administered 2012-02-03 – 2012-02-06 (×7): 2.5 mg via ORAL
  Filled 2012-02-03 (×8): qty 1

## 2012-02-03 MED ORDER — WHITE PETROLATUM GEL
Status: AC
  Administered 2012-02-03: 0.2
  Filled 2012-02-03: qty 5

## 2012-02-03 NOTE — Progress Notes (Signed)
Subjective: Patient asleep-did not awaken since agitation has been an issue.  Objective: Lab: Lab Results  Component Value Date   WBC 10.6* 02/02/2012   HGB 12.9 02/02/2012   HCT 38.8 02/02/2012   MCV 96.3 02/02/2012   PLT 235 02/02/2012   BMET    Component Value Date/Time   NA 142 02/02/2012 0546   K 3.1* 02/02/2012 0546   CL 106 02/02/2012 0546   CO2 24 02/02/2012 0546   GLUCOSE 123* 02/02/2012 0546   BUN 10 02/02/2012 0546   CREATININE 0.55 02/02/2012 0546   CALCIUM 8.8 02/02/2012 0546   GFRNONAA 83* 02/02/2012 0546   GFRAA >90 02/02/2012 0546     Imaging:  Scheduled Meds:   . aspirin EC  81 mg Oral Daily  . cefTRIAXone (ROCEPHIN) IVPB 1 gram/50 mL D5W  1 g Intravenous Q24H  . divalproex  125 mg Oral BID  . docusate sodium  100 mg Oral BID  . donepezil  10 mg Oral QODAY  . enoxaparin (LOVENOX) injection  40 mg Subcutaneous Q24H  . folic acid  500 mcg Oral Daily  . OLANZapine  2.5 mg Oral QHS  . phenazopyridine  100 mg Oral TID  . potassium chloride  10 mEq Intravenous Q1 Hr x 3  . white petrolatum       Continuous Infusions:   . sodium chloride 100 mL/hr at 02/02/12 1006   PRN Meds:.acetaminophen, acetaminophen, bisacodyl, LORazepam, morphine injection, ondansetron (ZOFRAN) IV, ondansetron, sodium phosphate   Physical Exam: Filed Vitals:   02/03/12 0541  BP: 148/86  Pulse: 104  Temp: 98.8 F (37.1 C)  Resp: 18    Intake/Output Summary (Last 24 hours) at 02/03/12 1052 Last data filed at 02/02/12 1300  Gross per 24 hour  Intake    120 ml  Output      0 ml  Net    120 ml   Wt Readings from Last 3 Encounters:  02/03/12 131 lb 1.6 oz (59.467 kg)  01/24/12 124 lb (56.246 kg)  06/18/11 109 lb 6 oz (49.612 kg)   Gen'l- elderly white woman - asleep Cor- RRR Pulm - unlabored respirations, no wheezing abd - BS+       Assessment/Plan: 1. Dehydration - positive fluid balance.  2. ID - Day #3 Rocephin. Urine culture Aug 16th - no growth to  date Plan Continue Rocephin  3. HTN - stable  4. Dementia - seems sedated - an improvement over constant agitation.  Plan Continue present meds including zyprexa  5. Low K - Had 3 runs plus IV K Plan Bmet in AM  7. GI - re report of BM despite MOM  8. SNF   Casimiro Needle [facility] ([phone]; [phone]  02/03/2012, 10:50 AM

## 2012-02-03 NOTE — Progress Notes (Signed)
Physical Therapy Evaluation Patient Details Name: Jill Mckinney MRN: [id number] DOB: 1924-04-11 Today's Date: 02/03/2012 Time: 4098-1191 PT Time Calculation (min): 15 min  PT Assessment / Plan / Recommendation Clinical Impression  78 yo female admitted with multiple falls, advancing dementia, failure to thrive, UTI, encephalopathy presents with decr functional mobility, and overall decline in function; Becoming difficult to handle in home, and to pursue SNF is appropriate; Will pick up for PT on a trial basis, pending ability to follow commands as pt's medical status stabilizes, and hopefully mental status and ability to follow commands improves concurrently    PT Assessment  Patient needs continued PT services    Follow Up Recommendations  Skilled nursing facility    Barriers to Discharge  (No barriers to dc to SNF)      Equipment Recommendations  Defer to next venue    Recommendations for Other Services     Frequency Min 2X/week (on a trial basis)    Precautions / Restrictions Precautions Precautions: Fall Restrictions Weight Bearing Restrictions: No Other Position/Activity Restrictions: Pt is blind   Pertinent Vitals/Pain Grimace, and calling out with all movement      Mobility  Bed Mobility Bed Mobility: Rolling Right;Rolling Left Rolling Right: 1: +1 Total assist Rolling Left: 1: +1 Total assist Details for Bed Mobility Assistance: Requires total assist to roll Right and Left in bed; Lacks initiation with any mobility, with or without cues; Overall, did not resist rolling in bed, or become combative; Worked on bed mobility for assessment of mobility (even if limited), and to assist with hygeine, as pt was incontinent of urine and bowel Transfers Transfers: Not assessed    Exercises     PT Diagnosis: Generalized weakness  PT Problem List: Decreased strength;Decreased range of motion;Decreased activity tolerance;Decreased balance;Decreased mobility;Decreased  coordination;Decreased cognition PT Treatment Interventions: Gait training;Functional mobility training;Therapeutic activities;Therapeutic exercise;Balance training;Neuromuscular re-education;Patient/family education  (Gait training will be contingent upon pt's prior level of function)  PT Goals Acute Rehab PT Goals PT Goal Formulation: Patient unable to participate in goal setting Time For Goal Achievement: 02/17/12 Potential to Achieve Goals: Fair Pt will Roll Supine to Right Side: with min assist;with rail PT Goal: Rolling Supine to Right Side - Progress: Goal set today Pt will Roll Supine to Left Side: with min assist;with rail PT Goal: Rolling Supine to Left Side - Progress: Goal set today Pt will go Supine/Side to Sit: with min assist;with rail PT Goal: Supine/Side to Sit - Progress: Goal set today Pt will Sit at Edge of Bed: with min assist;3-5 min;with unilateral upper extremity support PT Goal: Sit at [place] Of Bed - Progress: Goal set today Pt will go Sit to Supine/Side: with min assist;with rail PT Goal: Sit to Supine/Side - Progress: Goal set today Pt will go Sit to Stand: with mod assist PT Goal: Sit to Stand - Progress: Goal set today Pt will go Stand to Sit: with mod assist PT Goal: Stand to Sit - Progress: Goal set today Pt will Transfer Bed to Chair/Chair to Bed: with mod assist PT Transfer Goal: Bed to Chair/Chair to Bed - Progress: Goal set today  Visit Information  Last PT Received On: 02/03/12 Assistance Needed: +2    Subjective Data  Subjective: "I want to go to sleep" Patient Stated Goal: Unable t state   Prior Functioning  Home Living Lives With: Spouse (per MD notes) Additional Comments: Pt unable to answer questions; Will need a clearer picture of prior level of function from family  Prior Function Level of Independence: Needs assistance Needs Assistance: Bathing;Dressing;Feeding;Grooming;Toileting;Meal Prep;Light Housekeeping;Transfers (Not sure if pt  was ambulatory prior to arrival) Meal Prep: Total Light Housekeeping: Total Comments: While we need a clearer picture of home equipment/situatop/available home assist, I agree with Dr. Arthur Holms that pt is likely becoming too difficult to manage at home, and worth pursuing SNF placement Communication Communication: Receptive difficulties;Expressive difficulties;HOH    Cognition  Overall Cognitive Status: History of cognitive impairments - at baseline (though not exactly sure of baseline) Arousal/Alertness: Lethargic Orientation Level: Disoriented to;Place;Time;Situation Behavior During Session: Restless Cognition - Other Comments: Unable to follow simple commands related to mobility    Extremity/Trunk Assessment Right Upper Extremity Assessment RUE ROM/Strength/Tone: Deficits;Unable to fully assess;Due to impaired cognition RUE ROM/Strength/Tone Deficits: Did not squeeze hand, lift arm when directed to Left Upper Extremity Assessment LUE ROM/Strength/Tone: Deficits;Unable to fully assess;Due to impaired cognition LUE ROM/Strength/Tone Deficits: Did not squeeze hand, lift arm when directed to Right Lower Extremity Assessment RLE ROM/Strength/Tone: Unable to fully assess;Due to impaired cognition Left Lower Extremity Assessment LLE ROM/Strength/Tone: Unable to fully assess;Due to impaired cognition   Balance    End of Session PT - End of Session Equipment Utilized During Treatment:  (bed pad) Activity Tolerance: Other (comment) (Limited by cognition; stopped session with bed mobility as agitation had been a problem in the past) Patient left: in bed;with call bell/phone within reach;with bed alarm set Nurse Communication: Mobility status (hygeine status)  GP     [facility] [place], [place] [phone]  02/03/2012, 12:30 PM

## 2012-02-04 MED ORDER — HALOPERIDOL LACTATE 5 MG/ML IJ SOLN
0.5000 mg | Freq: Once | INTRAMUSCULAR | Status: AC
  Administered 2012-02-04: 0.5 mg via INTRAVENOUS
  Filled 2012-02-04 (×2): qty 0.1

## 2012-02-04 NOTE — Progress Notes (Signed)
Subjective: No nursing notes - quiet day yesterday? Resting quietly - did arouse to stimulation. No evidence of agitation. PT note reviewed.   Objective: Lab: Lab Results  Component Value Date   WBC 10.6* 02/02/2012   HGB 12.9 02/02/2012   HCT 38.8 02/02/2012   MCV 96.3 02/02/2012   PLT 235 02/02/2012   BMET    Component Value Date/Time   NA 142 02/02/2012 0546   K 3.1* 02/02/2012 0546   CL 106 02/02/2012 0546   CO2 24 02/02/2012 0546   GLUCOSE 123* 02/02/2012 0546   BUN 10 02/02/2012 0546   CREATININE 0.55 02/02/2012 0546   CALCIUM 8.8 02/02/2012 0546   GFRNONAA 83* 02/02/2012 0546   GFRAA >90 02/02/2012 0546     Imaging: no new imaging  Scheduled Meds:   . aspirin EC  81 mg Oral Daily  . cefTRIAXone (ROCEPHIN) IVPB 1 gram/50 mL D5W  1 g Intravenous Q24H  . divalproex  125 mg Oral BID  . docusate sodium  100 mg Oral BID  . donepezil  10 mg Oral QODAY  . enoxaparin (LOVENOX) injection  40 mg Subcutaneous Q24H  . folic acid  500 mcg Oral Daily  . haloperidol lactate  0.5 mg Intravenous Once  . OLANZapine  2.5 mg Oral BID  . phenazopyridine  100 mg Oral TID  . white petrolatum      . DISCONTD: OLANZapine  2.5 mg Oral QHS   Continuous Infusions:   . sodium chloride 100 mL/hr at 02/03/12 1751   PRN Meds:.acetaminophen, acetaminophen, bisacodyl, LORazepam, morphine injection, ondansetron (ZOFRAN) IV, ondansetron, sodium phosphate   Physical Exam: Filed Vitals:   02/04/12 0634  BP: 145/76  Pulse: 101  Temp: 98.3 F (36.8 C)  Resp: 18  gen'l- elderly white woman who is somnolent but restfull. HEENT- dry mucus membranes Cor IRIR rate controlled Pulm - unlabored respirations, no rales or wheezes. Abd - soft.       Assessment/Plan: 1. Dehydration - day #3 of IV fluids. I&Os meaningless with no output recorded. Weight up 7 lbs from admission = 3 liters ahead. Plan  Decrease IVF to 50 cc/hr  2. ID - Day #4 Rocephin. No fever. Urine culture no growth Plan Will  complete 5 days of IV rocephin  CBC in AM  3. HTN - good control  4. Dementia - no reports. Seems to be calm and without agitation on zyprexa 2.5 mg bid. Plan Continue zyprexa  D/C aricept  5. Hip pain - no mention of hip pain in PT note  6. Low K  Plan Bmet in AM  7. GI  Constipation - per RN yesterday report patient did have a BM.  Plan Will ask that BMs be recorded where they are retrievable.  8. Dispo - patient is a good candidate for SNF. Have not had a chance to speak with family. PT eval noted. OT pending.  Casimiro Needle [facility] ([phone]; ([phone]  02/04/2012, 7:03 AM

## 2012-02-04 NOTE — Progress Notes (Signed)
Physical Therapy Treatment Patient Details Name: Jill Mckinney MRN: [id number] DOB: [birth year] Today's Date: 02/04/2012 Time: 8469-6295 PT Time Calculation (min): 30 min  PT Assessment / Plan / Recommendation Comments on Treatment Session  Pt admitted with UTI, falls and increasing AMS. Pt able to progress activity today to transfer OOB with 2 person assist. Pt maintains eyes closed and unsure if due to lack of vision or any lethargy as pt talking and moving throughout session. Nursing staff present end of session and notified for mobility. Will continue to follow for trial.     Follow Up Recommendations       Barriers to Discharge        Equipment Recommendations       Recommendations for Other Services    Frequency     Plan Discharge plan remains appropriate;Frequency remains appropriate    Precautions / Restrictions Precautions Precautions: Fall Precaution Comments: blind   Pertinent Vitals/Pain Pt wheezing with sitting EOB with sats 89%, 1L [place] per RN with sats 94% Pt stating back and hip pain but unable to localize and distracted with change in position or movement    Mobility  Bed Mobility Bed Mobility: Rolling Right;Rolling Left;Sitting - Scoot to Edge of Bed;Supine to Sit Rolling Right: 1: +1 Total assist Rolling Left: 1: +1 Total assist Supine to Sit: 1: +1 Total assist;HOB flat Sitting - Scoot to Edge of Bed: 2: Max assist Details for Bed Mobility Assistance: Pt with total assist of pad and extremity positioning to roll with resistance toward left side. Supine to sit pivoted pt's lower body and assited with trunk elevation. Use of pad to pivot to EOB Transfers Transfers: Sit to Stand;Stand to Sit;Stand Pivot Transfers Sit to Stand: 1: +2 Total assist;From bed Sit to Stand: Patient Percentage: 30% Stand to Sit: 1: +2 Total assist;To chair/3-in-1 Stand to Sit: Patient Percentage: 30% Stand Pivot Transfers: 1: +2 Total assist Stand Pivot Transfers: Patient  Percentage: 30% Details for Transfer Assistance: tactile cueing at axilla along with verbal cues to achieve anterior translation for standing with guiding and assist to pivot to right and sit in chair with pt grasping onto therapists, able to advance feet with cueing and assist for weight shifting Ambulation/Gait Ambulation/Gait Assistance: Not tested (comment)    Exercises     PT Diagnosis:    PT Problem List:   PT Treatment Interventions:     PT Goals Acute Rehab PT Goals Pt will Roll Supine to Right Side: with max assist PT Goal: Rolling Supine to Right Side - Progress: Revised due to lack of progress Pt will Roll Supine to Left Side: with max assist PT Goal: Rolling Supine to Left Side - Progress: Revised due to lack of progress Pt will go Supine/Side to Sit: with max assist PT Goal: Supine/Side to Sit - Progress: Revised due to lack of progress Pt will Sit at [facility] of Bed: with supervision;with unilateral upper extremity support PT Goal: Sit at Edge Of Bed - Progress: Updated due to goal met PT Goal: Sit to Stand - Progress: Progressing toward goal PT Goal: Stand to Sit - Progress: Progressing toward goal PT Transfer Goal: Bed to Chair/Chair to Bed - Progress: Progressing toward goal  Visit Information  Last PT Received On: 02/04/12 Assistance Needed: +2 PT/OT Co-Evaluation/Treatment: Yes    Subjective Data  Subjective: "there's more in there"- pt stating while stooling in bed   Cognition  Overall Cognitive Status: History of cognitive impairments - at baseline Arousal/Alertness: Other (comment) (pt  able to be aroused but maintains eyes closed) Orientation Level:  (unable to state)    Balance  Static Sitting Balance Static Sitting - Balance Support: Feet supported Static Sitting - Level of Assistance: 3: Mod assist;5: Stand by assistance Static Sitting - Comment/# of Minutes: pt initially mod assist to sit EOB due to posterior lean after able to achieve midline  balance and maintain 5 min with minguard  End of Session PT - End of Session Equipment Utilized During Treatment: Gait belt Activity Tolerance: Patient tolerated treatment well Patient left: in chair;with call bell/phone within reach;with chair alarm set Nurse Communication: Mobility status   GP     Toney Sang Beth 02/04/2012, 11:10 AM Delaney Meigs, PT [phone]

## 2012-02-04 NOTE — Clinical Social Work Psychosocial (Addendum)
    Clinical Social Work Department BRIEF PSYCHOSOCIAL ASSESSMENT 02/04/2012  Patient:  Jill Mckinney, Jill Mckinney     Account Number:  [id number]     Admit date:  02/01/2012  Clinical Social Worker:  Jacelyn Grip  Date/Time:  02/04/2012 01:40 PM  Referred by:  Physician  Date Referred:  02/04/2012 Referred for  SNF Placement   Other Referral:   Interview type:  Family Other interview type:    PSYCHOSOCIAL DATA Living Status:  FAMILY Admitted from facility:   Level of care:   Primary support name:  Molly Maduro Dalto/spouse/[phone] Primary support relationship to patient:  SPOUSE Degree of support available:   adequate    CURRENT CONCERNS Current Concerns  Post-Acute Placement   Other Concerns:    SOCIAL WORK ASSESSMENT / PLAN CSW received referral for new snf placement. CSW reviewed chart and contacted pt husband via telephone as per chart, pt with advancing dementia. CSW introduced self to pt husband and explained role. CSW discussed the recommendation for SNF at discharge for pt. CSW clarified pt husband questions in regard to long term snf and discussed that only payor for long term snf are Medicaid or privately paying at facility. Pt husband reports that he has previously attempted to apply for Medicaid for pt and pt was not approved. CSW explained process of applying for Medicaid and encouraged pt husband to attempt applicaiton process again. Pt husband discussed that he feels that he and pt daughter would be able to manage pt at home. Pt husband discussed that pt was ambulatory prior to admission and CSW discussed with pt husband the concern for pt and pt husband safety as pt is currently not ambulating and needing two person assist for transfers. Pt husband reports that pt daughter lives with them and assist with pt care, but works in the morning and is not available 24 hours a day. CSW encourage pt husband to discuss with pt family and outlined the benefits of SNF at  discharge. Pt husband is agreeable to SNF search in [facility] as an option for discharge plan. CSW completed FL2, provided dementia note on shadow chart for MD signature in order for pasarr to be submitted, and initiated SNF search to [facility]. CSW to follow up with pt husband in regard to bed offers and continue to discuss discharge planning process.   Assessment/plan status:  Psychosocial Support/Ongoing Assessment of Needs Other assessment/ plan:   discharge planning   Information/referral to community resources:   [facility] list placed in pt room    PATIENT'S/FAMILY'S RESPONSE TO PLAN OF CARE: Per chart, pt with advancing dementia and disoriented at this time. Pt spouse is actively involved and currently considering all options for discharge plan.

## 2012-02-04 NOTE — Progress Notes (Addendum)
Clinical Social Work Department CLINICAL SOCIAL WORK PLACEMENT NOTE 02/04/2012  Patient:  Jill Mckinney, Jill Mckinney  Account Number:  [id number] Admit date:  02/01/2012  Clinical Social Worker:  Jacelyn Grip  Date/time:  02/04/2012 02:30 PM  Clinical Social Work is seeking post-discharge placement for this patient at the following level of care:   SKILLED NURSING   (*CSW will update this form in Epic as items are completed)   02/04/2012  Patient/family provided with [facility] Department of Clinical Social Work's list of facilities offering this level of care within the geographic area requested by the patient (or if unable, by the patient's family).  02/04/2012  Patient/family informed of their freedom to choose among providers that offer the needed level of care, that participate in Medicare, Medicaid or managed care program needed by the patient, have an available bed and are willing to accept the patient.  02/04/2012  Patient/family informed of [facility]' ownership interest in [facility], as well as of the fact that they are under no obligation to receive care at this facility.  PASARR submitted to EDS on  02/05/12 PASARR number received from EDS on  02/06/2012  FL2 transmitted to all facilities in geographic area requested by pt/family on  02/04/2012 FL2 transmitted to all facilities within larger geographic area on   Patient informed that his/her managed care company has contracts with or will negotiate with  certain facilities, including the following:     Patient/family informed of bed offers received:  02/06/2012 Patient chooses bed at [facility] Physician recommends and patient chooses bed at    Patient to be transferred to  on  02/06/2012 Patient to be transferred to facility by EMS  The following physician request were entered in Epic:   Additional Comments:  PATIENT DAUGHTER CONTACTED AND AGREEABLE TO SNF AT [facility]   Jacklynn Lewis, MSW, [facility]    Clinical Social Work [phone]  Ashley Jacobs, MSW LCSW [phone]

## 2012-02-04 NOTE — Evaluation (Signed)
Occupational Therapy Evaluation and Discharge Patient Details Name: Jill Mckinney MRN: [id number] DOB: Oct 11, 1923 Today's Date: 02/04/2012 Time: 6644-0347 OT Time Calculation (min): 39 min  OT Assessment / Plan / Recommendation Clinical Impression  This 78 yo female admitted with near sycope, multiple falls, advancing dementia, failure to thrive, UTI, encephalopathy presents to acute OT with problems below. WIll benefit from continue OT at SNF, acute OT will sign off.    OT Assessment  All further OT needs can be met in the next venue of care    Follow Up Recommendations  Skilled nursing facility    Barriers to Discharge Decreased caregiver support    Equipment Recommendations  Defer to next venue    Recommendations for Other Services    Frequency       Precautions / Restrictions Precautions Precautions: Fall Precaution Comments: blind   Pertinent Vitals/Pain Hip and back pain per patient, but could not tell us which hip    ADL  Eating/Feeding: Simulated;+1 Total assistance (Pt legally blind) Where Assessed - Eating/Feeding: Bed level Grooming: Simulated;Minimal assistance Where Assessed - Grooming: Unsupported sitting Upper Body Bathing: Simulated;Minimal assistance Where Assessed - Upper Body Bathing: Unsupported sitting Lower Body Bathing: Simulated;+1 Total assistance Where Assessed - Lower Body Bathing: Supported sit to stand (with +2 sit to stand) Upper Body Dressing: Moderate assistance Where Assessed - Upper Body Dressing: Unsupported sitting Lower Body Dressing: Simulated;+1 Total assistance Where Assessed - Lower Body Dressing: Supported sit to stand (with +2 sit to stand) Toilet Transfer: Simulated;+2 Total assistance Toilet Transfer: Patient Percentage: 30% Toilet Transfer Method: Sit to [place]:  (Bed to recliner to her left, stand pivot) Toileting - Clothing Manipulation and Hygiene: Performed;+1 Total assistance Where Assessed  - Toileting Clothing Manipulation and Hygiene: Supine, head of bed flat;Rolling right and/or left Equipment Used: Gait belt Transfers/Ambulation Related to ADLs: Total A+2 (pt=30%)    OT Diagnosis: Generalized weakness;Cognitive deficits;Acute pain  OT Problem List: Decreased strength;Impaired balance (sitting and/or standing);Decreased cognition;Decreased knowledge of use of DME or AE;Pain (anxiety over falling) OT Treatment Interventions:     OT Goals    Visit Information  Last OT Received On: 02/04/12 Assistance Needed: +2 PT/OT Co-Evaluation/Treatment: Yes    Subjective Data  Subjective: I am going to make a mess I cannot hold it (in reference to a bowel movment) Patient Stated Goal: Unable   Prior Functioning  Vision/Perception         Cognition  Overall Cognitive Status: History of cognitive impairments - at baseline Arousal/Alertness: Other (comment) (pt able to be aroused but maintains eyes closed) Orientation Level:  (unable to state)    Extremity/Trunk Assessment Right Upper Extremity Assessment RUE ROM/Strength/Tone:  (grossly 3/5) Left Upper Extremity Assessment LUE ROM/Strength/Tone:  (grossly 3/5)   Mobility Bed Mobility Bed Mobility: Rolling Right;Rolling Left;Sitting - Scoot to Edge of Bed;Supine to Sit Rolling Right: 1: +1 Total assist Rolling Left: 1: +1 Total assist Supine to Sit: 1: +1 Total assist;HOB flat Sitting - Scoot to Edge of Bed: 2: Max assist Details for Bed Mobility Assistance: Pt with total assist of pad and extremity positioning to roll with resistance toward left side. Supine to sit pivoted pt's lower body and assited with trunk elevation. Use of pad to pivot to EOB Transfers Sit to Stand: 1: +2 Total assist;From bed Sit to Stand: Patient Percentage: 30% Stand to Sit: 1: +2 Total assist;To chair/3-in-1 Stand to Sit: Patient Percentage: 30% Details for Transfer Assistance: tactile cueing at axilla along  with verbal cues to achieve  anterior translation for standing with guiding and assist to pivot to right and sit in chair with pt grasping onto therapists, able to advance feet with cueing and assist for weight shifting   Exercise    Balance Static Sitting Balance Static Sitting - Balance Support: Feet supported Static Sitting - Level of Assistance: 3: Mod assist;5: Stand by assistance Static Sitting - Comment/# of Minutes: pt initially mod assist to sit EOB due to posterior lean after able to achieve midline balance and maintain 5 min with minguard  End of Session OT - End of Session Equipment Utilized During Treatment: Gait belt Activity Tolerance: Patient limited by pain (also limited by fear of falling) Patient left: in chair;with call bell/phone within reach;with chair alarm set Nurse Communication: Mobility status (+2 A)       Evette Georges [phone] 02/04/2012, 11:39 AM

## 2012-02-05 LAB — CBC WITH DIFFERENTIAL/PLATELET
Basophils Absolute: 0 10*3/uL (ref 0.0–0.1)
Basophils Relative: 0 % (ref 0–1)
MCHC: 34.1 g/dL (ref 30.0–36.0)
Neutro Abs: 3.9 10*3/uL (ref 1.7–7.7)
Neutrophils Relative %: 59 % (ref 43–77)
Platelets: 270 10*3/uL (ref 150–400)
RDW: 14 % (ref 11.5–15.5)

## 2012-02-05 LAB — BASIC METABOLIC PANEL
BUN: 9 mg/dL (ref 6–23)
Creatinine, Ser: 0.51 mg/dL (ref 0.50–1.10)
GFR calc Af Amer: >90 mL/min (ref >90)
GFR calc non Af Amer: 85 mL/min — ABNORMAL LOW (ref >90)

## 2012-02-05 NOTE — Progress Notes (Signed)
Subjective: CSW notes reviewed. I have not had a chance to talk with Mr. Daise - will encourage ST-SNF at a minimum due to her needs and due to her medical management of agitation.   Chart reviewed- she did not require any supplemental haldol during the night.   Objective: Lab: Lab Results  Component Value Date   WBC 6.7 02/05/2012   HGB 11.0* 02/05/2012   HCT 32.3* 02/05/2012   MCV 95.6 02/05/2012   PLT 270 02/05/2012   BMET    Component Value Date/Time   NA 142 02/02/2012 0546   K 3.1* 02/02/2012 0546   CL 106 02/02/2012 0546   CO2 24 02/02/2012 0546   GLUCOSE 123* 02/02/2012 0546   BUN 10 02/02/2012 0546   CREATININE 0.55 02/02/2012 0546   CALCIUM 8.8 02/02/2012 0546   GFRNONAA 83* 02/02/2012 0546   GFRAA >90 02/02/2012 0546     Imaging:no new imaging  Scheduled Meds:   . aspirin EC  81 mg Oral Daily  . cefTRIAXone (ROCEPHIN) IVPB 1 gram/50 mL D5W  1 g Intravenous Q24H  . divalproex  125 mg Oral BID  . docusate sodium  100 mg Oral BID  . donepezil  10 mg Oral QODAY  . enoxaparin (LOVENOX) injection  40 mg Subcutaneous Q24H  . folic acid  500 mcg Oral Daily  . OLANZapine  2.5 mg Oral BID  . phenazopyridine  100 mg Oral TID   Continuous Infusions:   . sodium chloride 50 mL/hr at 02/04/12 1822   PRN Meds:.acetaminophen, acetaminophen, bisacodyl, LORazepam, morphine injection, ondansetron (ZOFRAN) IV, ondansetron, sodium phosphate   Physical Exam: Filed Vitals:   02/05/12 0545  BP: 124/79  Pulse:   Temp: 99.1 F (37.3 C)  Resp: 20    Intake/Output Summary (Last 24 hours) at 02/05/12 1610 Last data filed at 02/05/12 0600  Gross per 24 hour  Intake    830 ml  Output      0 ml  Net    830 ml   Gen'l - elderly frail appearing white woman in no distress, relatively calm HEENT - dry mucus membranes,  Cor - IRIR with rapid rate, II/VI systolic murmur PUlm - clear to A&P, no wheezing Abd- BS+, soft, no guarding or rebound.      Assessment/Plan: 1. Dehydration  - I&O positive. Plan - IV to saline lock  2. ID #4/5 Rocephin for UTI. CBC pending  3. HTN - controlled  4. Dementia - seems to be calmer. Tolerating zyprexa Plan- no change in regimen  5. Hip pain - PT notes reviewed - still needs 2+ assist with movment Plan - ST-SNF  6. Low K - Bmet pending  7. GI - per RN report patient had BM-small  8. For ST- SNF. Husband wants to take her home when she is manageable.    Casimiro Needle Norins [place] [facility] Call-grp - [facility] ([phone]; ([phone]  02/05/2012, 6:45 AM

## 2012-02-05 NOTE — Progress Notes (Signed)
Clinical Social Work  Landscape architect information including H&P, meds, FL2 and dementia note. CSW will continue to follow up in order to receive pasarr number.  [place], [place] [phone] (Coverage for Jacklynn Lewis)

## 2012-02-06 MED ORDER — ACETAMINOPHEN 325 MG PO TABS: 650.0000 mg | ORAL_TABLET | Freq: Four times a day (QID) | ORAL | Status: DC | PRN

## 2012-02-06 MED ORDER — OLANZAPINE 2.5 MG PO TABS: 2.5000 mg | ORAL_TABLET | Freq: Two times a day (BID) | ORAL | Status: DC

## 2012-02-06 MED ORDER — BISACODYL 10 MG RE SUPP: 10.0000 mg | Freq: Every day | RECTAL | Status: AC | PRN

## 2012-02-06 MED ORDER — POTASSIUM CHLORIDE CRYS ER 20 MEQ PO TBCR
40.0000 meq | EXTENDED_RELEASE_TABLET | Freq: Three times a day (TID) | ORAL | Status: DC
  Administered 2012-02-06 (×2): 40 meq via ORAL
  Filled 2012-02-06 (×2): qty 2

## 2012-02-06 NOTE — Progress Notes (Signed)
[facility]: Coverage for [phone]  Patient is medically stable for DC to SNF and patient daughter has chosen bed at [facility]. Daughter to complete paperwork and patient to be transported by EMS to SNF arranged by EMS.  [facility] notified of bed choice, agreeable to dc today.  Called MD, had MD sign FL2 and give orders for dc.  No other needs at this time,  DC to SNF: [facility].  Ashley Jacobs, MSW LCSW [phone]

## 2012-02-06 NOTE — Discharge Summary (Signed)
NAMEAMORIE, RENTZ NO.:  [id number]  MEDICAL RECORD NO.:  [id number]  LOCATION:  5523                         FACILITY:  [facility]  PHYSICIAN:  [name], MD  DATE OF BIRTH:  1924/05/26  DATE OF ADMISSION:  02/01/2012 DATE OF DISCHARGE:                              DISCHARGE SUMMARY   Medically stable for transfer to skilled care when bed available.  ADMITTING DIAGNOSES: 1. Dehydration. 2. Hip pain and mobility limitation. 3. Urinary tract infection. 4. Hypertension. 5. Dementia with constant agitation, verbalization, repetitive     phrasing. 6. Hypokalemia. 7. Constipation.  DISCHARGE DIAGNOSES: 1. Dehydration. 2. Hip pain and mobility limitation. 3. Urinary tract infection. 4. Hypertension. 5. Dementia with constant agitation, verbalization, repetitive     phrasing. 6. Hypokalemia. 7. Constipation.  CONSULTANTS:  PT and OT.  No medical consultation needed.  PROCEDURES:  Bilateral hip films on the day of admission with osteopenia without any bony abnormalities or fractures.  The patient did have a recent chest x-ray, January 17, 2012, which showed cardiomegaly with no acute findings.  HISTORY OF PRESENT ILLNESS:  Ms. Merlin is an 78 year old woman with advanced dementia, other medical problems including OCD with mild schizophrenia, history of celiac disease, hypertension, frequent UTIs, known coronary artery disease, hearing loss, osteoporosis, insomnia, tachyarrhythmias, B12 deficiency. The patient has been living at home.  Her husband, with multiple medical problems, and her daughter have been her primary caretakers.  Her care has been increasingly difficult to manage because of her incessant obsession with toileting as well as continued agitation.  On the day of admission, (76 the patient's husband reports that the patient getting to bedside commode where she lost her footing and fell landing on her buttocks.  She subsequently complained  of significant pain with movement and difficulty with weightbearing.  In addition, family reports the patient's oral intake has been very poor, that she has had decreasing levels of activity and refusing to get out of bed.  In the emergency department, the patient had no fracture, but she was thought to have significant dehydration and a persistent UTI and subsequently admitted for care and consideration of possible placement.  Please see the H and P for past medical history, family history, social history, and admission exam.  Other records were also available in Epic.  HOSPITAL COURSE: 1. Dehydration,  the patient was gently hydrated with IV fluids.  Her     BUN and creatinine did remain stable with no evidence of prerenal     azotemia.  Her p.o. intake did remain poor, but she was able to be     switched to oral intake only with the IV line to saline lock. 2. ID.  The patient with a urinary tract infection with UA on the day     of admission revealing orange urine from azopyridine.  Specific     gravity is 1.024.  She had positive leukocyte, positive nitrite, 3-     6 wbc's per high-powered field, few bacteria with hyaline casts.     Because the patient had failed to improve with outpatient therapy,     she was switched to IV Rocephin and has completed 5  days of     treatment.  She has had no fevers and she has presumably cleared     her UTI. 3. Hypertension.  The patient's blood pressure during her hospital     stay has remained stable.  She has been continued on her home     medications, which did include amlodipine 5 mg daily.  This will be     continued at time of transfer. 4. Hip pain.  The patient had negative x-rays.  PT and OT evaluations     were performed and the patient did require 2+ assistance for all of     her mobility.  Cause of pain is thought to be osteoarthritic in     nature along with contusion from recent fall.  The patient would     benefit from skilled care  with probable increase in mobility with     the goal of being able to return home. 5. Hypokalemia.  The patient was hypokalemic at admission.  When her     most recent potassium on the 20th was 3.0, she will need to     continue with oral replacement.  Her potassium should not delay her     transfer to skilled care if bed available. 6. Dementia.  The patient has a long history of progressive dementia     as well as mild psychotic/schizophrenic and OCD behavior.  She has     been on Namenda, Aricept, and Ativan.  This has not really     controlled her progressive dementia or managed her symptoms well.     Remeron had been added to her regimen at 50 mg daily.  She did have     repetitive verbal behavior and repetitive demands for toileting.     During this admission, she was able to be weaned off of Namenda and     Aricept as being futile in her affect.  She was started on Zyprexa     2.5 mg b.i.d., which markedly improved her agitation and behavior.     Plan of discharge will be to continue Zyprexa, to continue with her     Depakote and leave her other medications; Namenda Aricept, and     Remeron off.  She will need to be followed closely in regard to     potential for extrapyramidal symptoms.  We will obtain a 12-lead     EKG prior to discharge to evaluate QT interval.  Her behavior has     been improved with her medication changes. 7. GI.  The patient had some problems with initial constipation, but     she was able to have bowel movement with the use of stool     softeners.  With the patient's medical problems having stabilized     with her completing an IV course of antibiotics with her being     adequately hydrated, she is stable for transfer to a skilled care     facility.  She will require continued monitoring for her low     potassium and she will need to be monitored in regard to potential     side effects from atypical antipsychotic.  DISCHARGE EXAMINATION:  VITAL SIGNS:   Temperature was 97.9, blood pressure 134/79, pulse 98, respirations 20, O2 sats 93% on oxygen. GENERAL APPEARANCE:  This is a very elderly frail-appearing woman, lying in bed, in no acute distress. HEENT:  Mild temporal wasting is noted.  Eyes were not examined because  the patient keeps her eyes clenched. Oropharynx with dentures in place. Dry mucous membranes are noted. NECK:  No thyromegaly.  No masses. NODES:  No adenopathy is noted in the supraclavicular, cervical regions. CARDIOVASCULAR:  2+ radial pulses.  No carotid bruits.  She had a quiet precordium.  Her heart rate was regular.  She had a 2/6 systolic murmur heard best at the right sternal border. BREASTS:  Deferred. ABDOMEN:  Positive bowel sounds are noted.  Abdomen is soft.  There is no guarding or rebound.  No tenderness to exam. GENITALIA:  Deferred. RECTAL:  Deferred. EXTREMITIES:  Mild interosseous wasting in her hands.  No major deformities are noted. DERM:  The patient has no heel or sacral decubiti.  She has some mild bruising. NEURO:  The patient is somnolent.  She has not been oriented. She does move all extremities spontaneously.  She is able to interact with staff and has been able to participate with physical therapy, occupational therapy.  FINAL LABORATORY:  From August 20 with a white count of 6700, hemoglobin of 11 g, platelet count 270,000.  Chemistries with sodium of 141, potassium 3.0, chloride 107, CO2 24, BUN of 9, creatinine 0.51, glucose 102, calcium 8.2.  The patient did have a valproic acid level that was 34.6, which is subtherapeutic.  TSH was checked and was normal range at 4.25.  Urine culture from the day of admission with no growth to date.  DISPOSITION:  The patient is to be discharged to a skilled care facility when bed available for short-term rehab with the goal of being able to return home with the care of her family.  MEDICATIONS AT DISCHARGE: 1. Tylenol 650 q.6 as needed. 2.  Dulcolax suppository daily on an as-needed basis. 3. Ativan 0.5 mg q.6 h. p.r.n. severe anxiety or insomnia. 4. Aspirin 81 mg daily. 5. Depakote Sprinkle 125 mg b.i.d. 6. Colace 100 mg twice daily. 7. Folic acid 500 mcg daily. 8. Zyprexa 2.5 mg b.i.d. 9. Pyridium 100 mg t.i.d.  DIET:  The patient will be on a gluten free diet.  CODE STATUS:  The patient is a DNR.  CONDITION AT TIME OF DISCHARGE DICTATION:  Medically stable, able to transfer to skilled facility.  PROGNOSIS:  Her prognosis is very poor given her advanced age and multiple medical problems.     Rosalyn Gess Norins, MD     MEN/MEDQ  D:  02/06/2012  T:  02/06/2012  Job:  [id number]

## 2012-02-06 NOTE — Plan of Care (Signed)
Problem: Discharge Progression Outcomes Goal: Complications resolved/controlled Outcome: Not Progressing Severe dementia, incontinence unable to be resolved.

## 2012-02-06 NOTE — Progress Notes (Signed)
Jill Mckinney to be D/C'd Skilled nursing facility per MD order.    Jill Mckinney, Jill Mckinney  Home Medication Instructions ZOX:[id number]   Printed on:02/06/12 1358  Medication Information                    aspirin EC 81 MG tablet Take 81 mg by mouth daily.             Calcium Citrate-Vitamin D (CALCIUM CITRATE + PO) Take 2 tablets by mouth 2 (two) times daily.             folic acid (FOLVITE) 400 MCG tablet Take 400 mcg by mouth daily.             B Complex-C (SUPER B COMPLEX PO) Take 1 tablet by mouth daily.             amLODipine (NORVASC) 5 MG tablet Take 1 tablet (5 mg total) by mouth daily.           docusate sodium (COLACE) 100 MG capsule Take 100 mg by mouth 2 (two) times daily.             divalproex (DEPAKOTE SPRINKLE) 125 MG capsule Take 125 mg by mouth 2 (two) times daily.           LORazepam (ATIVAN) 0.5 MG tablet Take 0.5-1 mg by mouth every 6 (six) hours as needed. anxiety           ferrous sulfate 325 (65 FE) MG tablet Take 325 mg by mouth daily with breakfast.           phenazopyridine (PYRIDIUM) 100 MG tablet Take 100 mg by mouth 3 (three) times daily. Urinary burning.           acetaminophen (TYLENOL) 325 MG tablet Take 2 tablets (650 mg total) by mouth every 6 (six) hours as needed (or Fever >/= 101).           bisacodyl (DULCOLAX) 10 MG suppository Place 1 suppository (10 mg total) rectally daily as needed.           OLANZapine (ZYPREXA) 2.5 MG tablet Take 1 tablet (2.5 mg total) by mouth 2 (two) times daily.             VVS, Skin clean, dry and intact without evidence of skin break down, no evidence of skin tears noted. Ecchymosis throughout.  IV catheter discontinued intact. Site without signs and symptoms of complications. Dressing and pressure applied.  An After Visit Summary was printed and placed in packet.  Patient escorted via stretcher, and D/C to [facility] via [place].  Kennyth Arnold D 02/06/2012 1:58 PM

## 2012-02-06 NOTE — Progress Notes (Signed)
Subjective: Resting quietly, awakens to exam.   Objective: Lab: Lab Results  Component Value Date   WBC 6.7 02/05/2012   HGB 11.0* 02/05/2012   HCT 32.3* 02/05/2012   MCV 95.6 02/05/2012   PLT 270 02/05/2012   BMET    Component Value Date/Time   NA 141 02/05/2012 0620   K 3.0* 02/05/2012 0620   CL 107 02/05/2012 0620   CO2 24 02/05/2012 0620   GLUCOSE 102* 02/05/2012 0620   BUN 9 02/05/2012 0620   CREATININE 0.51 02/05/2012 0620   CALCIUM 8.2* 02/05/2012 0620   GFRNONAA 85* 02/05/2012 0620   GFRAA >90 02/05/2012 0620     Imaging:  Scheduled Meds:   . aspirin EC  81 mg Oral Daily  . cefTRIAXone (ROCEPHIN) IVPB 1 gram/50 mL D5W  1 g Intravenous Q24H  . divalproex  125 mg Oral BID  . docusate sodium  100 mg Oral BID  . donepezil  10 mg Oral QODAY  . enoxaparin (LOVENOX) injection  40 mg Subcutaneous Q24H  . folic acid  500 mcg Oral Daily  . OLANZapine  2.5 mg Oral BID  . phenazopyridine  100 mg Oral TID   Continuous Infusions:   . sodium chloride 50 mL/hr at 02/05/12 1550   PRN Meds:.acetaminophen, acetaminophen, bisacodyl, LORazepam, morphine injection, ondansetron (ZOFRAN) IV, ondansetron, sodium phosphate   Physical Exam: Filed Vitals:   02/06/12 0600  BP: 134/79  Pulse: 98  Temp: 97.9 F (36.6 C)  Resp: 20  HEENT- [place]/at, keeps eyes closed - limited exam. Oropharynx with denture in place, dry mucus membranes Neck - supple Nodes - negative Cor- 2+ radial, quiet precordium, RRR II/VI systolic murmur Pulm - normal respirations Abd- BS+, soft, no guarding or rebound Pelvic/rectal deferred Ext- no deformity, Neuro - awake but sedated, not constantly verbalizing as she was at admission. CN II-XII grossly intact. MAE Derm- no heel or sacral decubiti.      Assessment/Plan: Problem 1-7 addressed and generally stable. Patient will be given oral K replacement to day. Bmet in [place] if not transferred to SNF. If transferred will need f/u Bmet.   Medically stable for  transfer to SNF when bed available. D/C dictated # [id number]   Illene Regulus [place] IM Call-grp - [facility] ([phone]; ([phone]  02/06/2012, 7:05 AM

## 2012-02-07 ENCOUNTER — Encounter (HOSPITAL_COMMUNITY): Payer: Self-pay | Admitting: Emergency Medicine

## 2012-02-07 ENCOUNTER — Inpatient Hospital Stay (HOSPITAL_COMMUNITY)
Admission: EM | Admit: 2012-02-07 | Discharge: 2012-02-12 | DRG: 309 | Disposition: A | Discharge status: Skilled Nursing Facility | Payer: Medicare Other | Attending: Internal Medicine | Admitting: Internal Medicine

## 2012-02-07 ENCOUNTER — Emergency Department (HOSPITAL_COMMUNITY): Payer: Medicare Other

## 2012-02-07 DIAGNOSIS — E8779 Other fluid overload: Secondary | ICD-10-CM | POA: Diagnosis present

## 2012-02-07 DIAGNOSIS — Z880 Allergy status to penicillin: Secondary | ICD-10-CM

## 2012-02-07 DIAGNOSIS — K5904 Chronic idiopathic constipation: Secondary | ICD-10-CM

## 2012-02-07 DIAGNOSIS — M810 Age-related osteoporosis without current pathological fracture: Secondary | ICD-10-CM | POA: Diagnosis present

## 2012-02-07 DIAGNOSIS — Z66 Do not resuscitate: Secondary | ICD-10-CM | POA: Diagnosis present

## 2012-02-07 DIAGNOSIS — Z882 Allergy status to sulfonamides status: Secondary | ICD-10-CM

## 2012-02-07 DIAGNOSIS — Z888 Allergy status to other drugs, medicaments and biological substances status: Secondary | ICD-10-CM

## 2012-02-07 DIAGNOSIS — R627 Adult failure to thrive: Secondary | ICD-10-CM

## 2012-02-07 DIAGNOSIS — E876 Hypokalemia: Secondary | ICD-10-CM

## 2012-02-07 DIAGNOSIS — I4891 Unspecified atrial fibrillation: Principal | ICD-10-CM

## 2012-02-07 DIAGNOSIS — I4892 Unspecified atrial flutter: Secondary | ICD-10-CM | POA: Diagnosis present

## 2012-02-07 DIAGNOSIS — M25559 Pain in unspecified hip: Secondary | ICD-10-CM

## 2012-02-07 DIAGNOSIS — I10 Essential (primary) hypertension: Secondary | ICD-10-CM | POA: Diagnosis present

## 2012-02-07 DIAGNOSIS — H9190 Unspecified hearing loss, unspecified ear: Secondary | ICD-10-CM

## 2012-02-07 DIAGNOSIS — N390 Urinary tract infection, site not specified: Secondary | ICD-10-CM | POA: Diagnosis present

## 2012-02-07 DIAGNOSIS — Z8659 Personal history of other mental and behavioral disorders: Secondary | ICD-10-CM

## 2012-02-07 DIAGNOSIS — J90 Pleural effusion, not elsewhere classified: Secondary | ICD-10-CM

## 2012-02-07 DIAGNOSIS — F0390 Unspecified dementia without behavioral disturbance: Secondary | ICD-10-CM | POA: Diagnosis present

## 2012-02-07 DIAGNOSIS — G4700 Insomnia, unspecified: Secondary | ICD-10-CM | POA: Diagnosis present

## 2012-02-07 DIAGNOSIS — K900 Celiac disease: Secondary | ICD-10-CM

## 2012-02-07 DIAGNOSIS — I83009 Varicose veins of unspecified lower extremity with ulcer of unspecified site: Secondary | ICD-10-CM

## 2012-02-07 LAB — URINALYSIS, ROUTINE W REFLEX MICROSCOPIC
Ketones, ur: 40 mg/dL — AB
Nitrite: POSITIVE — AB
Protein, ur: NEGATIVE mg/dL
pH: 5 (ref 5.0–8.0)

## 2012-02-07 LAB — CBC WITH DIFFERENTIAL/PLATELET
Basophils Relative: 0 % (ref 0–1)
Hemoglobin: 12 g/dL (ref 12.0–15.0)
Lymphs Abs: 1.8 10*3/uL (ref 0.7–4.0)
MCHC: 33.7 g/dL (ref 30.0–36.0)
Monocytes Relative: 14 % — ABNORMAL HIGH (ref 3–12)
Neutro Abs: 5.6 10*3/uL (ref 1.7–7.7)
Neutrophils Relative %: 63 % (ref 43–77)
RBC: 3.74 MIL/uL — ABNORMAL LOW (ref 3.87–5.11)
WBC: 8.8 10*3/uL (ref 4.0–10.5)

## 2012-02-07 LAB — COMPREHENSIVE METABOLIC PANEL
Albumin: 3 g/dL — ABNORMAL LOW (ref 3.5–5.2)
Alkaline Phosphatase: 73 U/L (ref 39–117)
BUN: 12 mg/dL (ref 6–23)
Chloride: 105 mEq/L (ref 96–112)
Potassium: 4.1 mEq/L (ref 3.5–5.1)
Total Bilirubin: 0.3 mg/dL (ref 0.3–1.2)

## 2012-02-07 LAB — PROTIME-INR: Prothrombin Time: 15.1 seconds (ref 11.6–15.2)

## 2012-02-07 LAB — URINE MICROSCOPIC-ADD ON

## 2012-02-07 LAB — TROPONIN I: Troponin I: <0.3 ng/mL (ref <0.30)

## 2012-02-07 MED ORDER — ENOXAPARIN SODIUM 60 MG/0.6ML ~~LOC~~ SOLN
60.0000 mg | Freq: Two times a day (BID) | SUBCUTANEOUS | Status: DC
  Administered 2012-02-07: 60 mg via SUBCUTANEOUS
  Filled 2012-02-07 (×2): qty 0.6

## 2012-02-07 MED ORDER — DILTIAZEM HCL 50 MG/10ML IV SOLN
20.0000 mg | Freq: Once | INTRAVENOUS | Status: AC
  Administered 2012-02-07: 20:00:00 via INTRAVENOUS
  Filled 2012-02-07: qty 4

## 2012-02-07 MED ORDER — DILTIAZEM HCL 100 MG IV SOLR
5.0000 mg/h | INTRAVENOUS | Status: DC
  Administered 2012-02-08: 01:00:00 10 mg/h via INTRAVENOUS
  Filled 2012-02-07: qty 100

## 2012-02-07 MED ORDER — SODIUM CHLORIDE 0.9 % IV BOLUS (SEPSIS)
1000.0000 mL | Freq: Once | INTRAVENOUS | Status: AC
  Administered 2012-02-07: 1000 mL via INTRAVENOUS

## 2012-02-07 MED ORDER — FUROSEMIDE 10 MG/ML IJ SOLN
40.0000 mg | Freq: Once | INTRAMUSCULAR | Status: AC
  Administered 2012-02-07: 40 mg via INTRAVENOUS
  Filled 2012-02-07: qty 4

## 2012-02-07 NOTE — ED Provider Notes (Signed)
History     CSN: [id number]  Arrival date & time 02/07/12  1827   First MD Initiated Contact with Patient 02/07/12 1829      Chief Complaint  Patient presents with  . Atrial Fibrillation    (Consider location/radiation/quality/duration/timing/severity/associated sxs/prior treatment) HPI Comments: HAJA CREGO 78 y.o. female   The chief complaint is: Patient presents with:   Atrial Fibrillation    Patient presents today via EMS from her skilled nursing facility at [facility]. EMS reports that the nursing staff at her home town she had decreased blood pressure to 76/56, heart rate greater than 120 and states that her level of consciousness was below baseline. He has a history of end-stage dementia. EMS reports EKG is A. fib with rapid ventricular. Patient is unable to provide history due to her state of consciousness.   Patient is a 78 y.o. female presenting with atrial fibrillation. The history is provided by the nursing home and the EMS personnel. The history is limited by the condition of the patient.  Atrial Fibrillation    Past Medical History  Diagnosis Date  . CATARACT EXTRACTION, HX OF 06/02/2007  . Celiac disease 06/02/2007  . CHEST WALL PAIN, ANTERIOR 11/24/2009  . Constipation 04/22/2011  . DEMENTIA, HX OF 06/02/2007  . FREQUENCY, URINARY 11/26/2008  . HEARING LOSS 06/02/2007  . Hypertension 04/24/2011  . INSOMNIA, CHRONIC 08/21/2010  . OSTEOPOROSIS 06/02/2007  . Other acquired absence of organ 06/02/2007  . ROTATOR CUFF REPAIR, HX OF 06/02/2007  . STASIS ULCER 06/02/2007  . TACHYARRHYTHMIA 06/02/2007  . Unspecified psychosis 06/02/2007  . UTI (lower urinary tract infection) 04/22/2011  . VITAMIN B12 DEFICIENCY 06/02/2007    Past Surgical History  Procedure Date  . Cardiac electrophysiology mapping and ablation   . Hand surgery   . Rotator cuff repair   . Excision large mole removed from back & breast   . Cholecystectomy   . Tonsillectomy   . Orif  ankle dislocation   . Cataract extraction     Family History  Problem Relation Age of Onset  . Stroke Mother   . Lung cancer Father   . Breast cancer Maternal Aunt     History  Substance Use Topics  . Smoking status: Never Smoker   . Smokeless tobacco: Never Used  . Alcohol Use: No    OB History    Grav Para Term Preterm Abortions TAB SAB Ect Mult Living                  Review of Systems  Unable to perform ROS   Allergies  Codeine; Penicillins; and Sulfa antibiotics  Home Medications   Current Outpatient Rx  Name Route Sig Dispense Refill  . ACETAMINOPHEN 325 MG PO TABS Oral Take 2 tablets (650 mg total) by mouth every 6 (six) hours as needed (or Fever >/= 101). 60 tablet 11  . AMLODIPINE BESYLATE 5 MG PO TABS Oral Take 1 tablet (5 mg total) by mouth daily. 30 tablet 11  . ASPIRIN EC 81 MG PO TBEC Oral Take 81 mg by mouth daily.      . SUPER B COMPLEX PO Oral Take 1 tablet by mouth daily.      Marland Kitchen BISACODYL 10 MG RE SUPP Rectal Place 1 suppository (10 mg total) rectally daily as needed. 12 suppository 11  . CALCIUM CITRATE + PO Oral Take 2 tablets by mouth 2 (two) times daily.      Marland Kitchen DIVALPROEX SODIUM 125 MG PO  CPSP Oral Take 125 mg by mouth 2 (two) times daily.    Marland Kitchen DOCUSATE SODIUM 100 MG PO CAPS Oral Take 100 mg by mouth 2 (two) times daily.      Marland Kitchen FERROUS SULFATE 325 (65 FE) MG PO TABS Oral Take 325 mg by mouth daily with breakfast.    . FOLIC ACID 400 MCG PO TABS Oral Take 400 mcg by mouth daily.      Marland Kitchen LORAZEPAM 0.5 MG PO TABS Oral Take 0.5-1 mg by mouth every 6 (six) hours as needed. anxiety    . OLANZAPINE 2.5 MG PO TABS Oral Take 1 tablet (2.5 mg total) by mouth 2 (two) times daily. 60 tablet 11  . PHENAZOPYRIDINE HCL 100 MG PO TABS Oral Take 100 mg by mouth 3 (three) times daily. Urinary burning.      BP 124/60  Pulse 153  Resp 25  SpO2 95%  Physical Exam  Nursing note and vitals reviewed. Constitutional: She appears distressed.       Chronically ill  appearing Patient is sitting on her examining table breathing quickly. She did not make eye contact. She is not respond to questioning or direction.  HENT:  Head: Normocephalic and atraumatic.  Eyes: Conjunctivae are normal. Pupils are equal, round, and reactive to light.  Neck: No JVD present. No tracheal deviation present.  Cardiovascular:       Tachycardic. Pulse irregularly irregular. No murmurs gallops or rubs. No palpable pulses in the dorsalis pedis or posterior tibialis.  Pulmonary/Chest: She has no wheezes. She has no rales.       Tachypneic to 26. Quick shallow breathing. Lungs clear to auscultation bilaterally.  Abdominal: Soft. She exhibits no distension.  Neurological: She is unresponsive.  Skin: She is not diaphoretic.    ED Course  Procedures (including critical care time)  Results for orders placed during the hospital encounter of 02/07/12  CBC WITH DIFFERENTIAL      Component Value Range   WBC 8.8  4.0 - 10.5 K/uL   RBC 3.74 (*) 3.87 - 5.11 MIL/uL   Hemoglobin 12.0  12.0 - 15.0 g/dL   HCT 16.1 (*) 09.6 - 04.5 %   MCV 95.2  78.0 - 100.0 fL   MCH 32.1  26.0 - 34.0 pg   MCHC 33.7  30.0 - 36.0 g/dL   RDW 40.9  81.1 - 91.4 %   Platelets 415 (*) 150 - 400 K/uL   Neutrophils Relative 63  43 - 77 %   Neutro Abs 5.6  1.7 - 7.7 K/uL   Lymphocytes Relative 20  12 - 46 %   Lymphs Abs 1.8  0.7 - 4.0 K/uL   Monocytes Relative 14 (*) 3 - 12 %   Monocytes Absolute 1.2 (*) 0.1 - 1.0 K/uL   Eosinophils Relative 3  0 - 5 %   Eosinophils Absolute 0.3  0.0 - 0.7 K/uL   Basophils Relative 0  0 - 1 %   Basophils Absolute 0.0  0.0 - 0.1 K/uL   BP 124/60  Pulse 153  Resp 25  SpO2 95% Patient appears to have cardiac history of tachycardia arrhythmia. Per the nurse who was caring for her she has a history of ablation, I was unable to find this in her chart. EKG shows A. fib with RVR. Patient is started on 500 mL saline and 20 of Cardizem. Looking for underlying etiology of new onset  A. Fib. Including infection, MI, thyroid dysfunction.  8:46 PM BP 112/68  Pulse 89  Resp 24  SpO2 95% Patient's nurses notified me that the patient has an angry at rash in her diaper region. There is noticeably red and inflamed skin, no signs of ulcerations. She does have staining from the prior radium that she is taking, she also has Pyridium staining on her fingers and around her mouth. We have removed her diaper and place her on a chuck. She is able to communicate when she went to bed pan at this point. Her daughter is in a ram and has given me a better history. She states that her mother was discharged from here yesterday with a diagnosis of urinary tract infection she was admitted after failing outpatient therapy with Cipro. She was discharged to the skilled nursing facility from here.  I am currently awaiting the patient's PT INR. Plan to start and Lovenox. Heart rate has decreased significantly. Still in atrial fibrillation. Plan is to admit the patient.  I spoke with internal medicine who has agreed to admit the patient for her  new-onset atrial fibrillation. Subtle suspects that her A. fib may be a result of her bilateral pleural effusions, and has requested that we administer IV Lasix. She will be admitted  Dg Chest [facility]  02/07/2012  *RADIOLOGY REPORT*  Clinical Data: Arrhythmia.  PORTABLE CHEST - 1 VIEW  Comparison: 01/17/2012  Findings: Mild cardiac enlargement.  Negative for heart failure. Mild atelectasis in the lung bases has progressed in the interval. Small pleural effusions have developed.  IMPRESSION: Small pleural effusions and mild bibasilar atelectasis have developed since the prior study.  Negative for edema.   Original Report Authenticated By: Camelia Phenes, M.D.      1. Atrial fibrillation   2. Pleural effusion, bilateral       MDM  Patient admitted to telemetry floor.        Arthor Captain, PA-C 02/08/12 0134

## 2012-02-07 NOTE — H&P (Signed)
PCP:   Illene Regulus, MD   Chief Complaint: agitation  HPI: Jill Mckinney is an 87/F with history of advanced dementia, frequent urinary infections, hypertension, was discharged yesterday to skilled nursing facility after treatment for dehydration and urinary tract infection. She was sent back from her facility today to due to increased agitation, upon evaluation the emergency room she was noted to have atrial flutter/atrial fibrillation with rapid ventricular response, heart rate ranging from 140-160 initially.  Patient is an unable to provide any history due to advanced dementia. I called her daughter, who reports prior history of A. fib more than 20 years ago status post ablation at [facility].  Allergies:   Allergies  Allergen Reactions  . Codeine Nausea And Vomiting  . Penicillins Rash  . Sulfa Antibiotics Rash      Past Medical History  Diagnosis Date  . CATARACT EXTRACTION, HX OF 06/02/2007  . Celiac disease 06/02/2007  . CHEST WALL PAIN, ANTERIOR 11/24/2009  . Constipation 04/22/2011  . DEMENTIA, HX OF 06/02/2007  . FREQUENCY, URINARY 11/26/2008  . HEARING LOSS 06/02/2007  . Hypertension 04/24/2011  . INSOMNIA, CHRONIC 08/21/2010  . OSTEOPOROSIS 06/02/2007  . Other acquired absence of organ 06/02/2007  . ROTATOR CUFF REPAIR, HX OF 06/02/2007  . STASIS ULCER 06/02/2007  . TACHYARRHYTHMIA 06/02/2007  . Unspecified psychosis 06/02/2007  . UTI (lower urinary tract infection) 04/22/2011  . VITAMIN B12 DEFICIENCY 06/02/2007    Past Surgical History  Procedure Date  . Cardiac electrophysiology mapping and ablation   . Hand surgery   . Rotator cuff repair   . Excision large mole removed from back & breast   . Cholecystectomy   . Tonsillectomy   . Orif ankle dislocation   . Cataract extraction     Prior to Admission medications   Medication Sig Start Date End Date Taking? Authorizing Provider  acetaminophen (TYLENOL) 325 MG tablet Take 2 tablets (650  mg total) by mouth every 6 (six) hours as needed (or Fever >/= 101). 02/06/12 02/05/13 Yes Jacques Navy, MD  amLODipine (NORVASC) 5 MG tablet Take 1 tablet (5 mg total) by mouth daily. 04/25/11 04/24/12 Yes Jacques Navy, MD  aspirin EC 81 MG tablet Take 81 mg by mouth daily.     Yes Historical Provider, MD  B Complex-C (SUPER B COMPLEX PO) Take 1 tablet by mouth daily.     Yes Historical Provider, MD  bisacodyl (DULCOLAX) 10 MG suppository Place 1 suppository (10 mg total) rectally daily as needed. 02/06/12 02/16/12 Yes Jacques Navy, MD  Calcium Citrate-Vitamin D (CALCIUM CITRATE + PO) Take 2 tablets by mouth 2 (two) times daily.     Yes Historical Provider, MD  divalproex (DEPAKOTE SPRINKLE) 125 MG capsule Take 125 mg by mouth 2 (two) times daily.   Yes Historical Provider, MD  docusate sodium (COLACE) 100 MG capsule Take 100 mg by mouth 2 (two) times daily.     Yes Historical Provider, MD  ferrous sulfate 325 (65 FE) MG tablet Take 325 mg by mouth daily with breakfast.   Yes Historical Provider, MD  folic acid (FOLVITE) 400 MCG tablet Take 400 mcg by mouth daily.     Yes Historical Provider, MD  LORazepam (ATIVAN) 0.5 MG tablet Take 0.5-1 mg by mouth every 6 (six) hours as needed. anxiety   Yes Historical Provider, MD  OLANZapine (ZYPREXA) 2.5 MG tablet Take 1 tablet (2.5 mg total) by mouth 2 (two) times daily. 02/06/12 03/07/12 Yes Rosalyn Gess  Norins, MD  phenazopyridine (PYRIDIUM) 100 MG tablet Take 100 mg by mouth 3 (three) times daily. Urinary burning.   Yes Historical Provider, MD    Social History:  reports that she has never smoked. She has never used smokeless tobacco. She reports that she does not drink alcohol. Her drug history not on file.  Family History  Problem Relation Age of Onset  . Stroke Mother   . Lung cancer Father   . Breast cancer Maternal Aunt     Review of Systems:  Constitutional: Denies fever, chills, diaphoresis, appetite change and fatigue.  HEENT: Denies  photophobia, eye pain, redness, hearing loss, ear pain, congestion, sore throat, rhinorrhea, sneezing, mouth sores, trouble swallowing, neck pain, neck stiffness and tinnitus.   Respiratory: Denies SOB, DOE, cough, chest tightness,  and wheezing.   Cardiovascular: Denies chest pain, palpitations and leg swelling.  Gastrointestinal: Denies nausea, vomiting, abdominal pain, diarrhea, constipation, blood in stool and abdominal distention.  Genitourinary: Denies dysuria, urgency, frequency, hematuria, flank pain and difficulty urinating.  Musculoskeletal: Denies myalgias, back pain, joint swelling, arthralgias and gait problem.  Skin: Denies pallor, rash and wound.  Neurological: Denies dizziness, seizures, syncope, weakness, light-headedness, numbness and headaches.  Hematological: Denies adenopathy. Easy bruising, personal or family bleeding history  Psychiatric/Behavioral: Denies suicidal ideation, mood changes, confusion, nervousness, sleep disturbance and agitation   Physical Exam: Blood pressure 112/68, pulse 89, resp. rate 24, height 5' 1.02" (1.55 m), weight 60.6 kg (133 lb 9.6 oz), SpO2 95.00%. General : Awake, confused, in no acute distress HEENT: Pupils equal reactive to light oral mucosa moist, neck JVD noted CVS S1-S2 irregularly irregular rate and rhythm Lungs decreased breath sounds at bases Abdomen soft nontender mildly distended no organomegaly bowel sounds present Extremities trace edema, no  clubbing or cyanosis Skin: Erythema noted in the pubic region Neuro moves all extremities   Labs on Admission:  Results for orders placed during the hospital encounter of 02/07/12 (from the past 48 hour(s))  CBC WITH DIFFERENTIAL     Status: Abnormal   Collection Time   02/07/12  7:28 PM      Component Value Range Comment   WBC 8.8  4.0 - 10.5 K/uL    RBC 3.74 (*) 3.87 - 5.11 MIL/uL    Hemoglobin 12.0  12.0 - 15.0 g/dL    HCT 16.1 (*) 09.6 - 46.0 %    MCV 95.2  78.0 - 100.0 fL     MCH 32.1  26.0 - 34.0 pg    MCHC 33.7  30.0 - 36.0 g/dL    RDW 04.5  40.9 - 81.1 %    Platelets 415 (*) 150 - 400 K/uL    Neutrophils Relative 63  43 - 77 %    Neutro Abs 5.6  1.7 - 7.7 K/uL    Lymphocytes Relative 20  12 - 46 %    Lymphs Abs 1.8  0.7 - 4.0 K/uL    Monocytes Relative 14 (*) 3 - 12 %    Monocytes Absolute 1.2 (*) 0.1 - 1.0 K/uL    Eosinophils Relative 3  0 - 5 %    Eosinophils Absolute 0.3  0.0 - 0.7 K/uL    Basophils Relative 0  0 - 1 %    Basophils Absolute 0.0  0.0 - 0.1 K/uL   COMPREHENSIVE METABOLIC PANEL     Status: Abnormal   Collection Time   02/07/12  7:28 PM      Component Value Range Comment  Sodium 142  135 - 145 mEq/L    Potassium 4.1  3.5 - 5.1 mEq/L    Chloride 105  96 - 112 mEq/L    CO2 24  19 - 32 mEq/L    Glucose, Bld 97  70 - 99 mg/dL    BUN 12  6 - 23 mg/dL    Creatinine, Ser 1.61  0.50 - 1.10 mg/dL    Calcium 9.5  8.4 - 09.6 mg/dL    Total Protein 6.3  6.0 - 8.3 g/dL    Albumin 3.0 (*) 3.5 - 5.2 g/dL    AST 31  0 - 37 U/L    ALT 26  0 - 35 U/L    Alkaline Phosphatase 73  39 - 117 U/L    Total Bilirubin 0.3  0.3 - 1.2 mg/dL    GFR calc non Af Amer 81 (*) >90 mL/min    GFR calc Af Amer >90  >90 mL/min   TROPONIN I     Status: Normal   Collection Time   02/07/12  7:28 PM      Component Value Range Comment   Troponin I <0.30  <0.30 ng/mL   URINALYSIS, ROUTINE W REFLEX MICROSCOPIC     Status: Abnormal   Collection Time   02/07/12  8:39 PM      Component Value Range Comment   Color, Urine RED (*) YELLOW BIOCHEMICALS MAY BE AFFECTED BY COLOR   APPearance CLOUDY (*) CLEAR    Specific Gravity, Urine 1.021  1.005 - 1.030    pH 5.0  5.0 - 8.0    Glucose, UA NEGATIVE  NEGATIVE mg/dL    Hgb urine dipstick NEGATIVE  NEGATIVE    Bilirubin Urine MODERATE (*) NEGATIVE    Ketones, ur 40 (*) NEGATIVE mg/dL    Protein, ur NEGATIVE  NEGATIVE mg/dL    Urobilinogen, UA 4.0 (*) 0.0 - 1.0 mg/dL    Nitrite POSITIVE (*) NEGATIVE    Leukocytes, UA MODERATE  (*) NEGATIVE   URINE MICROSCOPIC-ADD ON     Status: Abnormal   Collection Time   02/07/12  8:39 PM      Component Value Range Comment   Squamous Epithelial / LPF FEW (*) RARE    WBC, UA 7-10  <3 WBC/hpf    Bacteria, UA FEW (*) RARE    Casts HYALINE CASTS (*) NEGATIVE   PROTIME-INR     Status: Normal   Collection Time   02/07/12  8:42 PM      Component Value Range Comment   Prothrombin Time 15.1  11.6 - 15.2 seconds    INR 1.17  0.00 - 1.49     Radiological Exams on Admission: Dg Chest Port 1 View  02/07/2012  *RADIOLOGY REPORT*  Clinical Data: Arrhythmia.  PORTABLE CHEST - 1 VIEW  Comparison: 01/17/2012  Findings: Mild cardiac enlargement.  Negative for heart failure. Mild atelectasis in the lung bases has progressed in the interval. Small pleural effusions have developed.  IMPRESSION: Small pleural effusions and mild bibasilar atelectasis have developed since the prior study.  Negative for edema.   Original Report Authenticated By: Camelia Phenes, M.D.     Assessment/Plan  1. Atrial flutter with rapid ventricular response  status post Cardizem bolus of 20 mg We'll start a Cardizem drip given uncontrolled heart rate   admit to step down unit   check 2-D echocardiogram, TSH and free T4  I suspect this is triggered by CHF/volume overload, clinically volume overloaded and  chest x-ray with pleural effusions   Diurese with Lasix daily as blood pressure tolerates  Receive Lovenox in the emergency room, due to advanced age and dementia I have not started her on therapeutic anticoagulation Aspirin 325 mg daily, Defer anticoagulation to Dr. Debby Bud and further discussion with   2. possible UTI : Recently just completed antibiotic course , follow up urine cultures , empiric ceftriaxone for now   3. advanced dementia with agitation: Continue Ativan PRN with Depakote and Zyprexa   4. DVT: Prophylaxis with Lovenox   CODE STATUS discussed with Daughter Jill Banwart, DO NOT RESUSCITATE  I  have left a voicemail for Dr. Arthur Holms for a pickup 8/23  Time Spent on Admission:  [facility] Pager: [phone] 02/07/2012, 10:44 PM

## 2012-02-07 NOTE — ED Notes (Signed)
Pt to ED via [facility] from skilled nursing home at [facility]. EMS reports they were told by staff that pt's b/p was 76/56 and heart rate was 120.  Also st's pt had decreased LOC.  On arrival to ED pt alert and confused which is her normal state due to dementia.  Pt was placed in this skilled nursing facility on 02/06/12  At 4:48pm.

## 2012-02-07 NOTE — Progress Notes (Signed)
ANTICOAGULATION CONSULT NOTE - Initial Consult  Pharmacy Consult for Lovenox Indication: atrial fibrillation  Allergies  Allergen Reactions  . Codeine Nausea And Vomiting  . Penicillins Rash  . Sulfa Antibiotics Rash    Patient Measurements: Height: 5' 1.02" (155 cm) Weight: 133 lb 9.6 oz (60.6 kg) IBW/kg (Calculated) : 47.86   Vital Signs: BP: 112/68 mmHg (08/22 2015) Pulse Rate: 89  (08/22 2015)  Labs:  Alvira Philips 02/07/12 2042 02/07/12 1928 02/05/12 0620  HGB -- 12.0 11.0*  HCT -- 35.6* 32.3*  PLT -- 415* 270  APTT -- -- --  LABPROT 15.1 -- --  INR 1.17 -- --  HEPARINUNFRC -- -- --  CREATININE -- 0.59 0.51  CKTOTAL -- -- --  CKMB -- -- --  TROPONINI -- <0.30 --    Estimated Creatinine Clearance: 43 ml/min (by C-G formula based on Cr of 0.59).   Medical History: Past Medical History  Diagnosis Date  . CATARACT EXTRACTION, HX OF 06/02/2007  . Celiac disease 06/02/2007  . CHEST WALL PAIN, ANTERIOR 11/24/2009  . Constipation 04/22/2011  . DEMENTIA, HX OF 06/02/2007  . FREQUENCY, URINARY 11/26/2008  . HEARING LOSS 06/02/2007  . Hypertension 04/24/2011  . INSOMNIA, CHRONIC 08/21/2010  . OSTEOPOROSIS 06/02/2007  . Other acquired absence of organ 06/02/2007  . ROTATOR CUFF REPAIR, HX OF 06/02/2007  . STASIS ULCER 06/02/2007  . TACHYARRHYTHMIA 06/02/2007  . Unspecified psychosis 06/02/2007  . UTI (lower urinary tract infection) 04/22/2011  . VITAMIN B12 DEFICIENCY 06/02/2007    Medications:   (Not in a hospital admission)  Assessment: 78 y/o female patient admitted with hypotension and  acute atrial fibrillation requiring full dose anticoagulation.   Goal of Therapy:  Heparin level 0.3-0.7 units/ml Monitor platelets by anticoagulation protocol: Yes   Plan:  Lovenox 60mg  sq q12h. Cbc q72h. Will monitor renal function.  Verlene Mayer, PharmD, BCPS Pager [phone] 02/07/2012,9:37 PM

## 2012-02-07 NOTE — ED Provider Notes (Signed)
Pt is being seen primarily by PA AH.  Am also actively participating in his care.    Date: 02/07/2012  Rate: 160 bpm  Rhythm: atrial fibrillation  QRS Axis: normal  Intervals: sl prolonged QRS duration  ST/T Wave abnormalities: nonspecific ST changes  Conduction Disutrbances:incomplete RBBB  Narrative Interpretation:   Old EKG Reviewed: afib not seen on prev study.    Tobin Chad, MD 02/07/12 [phone]

## 2012-02-08 DIAGNOSIS — I369 Nonrheumatic tricuspid valve disorder, unspecified: Secondary | ICD-10-CM

## 2012-02-08 DIAGNOSIS — M25559 Pain in unspecified hip: Secondary | ICD-10-CM

## 2012-02-08 DIAGNOSIS — J90 Pleural effusion, not elsewhere classified: Secondary | ICD-10-CM

## 2012-02-08 DIAGNOSIS — I4891 Unspecified atrial fibrillation: Principal | ICD-10-CM

## 2012-02-08 LAB — CBC
MCH: 31.6 pg (ref 26.0–34.0)
MCHC: 32.9 g/dL (ref 30.0–36.0)
Platelets: 344 10*3/uL (ref 150–400)

## 2012-02-08 LAB — MRSA PCR SCREENING: MRSA by PCR: NEGATIVE

## 2012-02-08 LAB — BASIC METABOLIC PANEL
Calcium: 8.7 mg/dL (ref 8.4–10.5)
GFR calc non Af Amer: 84 mL/min — ABNORMAL LOW (ref >90)
Glucose, Bld: 79 mg/dL (ref 70–99)
Sodium: 143 mEq/L (ref 135–145)

## 2012-02-08 LAB — TSH: TSH: 4.255 u[IU]/mL (ref 0.350–4.500)

## 2012-02-08 LAB — PRO B NATRIURETIC PEPTIDE: Pro B Natriuretic peptide (BNP): 3164 pg/mL — ABNORMAL HIGH (ref 0–450)

## 2012-02-08 MED ORDER — SODIUM CHLORIDE 0.45 % IV SOLN
INTRAVENOUS | Status: DC
  Administered 2012-02-08 – 2012-02-11 (×2): via INTRAVENOUS
  Administered 2012-02-11: 1000 mL via INTRAVENOUS

## 2012-02-08 MED ORDER — ACETAMINOPHEN 650 MG RE SUPP: 650.0000 mg | Freq: Four times a day (QID) | RECTAL | Status: DC | PRN

## 2012-02-08 MED ORDER — ACETAMINOPHEN 500 MG PO TABS
500.0000 mg | ORAL_TABLET | Freq: Four times a day (QID) | ORAL | Status: DC
  Administered 2012-02-08 – 2012-02-12 (×15): 500 mg via ORAL
  Filled 2012-02-08 (×19): qty 1

## 2012-02-08 MED ORDER — FERROUS SULFATE 325 (65 FE) MG PO TABS
325.0000 mg | ORAL_TABLET | Freq: Every day | ORAL | Status: DC
  Administered 2012-02-08 – 2012-02-12 (×5): 325 mg via ORAL
  Filled 2012-02-08 (×6): qty 1

## 2012-02-08 MED ORDER — SODIUM CHLORIDE 0.9 % IJ SOLN
3.0000 mL | Freq: Two times a day (BID) | INTRAMUSCULAR | Status: DC
  Administered 2012-02-08 – 2012-02-11 (×6): 3 mL via INTRAVENOUS

## 2012-02-08 MED ORDER — ACETAMINOPHEN 325 MG PO TABS
650.0000 mg | ORAL_TABLET | Freq: Four times a day (QID) | ORAL | Status: DC | PRN
  Administered 2012-02-09: 650 mg via ORAL
  Filled 2012-02-08 (×2): qty 2

## 2012-02-08 MED ORDER — TRAMADOL HCL 50 MG PO TABS
50.0000 mg | ORAL_TABLET | Freq: Three times a day (TID) | ORAL | Status: DC
  Administered 2012-02-08 – 2012-02-12 (×13): 50 mg via ORAL
  Filled 2012-02-08 (×15): qty 1

## 2012-02-08 MED ORDER — ONDANSETRON HCL 4 MG/2ML IJ SOLN: 4.0000 mg | Freq: Four times a day (QID) | INTRAMUSCULAR | Status: DC | PRN

## 2012-02-08 MED ORDER — SODIUM CHLORIDE 0.9 % IV BOLUS (SEPSIS)
250.0000 mL | Freq: Once | INTRAVENOUS | Status: AC
  Administered 2012-02-08: 250 mL via INTRAVENOUS

## 2012-02-08 MED ORDER — DILTIAZEM HCL 60 MG PO TABS
60.0000 mg | ORAL_TABLET | Freq: Three times a day (TID) | ORAL | Status: DC
  Administered 2012-02-08 – 2012-02-12 (×13): 60 mg via ORAL
  Filled 2012-02-08 (×15): qty 1

## 2012-02-08 MED ORDER — CEFTRIAXONE SODIUM 1 G IJ SOLR
1.0000 g | Freq: Every day | INTRAMUSCULAR | Status: DC
  Administered 2012-02-08 (×2): 1 g via INTRAVENOUS
  Filled 2012-02-08 (×3): qty 10

## 2012-02-08 MED ORDER — DIVALPROEX SODIUM 125 MG PO CPSP
125.0000 mg | ORAL_CAPSULE | Freq: Two times a day (BID) | ORAL | Status: DC
  Administered 2012-02-08 – 2012-02-12 (×10): 125 mg via ORAL
  Filled 2012-02-08 (×11): qty 1

## 2012-02-08 MED ORDER — OLANZAPINE 2.5 MG PO TABS
2.5000 mg | ORAL_TABLET | Freq: Two times a day (BID) | ORAL | Status: DC
  Administered 2012-02-08 – 2012-02-12 (×10): 2.5 mg via ORAL
  Filled 2012-02-08 (×11): qty 1

## 2012-02-08 MED ORDER — BISACODYL 10 MG RE SUPP: 10.0000 mg | Freq: Every day | RECTAL | Status: DC | PRN

## 2012-02-08 MED ORDER — ACETAMINOPHEN 325 MG PO TABS: 650.0000 mg | ORAL_TABLET | Freq: Four times a day (QID) | ORAL | Status: DC | PRN

## 2012-02-08 MED ORDER — FUROSEMIDE 20 MG PO TABS
20.0000 mg | ORAL_TABLET | Freq: Two times a day (BID) | ORAL | Status: DC
  Administered 2012-02-08 – 2012-02-12 (×9): 20 mg via ORAL
  Filled 2012-02-08 (×10): qty 1

## 2012-02-08 MED ORDER — ONDANSETRON HCL 4 MG PO TABS: 4.0000 mg | ORAL_TABLET | Freq: Four times a day (QID) | ORAL | Status: DC | PRN

## 2012-02-08 MED ORDER — FUROSEMIDE 10 MG/ML IJ SOLN
20.0000 mg | Freq: Every day | INTRAMUSCULAR | Status: DC
  Administered 2012-02-08: 20 mg via INTRAVENOUS
  Filled 2012-02-08: qty 2

## 2012-02-08 MED ORDER — ENOXAPARIN SODIUM 40 MG/0.4ML ~~LOC~~ SOLN
40.0000 mg | SUBCUTANEOUS | Status: DC
  Administered 2012-02-08 – 2012-02-11 (×4): 40 mg via SUBCUTANEOUS
  Filled 2012-02-08 (×5): qty 0.4

## 2012-02-08 MED ORDER — DOCUSATE SODIUM 100 MG PO CAPS
100.0000 mg | ORAL_CAPSULE | Freq: Two times a day (BID) | ORAL | Status: DC
  Administered 2012-02-08 – 2012-02-10 (×6): 100 mg via ORAL
  Filled 2012-02-08 (×10): qty 1

## 2012-02-08 MED ORDER — ASPIRIN 325 MG PO TABS
325.0000 mg | ORAL_TABLET | Freq: Every day | ORAL | Status: DC
  Administered 2012-02-08 – 2012-02-12 (×5): 325 mg via ORAL
  Filled 2012-02-08 (×5): qty 1

## 2012-02-08 MED ORDER — LORAZEPAM 0.5 MG PO TABS
0.5000 mg | ORAL_TABLET | Freq: Four times a day (QID) | ORAL | Status: DC | PRN
  Administered 2012-02-08 – 2012-02-12 (×4): 0.5 mg via ORAL
  Filled 2012-02-08 (×5): qty 1

## 2012-02-08 NOTE — Progress Notes (Signed)
BP 101/87.  HR 97/Afib.  Cardizem remains off.

## 2012-02-08 NOTE — Progress Notes (Signed)
Dr. Jomarie Longs notified of pt's severe agitation, pinching, pulling out piv and spitting.  Also notified of hr still in 150's and that cardizem was not started in e.d. But started here on floor at 0125. Orders received.

## 2012-02-08 NOTE — Progress Notes (Signed)
K. Schorr paged.  Awaiting return call. 

## 2012-02-08 NOTE — Progress Notes (Signed)
Utilization review completed.  

## 2012-02-08 NOTE — Progress Notes (Signed)
Subjective: Jill Mckinney was d/c to [facility] August 21 after completing a 5 day course of Rocephin for UTI. She was hemodynamically stable for >24 hrs prior to discharge. Her agitated dementia was better controlled on zyprexa with sparing use of ativan. At the SNF she was found to be hypotensive with rapid heart rate. Ed evaluation revealed a/fib/flutter with RVR. CXR w/o pulmonary edema but small pleural effusion changed from previous study August 1. She was treated with IV CCB and IV lasix. UA was notable for 7-10 WBC and rare bacteria - she was restarted on Rocephin.  Objective: Lab: Lab Results  Component Value Date   WBC 7.0 02/08/2012   HGB 11.6* 02/08/2012   HCT 35.3* 02/08/2012   MCV 96.2 02/08/2012   PLT 344 02/08/2012   BMET    Component Value Date/Time   NA 143 02/08/2012 0515   K 3.9 02/08/2012 0515   CL 105 02/08/2012 0515   CO2 25 02/08/2012 0515   GLUCOSE 79 02/08/2012 0515   BUN 10 02/08/2012 0515   CREATININE 0.53 02/08/2012 0515   CALCIUM 8.7 02/08/2012 0515   GFRNONAA 84* 02/08/2012 0515   GFRAA >90 02/08/2012 0515  Trop I <0.30; pBNP 3164; CBCD 8/22 WBC 8.8 w/ normal diff.; TSH 4.255 (4.25 on August 17th)  EKG 02/07/12 - fib/flutter, incomplete RBBB, Ist degree AVB  Imaging: CXR 02/07/12: IMPRESSION:  Small pleural effusions and mild bibasilar atelectasis have  developed since the prior study. Negative for edema.    Scheduled Meds:   . aspirin  325 mg Oral Daily  . cefTRIAXone (ROCEPHIN)  IV  1 g Intravenous QHS  . diltiazem  20 mg Intravenous Once  . divalproex  125 mg Oral BID  . docusate sodium  100 mg Oral BID  . enoxaparin (LOVENOX) injection  40 mg Subcutaneous Q24H  . ferrous sulfate  325 mg Oral Q breakfast  . furosemide  20 mg Intravenous Daily  . furosemide  40 mg Intravenous Once  . OLANZapine  2.5 mg Oral BID  . sodium chloride  1,000 mL Intravenous Once  . sodium chloride  250 mL Intravenous Once  . sodium chloride  3 mL Intravenous Q12H  . DISCONTD:  enoxaparin (LOVENOX) injection  60 mg Subcutaneous Q12H   Continuous Infusions:   . diltiazem (CARDIZEM) infusion Stopped (02/08/12 0330)   PRN Meds:.acetaminophen, acetaminophen, bisacodyl, LORazepam, ondansetron (ZOFRAN) IV, ondansetron, DISCONTD: acetaminophen   Physical Exam: Filed Vitals:   02/08/12 1227  BP: 89/56  Pulse: 138  Temp: 98.4 F (36.9 C)  Resp: 21   Tele: a fib/flutter rate 129  Intake/Output Summary (Last 24 hours) at 02/08/12 1256 Last data filed at 02/08/12 0430  Gross per 24 hour  Intake    300 ml  Output   2725 ml  Net  -2425 ml   Gen'l - frail elderly white woman who is awake and agitated HEENT - [place]/AT, edentulous, eyes not examined - won't open eyes Cor - IRIR rate, auscultation impossible due to continuous phonation PUlm - no incrased WOB, no rales or wheezes noted Abd- BS+ , soft, no guarding or rebound Ext- no deformity Neuro - awake, agitated with continuous phonation that is most unintelligible but she does c/o hip pain      Assessment/Plan: 1. Cardiac - fib/flutter with RVR - BP is a little soft now. She has a remote h/o ablation for flutter. She had been on norvasc for BP control. Cardiac enzymes negative, no acute ischemic changes on EKG, labs  OK with normal K, TSH. Plan D/c norvasc  Start cardiazem 60 mg tid   2. Fluid over load - modest pBNP, no pulmonary edema on CXR. She has diuresed 2.4L and now has very little UOP. BUN/CR stable Plan - oral furosemide 20 mg BID  3. ID - question of recurrent UTI. WBC normal with normal Diff. U/A moderate with few WBC and rare bacteria. Urine culture pending. Restarted on Rocephin - had 5 days of therapy at last admission, last dose 02/06/12. Plan - continue rocephin pending urine culture results.  4. Hip pain - had no fracture at previous admission. She did have PT evaluation. Plan Scheduled APAP 500 mg q 6  Tramadol 50 mg tid  5. Dementia - no change from baseline, although hard to  assess. Plan  Continue zyprexa 2.5 bid  Very careful use of ativan.  6. F/E/N - had been taking oral diet at home. Last admission she did have a question of dehydration. No evidence of overt heart failure now. Plan Mechanical soft diet with feeding assistance  IVF - 1/2 NS at 50 cc /hr  MOM 15 cc daily to maintain bowel habit.  Transfer to tele bed Code status - DNR   Jill Mckinney IM Call-grp - [facility] (o) [phone]; [phone]  02/08/2012, 12:49 PM

## 2012-02-08 NOTE — Progress Notes (Signed)
Kirtland Bouchard Schorr paged.  Awaiting return call.

## 2012-02-08 NOTE — Progress Notes (Signed)
  Echocardiogram 2D Echocardiogram has been performed.  Jill Mckinney 02/08/2012, 3:40 PM

## 2012-02-08 NOTE — Progress Notes (Signed)
K. Schorr paged and updated on pt's reason for admission and events of this shift.  HR now 80-90's with BP 68/40, 78/39, 93/47 for past hour.  Cardizem has been turned off d/t BP.  Pt is now calm and sleeping after receiving po ativan 0.5mg , zyprexa and depakote.  Orders received for 250 fluid bolus.

## 2012-02-09 ENCOUNTER — Inpatient Hospital Stay (HOSPITAL_COMMUNITY): Payer: Medicare Other

## 2012-02-09 DIAGNOSIS — E876 Hypokalemia: Secondary | ICD-10-CM

## 2012-02-09 DIAGNOSIS — K5909 Other constipation: Secondary | ICD-10-CM

## 2012-02-09 DIAGNOSIS — I10 Essential (primary) hypertension: Secondary | ICD-10-CM

## 2012-02-09 LAB — BASIC METABOLIC PANEL
BUN: 14 mg/dL (ref 6–23)
CO2: 29 mEq/L (ref 19–32)
Calcium: 9 mg/dL (ref 8.4–10.5)
Creatinine, Ser: 0.58 mg/dL (ref 0.50–1.10)

## 2012-02-09 LAB — URINE CULTURE: Colony Count: NO GROWTH

## 2012-02-09 MED ORDER — MAGNESIUM HYDROXIDE 400 MG/5ML PO SUSP: 15.0000 mL | Freq: Every day | ORAL | Status: DC

## 2012-02-09 MED ORDER — MAGNESIUM HYDROXIDE 400 MG/5ML PO SUSP
15.0000 mL | Freq: Every day | ORAL | Status: DC
  Administered 2012-02-10 – 2012-02-11 (×2): 15 mL via ORAL
  Filled 2012-02-09 (×3): qty 30

## 2012-02-09 MED ORDER — POTASSIUM CHLORIDE CRYS ER 20 MEQ PO TBCR
40.0000 meq | EXTENDED_RELEASE_TABLET | Freq: Two times a day (BID) | ORAL | Status: AC
  Administered 2012-02-09 (×2): 40 meq via ORAL
  Filled 2012-02-09 (×2): qty 2

## 2012-02-09 MED ORDER — MAGNESIUM HYDROXIDE 400 MG/5ML PO SUSP
30.0000 mL | ORAL | Status: AC
  Administered 2012-02-09: 30 mL via ORAL

## 2012-02-09 NOTE — Progress Notes (Signed)
Subjective: Easily awakened - starts c/o hip pain. No signs of distress, mildly sedated.  Objective: Lab: Lab Results  Component Value Date   WBC 7.0 02/08/2012   HGB 11.6* 02/08/2012   HCT 35.3* 02/08/2012   MCV 96.2 02/08/2012   PLT 344 02/08/2012   BMET    Component Value Date/Time   NA 144 02/09/2012 0505   K 3.0* 02/09/2012 0505   CL 102 02/09/2012 0505   CO2 29 02/09/2012 0505   GLUCOSE 122* 02/09/2012 0505   BUN 14 02/09/2012 0505   CREATININE 0.58 02/09/2012 0505   CALCIUM 9.0 02/09/2012 0505   GFRNONAA 82* 02/09/2012 0505   GFRAA >90 02/09/2012 0505   Tele: A fib/flutter rate varies - as high as 130, currently controlled. BP as low as SBP 70's currently stable. O2 sats running high 80's  Imaging:  Scheduled Meds:   . acetaminophen  500 mg Oral QID  . aspirin  325 mg Oral Daily  . cefTRIAXone (ROCEPHIN)  IV  1 g Intravenous QHS  . diltiazem  60 mg Oral Q8H  . divalproex  125 mg Oral BID  . docusate sodium  100 mg Oral BID  . enoxaparin (LOVENOX) injection  40 mg Subcutaneous Q24H  . ferrous sulfate  325 mg Oral Q breakfast  . furosemide  20 mg Oral BID  . OLANZapine  2.5 mg Oral BID  . sodium chloride  3 mL Intravenous Q12H  . traMADol  50 mg Oral TID  . DISCONTD: furosemide  20 mg Intravenous Daily   Continuous Infusions:   . sodium chloride 50 mL/hr at 02/08/12 1359  . DISCONTD: diltiazem (CARDIZEM) infusion Stopped (02/08/12 0330)   PRN Meds:.acetaminophen, acetaminophen, bisacodyl, LORazepam, ondansetron (ZOFRAN) IV, ondansetron   Physical Exam: Filed Vitals:   02/09/12 0832  BP: 107/54  Pulse: 118  Temp: 97.5 F (36.4 C)  Resp: 32    Intake/Output Summary (Last 24 hours) at 02/09/12 0920 Last data filed at 02/09/12 0846  Gross per 24 hour  Intake 530.83 ml  Output   1125 ml  Net -594.17 ml   Total this adm: -3L Gen'l - very frail, demented white woman in no acute distress HEENT- oral membranes dry Chest - shallow breath sounds, no rales or  wheezes Cor - IRIR with RVR, no JVD Abd - BS+ soft, no guarding or rebound Neuro - very demented, non-focal exam     Assessment/Plan: 1. Cardiac - remains in a.fib with rvr but better controlled. Plan Continue present dose of diltiazem - may increase if rate stays up  2. Fluid overload - good diuresis. BUN/Cr remain stable. Low O2 may be due more to poor inspiration than fluid overload. Plan F/u PA/Lat cxr.  Reduce lasix to qD  3. ID - Urine culture negative to-date. Last WBC normal and diff was normal. No fever. Plan  D/c/ rocephin  4. Hip pain - had previous x-ray. Seems to move well. Tolerating tramadol.  5. Dementia - seems better controlled on zyprexa - will continue.  6. F/E/N  K+ 3.0. Does take meals and ensure with help. Has not had BM Plan Oral replacement of K  Continue IVF for one more day  Increase MOM   Illene Regulus [facility] (o) [phone]; (c) [phone] Call-grp - Patsi Sears IM Tele: [phone]  02/09/2012, 9:18 AM

## 2012-02-10 LAB — GLUCOSE, CAPILLARY: Glucose-Capillary: 143 mg/dL — ABNORMAL HIGH (ref 70–99)

## 2012-02-10 LAB — BASIC METABOLIC PANEL
BUN: 9 mg/dL (ref 6–23)
CO2: 28 mEq/L (ref 19–32)
Chloride: 102 mEq/L (ref 96–112)
GFR calc Af Amer: >90 mL/min (ref >90)
Potassium: 4.1 mEq/L (ref 3.5–5.1)

## 2012-02-10 LAB — URINALYSIS, ROUTINE W REFLEX MICROSCOPIC
Protein, ur: NEGATIVE mg/dL
Urobilinogen, UA: 0.2 mg/dL (ref 0.0–1.0)

## 2012-02-10 LAB — URINE MICROSCOPIC-ADD ON

## 2012-02-10 NOTE — Progress Notes (Signed)
Subjective: Resting quietly, arouse to exam. Per RN she has been stable. Her buttock/perineum is very raw - using barrier cream. She has not required any ativan.  Objective: Lab: Lab Results  Component Value Date   WBC 7.0 02/08/2012   HGB 11.6* 02/08/2012   HCT 35.3* 02/08/2012   MCV 96.2 02/08/2012   PLT 344 02/08/2012   BMET    Component Value Date/Time   NA 140 02/10/2012 0518   K 4.1 02/10/2012 0518   CL 102 02/10/2012 0518   CO2 28 02/10/2012 0518   GLUCOSE 84 02/10/2012 0518   BUN 9 02/10/2012 0518   CREATININE 0.53 02/10/2012 0518   CALCIUM 9.0 02/10/2012 0518   GFRNONAA 84* 02/10/2012 0518   GFRAA >90 02/10/2012 0518     Imaging:  2 view CXR 8/24: IMPRESSION:  Slightly improved aeration at the left lung base.  Persistent prominent interstitial lung markings.  Scheduled Meds:   . acetaminophen  500 mg Oral QID  . aspirin  325 mg Oral Daily  . diltiazem  60 mg Oral Q8H  . divalproex  125 mg Oral BID  . docusate sodium  100 mg Oral BID  . enoxaparin (LOVENOX) injection  40 mg Subcutaneous Q24H  . ferrous sulfate  325 mg Oral Q breakfast  . furosemide  20 mg Oral BID  . magnesium hydroxide  15 mL Oral Daily  . magnesium hydroxide  30 mL Oral Q4H  . OLANZapine  2.5 mg Oral BID  . potassium chloride  40 mEq Oral BID  . sodium chloride  3 mL Intravenous Q12H  . traMADol  50 mg Oral TID   Continuous Infusions:   . sodium chloride 50 mL/hr at 02/09/12 1900   PRN Meds:.acetaminophen, acetaminophen, bisacodyl, LORazepam, ondansetron (ZOFRAN) IV, ondansetron   Physical Exam: Filed Vitals:   02/10/12 0500  BP: 137/83  Pulse: 121  Temp: 98 F (36.7 C)  Resp: 18    Intake/Output Summary (Last 24 hours) at 02/10/12 1056 Last data filed at 02/10/12 0758  Gross per 24 hour  Intake   1120 ml  Output   1351 ml  Net   -231 ml   IO total this adm - -2.7 L  Gen'l - very elderly frail white woman in no active distress HEENT- mucus membranes dry Cor - regualr  bradycardia on exam (slow a. Fib on telemetry) widely split S2 Pulm - normal respirations, no increased WOB, no wheeze or rales Abd- soft Derm - not exmined but report taken from RN  Assessment/Plan: 1. Cardiac - rate controlled a. Fib on oral diltiazem. BP is stable Plan Continue present medical regimen  2. Fluid overload - has had a good diuresis. CXR with improved aeratin.  Plan- Continue oral dose of furosemide  3. ID - off antibiotics, no fever.  Plan- U/A for follow-up, test for cure  4. Hip pain - continued complaint from time to time. No outward rotation of the leg.  Plan  PT consult to see if she can manage weight bearing.  For continued pain and inability to bear weight may need MRI to r/o occult fracture  5. Dementia - stable on zyprexa. Has not required ativan.  6. F/E/N - potassium corrected. 1 BM recorded for 8/24  Dispo - if rate holds and hip evaluation is negative will be ready for SNF   [employer] IM (o) [phone]; (c) [phone] Call-grp - Patsi Sears IM Tele: [phone]  02/10/2012, 10:55 AM

## 2012-02-11 MED ORDER — ENSURE PUDDING PO PUDG
1.0000 | Freq: Three times a day (TID) | ORAL | Status: DC
  Administered 2012-02-11: 1 via ORAL

## 2012-02-11 NOTE — Clinical Social Work Psychosocial (Signed)
     Clinical Social Work Department BRIEF PSYCHOSOCIAL ASSESSMENT 02/11/2012  Patient:  Jill Mckinney, Jill Mckinney     Account Number:  [id number]     Admit date:  02/07/2012  Clinical Social Worker:  Doree Albee  Date/Time:  02/11/2012 04:43 PM  Referred by:  RN  Date Referred:  02/11/2012 Referred for  SNF Placement   Other Referral:   Interview type:  Family Other interview type:    PSYCHOSOCIAL DATA Living Status:  FACILITY Admitted from facility:  [facility] Level of care:  Skilled Nursing Facility Primary support name:  Jill Mckinney Primary support relationship to patient:  SPOUSE Degree of support available:   strong    CURRENT CONCERNS Current Concerns  Post-Acute Placement   Other Concerns:   CSW recieved referral that patient was admitted from a facility. CSW spoke with pt son, as pt only oriented to self at this time. Pt spouse shared that pt was discharged from hosptial on 8/21 and readmitted 8/22 from the facility. Pt spouse and pt daughter provide pt care when pt is at home an dwould like pt to return to [facility] when medically stbale.    CSW confirmed with fcaility that patient can return when medically stable.    SOCIAL WORK ASSESSMENT / PLAN  Assessment/plan status:  Psychosocial Support/Ongoing Assessment of Needs Other assessment/ plan:   discharge planning   Information/referral to community resources:   no resources identified at this  time    PATIENTS/FAMILYS RESPONSE TO PLAN OF CARE: Pt spouse thanked csw for concern support and update. Pt it anticipated to return to snf wtomorrow if medically stable.

## 2012-02-11 NOTE — Progress Notes (Signed)
Utilization review completed.  

## 2012-02-11 NOTE — Progress Notes (Signed)
INITIAL ADULT NUTRITION ASSESSMENT Date: 02/11/2012   Time: 3:39 PM  INTERVENTION:  Gluten Free, Dysphagia 3 diet  Ensure Pudding 3 times daily between meals (170 kcals, 4 gm protein per 4 oz cup) RD to follow for nutrition care plan  Reason for Assessment: Malnutrition Screening Tool Report, Low Braden  ASSESSMENT: Female 78 y.o.  Dx: atrial flutter with rapid ventricular response  Hx:  Past Medical History  Diagnosis Date  . CATARACT EXTRACTION, HX OF 06/02/2007  . Celiac disease 06/02/2007  . CHEST WALL PAIN, ANTERIOR 11/24/2009  . Constipation 04/22/2011  . DEMENTIA, HX OF 06/02/2007  . FREQUENCY, URINARY 11/26/2008  . HEARING LOSS 06/02/2007  . Hypertension 04/24/2011  . INSOMNIA, CHRONIC 08/21/2010  . OSTEOPOROSIS 06/02/2007  . Other acquired absence of organ 06/02/2007  . ROTATOR CUFF REPAIR, HX OF 06/02/2007  . STASIS ULCER 06/02/2007  . TACHYARRHYTHMIA 06/02/2007  . Unspecified psychosis 06/02/2007  . UTI (lower urinary tract infection) 04/22/2011  . VITAMIN B12 DEFICIENCY 06/02/2007    Related Meds:     . acetaminophen  500 mg Oral QID  . aspirin  325 mg Oral Daily  . diltiazem  60 mg Oral Q8H  . divalproex  125 mg Oral BID  . docusate sodium  100 mg Oral BID  . enoxaparin (LOVENOX) injection  40 mg Subcutaneous Q24H  . ferrous sulfate  325 mg Oral Q breakfast  . furosemide  20 mg Oral BID  . magnesium hydroxide  15 mL Oral Daily  . OLANZapine  2.5 mg Oral BID  . sodium chloride  3 mL Intravenous Q12H  . traMADol  50 mg Oral TID    Ht: 5\' 1"  (154.9 cm)  Wt: 125 lb 3.5 oz (56.8 kg)  Ideal Wt: 47.7 kg % Ideal Wt: 119%  Usual Wt: 56.2 kg % Usual Wt: 101%  Body mass index is 23.66 kg/(m^2).  Food/Nutrition Related Hx: recent weight loss without trying per admission nutrition screen  Labs:  CMP     Component Value Date/Time   NA 140 02/10/2012 0518   K 4.1 02/10/2012 0518   CL 102 02/10/2012 0518   CO2 28 02/10/2012 0518   GLUCOSE 84 02/10/2012  0518   BUN 9 02/10/2012 0518   CREATININE 0.53 02/10/2012 0518   CALCIUM 9.0 02/10/2012 0518   PROT 6.3 02/07/2012 1928   ALBUMIN 3.0* 02/07/2012 1928   AST 31 02/07/2012 1928   ALT 26 02/07/2012 1928   ALKPHOS 73 02/07/2012 1928   BILITOT 0.3 02/07/2012 1928   GFRNONAA 84* 02/10/2012 0518   GFRAA >90 02/10/2012 0518     Intake/Output Summary (Last 24 hours) at 02/11/12 1540 Last data filed at 02/11/12 1300  Gross per 24 hour  Intake    710 ml  Output    926 ml  Net   -216 ml    CBG (last 3)   Basename 02/10/12 0746  GLUCAP 143*    Diet Order: Dysphagia 3, thin liquids  Supplements/Tube Feeding: N/A  IVF:    sodium chloride Last Rate: 50 mL/hr at 02/11/12 0636    Estimated Nutritional Needs:   Kcal: 1400-1600 Protein: 60-70 gm Fluid: > 1.5 L  Patient admitted for increased agitation; RD spoke with patient's daughter regarding nutrition hx; states she was eating well at her SNF, however, recently her appetite has been poor; family report weight stability; needs Gluten Free diet restrictions; current low braden score of 11 places patient at risk for skin breakdown; would benefit from addition  of nutrition supplements -- RD to order.  NUTRITION DIAGNOSIS: -Inadequate oral intake (NI-2.1).  Status: Ongoing  RELATED TO: dysphagia & dementia  AS EVIDENCE BY: PO intake 10-40%  MONITORING/EVALUATION(Goals): Goal: Oral intake with meals & supplements to meet >/= 90% of estimated nutrition needs Monitor: PO & supplemental intake, weight, labs, I/O's  EDUCATION NEEDS: -No education needs identified at this time  Dietitian #: [phone]  DOCUMENTATION CODES Per approved criteria  -Not Applicable    Alger Memos 02/11/2012, 3:39 PM

## 2012-02-11 NOTE — Evaluation (Signed)
Physical Therapy Evaluation and Discharge Patient Details Name: Jill Mckinney MRN: [id number] DOB: 10/24/1923 Today's Date: 02/11/2012 Time: 4098-1191 PT Time Calculation (min): 16 min  PT Assessment / Plan / Recommendation Clinical Impression  pt rpesents with A-flutter with RVR.  pt with history dementia, multiple falls, FTT.  pt confsued and resistant to maost attempts at A with mobility.  Per old notes pt required A for all aspects of care prior to last admit and that family was having trouble caring for her at home.  pt will need SNF at D/C.  Acute PT will sign off and defer all further PT to SNF.      PT Assessment  All further PT needs can be met in the next venue of care    Follow Up Recommendations  Skilled nursing facility    Barriers to Discharge None      Equipment Recommendations  Defer to next venue    Recommendations for Other Services     Frequency      Precautions / Restrictions Precautions Precautions: Fall Precaution Comments: blind Restrictions Weight Bearing Restrictions: No   Pertinent Vitals/Pain Pt denies pain when asked.        Mobility  Bed Mobility Bed Mobility: Supine to Sit;Sit to Supine Supine to Sit: 1: +1 Total assist Sit to Supine: 1: +1 Total assist Details for Bed Mobility Assistance: pt resistant initially, but then attempts to A with long sitting in bed as pt refused to let LEs over side of bed to sit on EOB.  pt confused and continues to call out and moan despite cues for reassurance.  pt returned to supine.   Transfers Transfers: Not assessed Ambulation/Gait Ambulation/Gait Assistance: Not tested (comment) Stairs: No Wheelchair Mobility Wheelchair Mobility: No    Exercises     PT Diagnosis:    PT Problem List: Decreased activity tolerance;Decreased balance;Decreased mobility;Decreased strength;Decreased cognition;Decreased knowledge of use of DME;Decreased safety awareness PT Treatment Interventions:     PT Goals     Visit Information  Last PT Received On: 02/11/12 Assistance Needed: +2    Subjective Data  Subjective: "When's that man taking me to his house?"   Patient Stated Goal: Unable to state.     Prior Functioning  Home Living Additional Comments: Per old eval pt lived at home with her husband and pt was requiring A with all aspects of care, but it is unclear if pt is bed bound or if she could walk or use W/C.  pt D/C to [facility] for only 1 day before [facility].   Communication Communication: Receptive difficulties;Expressive difficulties;HOH    Cognition  Overall Cognitive Status: History of cognitive impairments - at baseline Arousal/Alertness:  (Awake) Orientation Level: Disoriented to;Place;Time;Situation Behavior During Session: Restless Cognition - Other Comments: pt with Dementia at Baseline, unclear pt baseline cognition.  pt calling out, not following most 1 step directions.      Extremity/Trunk Assessment Right Lower Extremity Assessment RLE ROM/Strength/Tone: Unable to fully assess;Due to impaired cognition Left Lower Extremity Assessment LLE ROM/Strength/Tone: Unable to fully assess;Due to impaired cognition   Balance Balance Balance Assessed: No  End of Session PT - End of Session Activity Tolerance:  (Limited by cognition) Patient left: in bed;with call bell/phone within reach;with bed alarm set Nurse Communication: Mobility status  GP     Sunny Schlein, [place] [phone] 02/11/2012, 1:52 PM

## 2012-02-11 NOTE — Clinical Documentation Improvement (Signed)
CHF DOCUMENTATION CLARIFICATION QUERY  THIS DOCUMENT IS NOT A PERMANENT PART OF THE MEDICAL RECORD  TO RESPOND TO THE THIS QUERY, FOLLOW THE INSTRUCTIONS BELOW:  1. If needed, update documentation for the patients encounter via the notes activity.  2. Access this query again and click edit on the In [employer].  3. After updating, or not, click F2 to complete all highlighted (required) fields concerning your review. Select "additional documentation in the medical record" OR "no additional documentation provided".  4. Click Sign note button.  5. The deficiency will fall out of your In Basket *Please let us know if you are not able to complete this workflow by phone or e-mail (listed below).  Please update your documentation within the medical record to reflect your response to this query.                                                                                    02/11/12  Dear Dr. Illene Regulus / Associates,  In a better effort to capture your patient's severity of illness, reflect appropriate length of stay and utilization of resources, a review of the patient medical record has revealed the following indicators the diagnosis of Heart Failure.    Based on your clinical judgment, please clarify and document in a progress note and/or discharge summary the clinical condition associated with the following supporting information:  In responding to this query please exercise your independent judgment.  The fact that a query is asked, does not imply that any particular answer is desired or expected.  Possible Clinical Conditions?  Chronic Systolic Congestive Heart Failure Chronic Diastolic Congestive Heart Failure Chronic Systolic & Diastolic Congestive Heart Failure Acute Systolic Congestive Heart Failure Acute Diastolic Congestive Heart Failure Acute Systolic & Diastolic Congestive Heart Failure Acute on Chronic Systolic Congestive Heart Failure Acute on Chronic Diastolic  Congestive Heart Failure Acute on Chronic Systolic & Diastolic  Congestive Heart Failure Other Condition________________________________________ Cannot Clinically Determine  Supporting Information:  Risk Factors: Fluid overload.  Signs & Symptoms: Per 8/26 progress notes Fluid overload resolved.  Per 8/25 progress notes Fluid overload - has had a good diuresis.    Per 8/24 progress notes Fluid overload - good diuresis. BUN/Cr remain stable. Low O2 may be due more to poor inspiration than fluid overload.   Per 8/23 progress notes Fluid over load - modest pBNP, no pulmonary edema on CXR. She has diuresed 2.4L and now has very little UOP. BUN/CR stable.    Diagnostics: Chest Xray.   Treatment: po Lasix   Reviewed: additional documentation in the medical record.   MD did not answer query on the query form but did make a statement regarding the fluid overload in d/c summary.     Thank You,  Shelda Pal RN, BSN, CCM   Clinical Documentation Specialist:  Pager [phone]   Health Information Management [place]

## 2012-02-11 NOTE — Progress Notes (Signed)
Subjective: Sleeping and did not awaken. No reports of any adverse events   Objective: Lab: Lab Results  Component Value Date   WBC 7.0 02/08/2012   HGB 11.6* 02/08/2012   HCT 35.3* 02/08/2012   MCV 96.2 02/08/2012   PLT 344 02/08/2012   BMET    Component Value Date/Time   NA 140 02/10/2012 0518   K 4.1 02/10/2012 0518   CL 102 02/10/2012 0518   CO2 28 02/10/2012 0518   GLUCOSE 84 02/10/2012 0518   BUN 9 02/10/2012 0518   CREATININE 0.53 02/10/2012 0518   CALCIUM 9.0 02/10/2012 0518   GFRNONAA 84* 02/10/2012 0518   GFRAA >90 02/10/2012 0518   Telemetry reviewed- holding sinus rhythm, rate controlled  Imaging: no new imaging  Scheduled Meds:   . acetaminophen  500 mg Oral QID  . aspirin  325 mg Oral Daily  . diltiazem  60 mg Oral Q8H  . divalproex  125 mg Oral BID  . docusate sodium  100 mg Oral BID  . enoxaparin (LOVENOX) injection  40 mg Subcutaneous Q24H  . ferrous sulfate  325 mg Oral Q breakfast  . furosemide  20 mg Oral BID  . magnesium hydroxide  15 mL Oral Daily  . OLANZapine  2.5 mg Oral BID  . sodium chloride  3 mL Intravenous Q12H  . traMADol  50 mg Oral TID   Continuous Infusions:   . sodium chloride 50 mL/hr at 02/11/12 0636   PRN Meds:.acetaminophen, acetaminophen, bisacodyl, LORazepam, ondansetron (ZOFRAN) IV, ondansetron   Physical Exam: Filed Vitals:   02/11/12 0500  BP: 115/61  Pulse: 77  Temp: 97.9 F (36.6 C)  Resp: 18   Elderly white woman - sleeping Cor- 2+ radial pulse, RRR Pulm - normal respiratinos w/o increased WOB Abd- soft     Assessment/Plan: 1. Cardiac - back in sinus rhythm. BP is stable. Plan Continue diltiazem 60 mg tid.  2. Fluid overload - resolved.   Intake/Output Summary (Last 24 hours) at 02/11/12 0706 Last data filed at 02/11/12 0500  Gross per 24 hour  Intake    880 ml  Output    676 ml  Net    204 ml   3. ID - afebrile. Follow -up U/A 8/25 - negative  4. Hip pain - PT eval pending.  5. Dementia - stable  on zyprexa  6. F/E/N - no new lab Plan Follow-up [facility] in AM  Dispo - should be ready to retrun to [facility] Tuesday, August 27th   Illene Regulus  IM (o) [phone]; (c) [phone] Call-grp - Patsi Sears IM Tele: [phone]  02/11/2012, 7:03 AM

## 2012-02-12 DIAGNOSIS — K900 Celiac disease: Secondary | ICD-10-CM

## 2012-02-12 LAB — BASIC METABOLIC PANEL
BUN: 9 mg/dL (ref 6–23)
Calcium: 8.9 mg/dL (ref 8.4–10.5)
Creatinine, Ser: 0.53 mg/dL (ref 0.50–1.10)
GFR calc Af Amer: >90 mL/min (ref >90)
GFR calc non Af Amer: 84 mL/min — ABNORMAL LOW (ref >90)

## 2012-02-12 MED ORDER — DILTIAZEM HCL 60 MG PO TABS: 60.0000 mg | ORAL_TABLET | Freq: Three times a day (TID) | ORAL | Status: DC

## 2012-02-12 MED ORDER — TRAMADOL HCL 50 MG PO TABS: 50.0000 mg | ORAL_TABLET | Freq: Three times a day (TID) | ORAL | Status: AC

## 2012-02-12 MED ORDER — FUROSEMIDE 20 MG PO TABS: 20.0000 mg | ORAL_TABLET | Freq: Every day | ORAL | Status: DC

## 2012-02-12 MED ORDER — ENSURE PUDDING PO PUDG: 1.0000 | Freq: Three times a day (TID) | ORAL | Status: DC

## 2012-02-12 MED ORDER — MAGNESIUM HYDROXIDE 400 MG/5ML PO SUSP: 15.0000 mL | Freq: Every day | ORAL | Status: AC

## 2012-02-12 NOTE — Progress Notes (Signed)
.  Clinical social worker assisted with patient discharge to skilled nursing facility, [facility]. .Patient transportation provided by [employer] [facility] with patient chart copy. .No further Clinical Social Work needs, signing off. Marland Kitchen    Catha Gosselin, [name]  [phone] .02/12/2012 1526pm

## 2012-02-12 NOTE — Progress Notes (Signed)
Assumed care of pt at this time.  Report taken from [place], [place].  Pt awaiting dc to snf today.  Called report to [employer].  Unable to educate pt d/t demented state.  DC packet prepared to send to facility.

## 2012-02-12 NOTE — Discharge Summary (Signed)
Jill Mckinney, Jill Mckinney NO.:  [id number]  MEDICAL RECORD NO.:  [id number]  LOCATION:  [id number]                        FACILITY:  [facility]  PHYSICIAN:  Rosalyn Gess. Kemet Nijjar, MD  DATE OF BIRTH:  1924/03/27  DATE OF ADMISSION:  02/07/2012 DATE OF DISCHARGE:                              DISCHARGE SUMMARY   Patient transferred to skilled care February 12, 2012.  ADMITTING DIAGNOSES: 1. Atrial fibrillation flutter with rapid ventricular response. 2. Persistent urinary tract infection. 3. Advanced dementia.  DISCHARGE DIAGNOSES: 1. Atrial fibrillation flutter with rapid ventricular response. 2. Persistent urinary tract infection. 3. Advanced dementia.  CONSULTANTS:  None.  PROCEDURES: 1. Chest x-ray performed on February 07, 2012, showed small pleural     effusions and mild bibasilar atelectasis.  Negative for edema. 2. Chest x-ray, February 09, 2012, single view which showed slightly     improved aeration of the left lung base, persistent prominent     interstitial lung markings.  HISTORY OF PRESENT ILLNESS:  Ms. Lerette is an 78 year old woman with advanced dementia, blindness, significant hearing loss who was recently admitted to hospital with a urinary tract infection, hypertension, hip pain, and failure to thrive.  Please see discharge summary from that admission dated February 07, 2012.  The patient was transferred to [facility].  When at the facility, she developed atrial fib/flutter with rapid ventricular response and hypotension and was transferred back to the hospital and be admitted.  Please see the H and P as well as previous discharge summary for past medical history, family history, social history, and admission examination.  HOSPITAL COURSE: 1. Cardiac.  The patient was initially put on a Cardizem drip with     rapid control of her rapid ventricular response.  She was converted     over to oral diltiazem 60 mg t.i.d.  On this regimen,  her heart     rate was controlled.  She did spontaneously convert back to sinus     rhythm on the 26th and has been in sinus rhythm for 24 hours, and     has been rate controlled for more than 24 hours.  The patient did     not have any need for further cardiac evaluation and was considered     to be stable.  With her holding a sinus rhythm with rate control,     at this time, she is stable and ready for transfer back to skilled     care. 2. Fluid overload.  Question whether the patient was fluid overloaded.     Chest x-ray never showed any pulmonary edema.  She did have an     elevated P-BNP at 3000.  She was diuresed successfully with her     total output this admission 3500 mL.  Followup chest x-ray revealed     improved aeration and there was no increased work of breathing.     This problem is considered stable.  She will continue on p.o.     diuretics. 3. ID.  Question whether the patient had a persistent UTI.  She had     been treated with Rocephin during her previous hospital stay.  She     was restarted on Rocephin at the time of admission.  The patient     had a stable white blood count and stable differential her entire     hospitalization.  Urinalysis from the 22nd showed red urine from     her Pyridium, cloudy, specific gravity was normal, moderate     leukocyte esterase, 7-10 wbc's per high-powered field and few     bacteria.  With the patient being afebrile from normal white count,     and an ambiguous urine, antibiotics were stopped and followup UA on     the 25th showed that her urine was yellow and clear with specific     gravity of 1.016, 0-2 wbc's, no bacteria.  This problem is     considered resolved. 4. Dementia.  The patient has had a progressive dementia with     significant agitation.  At home, she was constantly phonating and     requiring assistance for bathrooming.  During her last admission,     her medications were changed with Aricept and Namenda being      stopped.  She was started on Zyprexa 2.5 mg b.i.d.  On this     regimen, the patient has done very well.  She has been much calmer     then in the past, although somewhat withdrawn and seems to have     less of a suffering burden.  Plan is to continue Zyprexa. 5. Fluids, electrolytes, and nutrition.  The patient has poor p.o.     intake and has had a drop in BMI.  Her most recent weight was 123     pounds, down from 125 at admission.  BMI was 25.  Transfer the     patient to have p.o. supplements.  With the patient's cardiac     status being stable with a sinus rhythm with controlled rate, with     her other problems being medically stable, she is now ready for     transfer back to skilled care facility.  DISCHARGE PHYSICAL EXAMINATION:  VITAL SIGNS:  Temperature was 98.4, blood pressure 104/69, pulse was 79, respirations 16, O2 sats 92% on room air.  Telemetry revealed the patient to be holding a sinus rhythm for at least the last 20 hours. GENERAL APPEARANCE:  This is an elderly, frail-appearing white woman in no acute distress. HEENT:  Conjunctivae and sclerae were difficult to assess.  The patient does not open her eyes voluntarily.  She is blind.  Oropharynx with very dry mucous membranes secondary to mouth breathing.  She is edentulous. NECK:  Supple.  There is no thyromegaly. CHEST:  No deformities are noted. PULMONARY:  The patient has no increased work of breathing.  There are no rales or wheezes on examination. CARDIOVASCULAR:  2+ radial pulse.  Her precordium was quiet.  She has a 2/6 systolic murmur best at the left sternal border.  The patient is holding a sinus rhythm. BREASTS:  Deferred. ABDOMEN:  Soft.  No guarding or rebound.  No organomegaly or splenomegaly was appreciated. GENITALIA:  Normal external genitalia.  The patient has a Foley catheter which will be discontinued at time of discharge. EXTREMITIES:  No clubbing, cyanosis, or edema.  She does  have interosseous wasting of her hands.  DERMATOLOGIC:  The patient had sacral irritation.  Heels were normal.  No other signs of skin breakdown were noted. NEUROLOGIC:  The patient does respond to stimulus.  She is  not oriented, although it is very difficult to assess with her being blind and very hard of hearing.  She is not agitated at the time of discharge examination.  FINAL LABORATORY DATA:  Chemistries from the day of discharge with a sodium of 138, potassium 4.1, chloride 101, CO2 of 27, BUN 9, creatinine 0.53, glucose was 93.  Calculated GFR is 84.  Final hematology from February 08, 2012, with a white count of 7000, hemoglobin 116 g, platelet count 344,000.  Differential at admission with a white count of 8800 with 63% segs, 20% lymphs, 14% monocytes.  Endocrine, the patient had a TSH done on February 07, 2012, of 4.25, free T4 was 1.47.  Microbiology MRSA by PCR was negative.  Urine culture from the 22nd was no growth.  CODE STATUS:  The patient is a DNR.  Most form has not been completed, but she is a poor candidate for heroic measures.  Condition at discharge is medically stable with a very poor prognosis given her advanced age and multiple medical problems.     Rosalyn Gess Veena Sturgess, MD     MEN/MEDQ  D:  02/12/2012  T:  02/12/2012  Job:  [id number]

## 2012-03-05 ENCOUNTER — Ambulatory Visit: Payer: Medicare Other | Admitting: Cardiovascular Disease

## 2012-03-07 ENCOUNTER — Emergency Department (HOSPITAL_COMMUNITY): Payer: Medicare Other

## 2012-03-07 ENCOUNTER — Encounter (HOSPITAL_COMMUNITY): Payer: Self-pay | Admitting: Emergency Medicine

## 2012-03-07 ENCOUNTER — Observation Stay (HOSPITAL_COMMUNITY)
Admission: EM | Admit: 2012-03-07 | Discharge: 2012-03-09 | Disposition: A | Discharge status: Home / Self Care | Payer: Medicare Other | Attending: Internal Medicine | Admitting: Internal Medicine

## 2012-03-07 DIAGNOSIS — N390 Urinary tract infection, site not specified: Secondary | ICD-10-CM

## 2012-03-07 DIAGNOSIS — R5381 Other malaise: Secondary | ICD-10-CM

## 2012-03-07 DIAGNOSIS — I4892 Unspecified atrial flutter: Secondary | ICD-10-CM

## 2012-03-07 DIAGNOSIS — K5904 Chronic idiopathic constipation: Secondary | ICD-10-CM

## 2012-03-07 DIAGNOSIS — R5383 Other fatigue: Secondary | ICD-10-CM

## 2012-03-07 DIAGNOSIS — D72829 Elevated white blood cell count, unspecified: Secondary | ICD-10-CM

## 2012-03-07 DIAGNOSIS — M25559 Pain in unspecified hip: Secondary | ICD-10-CM | POA: Insufficient documentation

## 2012-03-07 DIAGNOSIS — R7989 Other specified abnormal findings of blood chemistry: Secondary | ICD-10-CM

## 2012-03-07 DIAGNOSIS — I4891 Unspecified atrial fibrillation: Principal | ICD-10-CM

## 2012-03-07 DIAGNOSIS — M549 Dorsalgia, unspecified: Secondary | ICD-10-CM | POA: Insufficient documentation

## 2012-03-07 DIAGNOSIS — H543 Unqualified visual loss, both eyes: Secondary | ICD-10-CM | POA: Insufficient documentation

## 2012-03-07 DIAGNOSIS — F209 Schizophrenia, unspecified: Secondary | ICD-10-CM | POA: Insufficient documentation

## 2012-03-07 DIAGNOSIS — R000 Tachycardia, unspecified: Secondary | ICD-10-CM | POA: Insufficient documentation

## 2012-03-07 DIAGNOSIS — F29 Unspecified psychosis not due to a substance or known physiological condition: Secondary | ICD-10-CM

## 2012-03-07 DIAGNOSIS — G319 Degenerative disease of nervous system, unspecified: Secondary | ICD-10-CM | POA: Insufficient documentation

## 2012-03-07 DIAGNOSIS — Z8659 Personal history of other mental and behavioral disorders: Secondary | ICD-10-CM

## 2012-03-07 DIAGNOSIS — H9190 Unspecified hearing loss, unspecified ear: Secondary | ICD-10-CM

## 2012-03-07 DIAGNOSIS — K900 Celiac disease: Secondary | ICD-10-CM | POA: Diagnosis present

## 2012-03-07 DIAGNOSIS — I10 Essential (primary) hypertension: Secondary | ICD-10-CM

## 2012-03-07 DIAGNOSIS — F0390 Unspecified dementia without behavioral disturbance: Secondary | ICD-10-CM

## 2012-03-07 DIAGNOSIS — Z7382 Dual sensory impairment: Secondary | ICD-10-CM | POA: Insufficient documentation

## 2012-03-07 DIAGNOSIS — R627 Adult failure to thrive: Secondary | ICD-10-CM

## 2012-03-07 LAB — URINALYSIS, ROUTINE W REFLEX MICROSCOPIC
Bilirubin Urine: NEGATIVE
Glucose, UA: NEGATIVE mg/dL
Ketones, ur: NEGATIVE mg/dL
Protein, ur: NEGATIVE mg/dL
pH: 6 (ref 5.0–8.0)

## 2012-03-07 LAB — CBC WITH DIFFERENTIAL/PLATELET
Basophils Absolute: 0 10*3/uL (ref 0.0–0.1)
Basophils Relative: 0 % (ref 0–1)
MCHC: 33.8 g/dL (ref 30.0–36.0)
Monocytes Absolute: 1.1 10*3/uL — ABNORMAL HIGH (ref 0.1–1.0)
Neutro Abs: 8.1 10*3/uL — ABNORMAL HIGH (ref 1.7–7.7)
Neutrophils Relative %: 75 % (ref 43–77)
Platelets: 446 10*3/uL — ABNORMAL HIGH (ref 150–400)
RDW: 14.2 % (ref 11.5–15.5)

## 2012-03-07 LAB — URINE MICROSCOPIC-ADD ON

## 2012-03-07 LAB — COMPREHENSIVE METABOLIC PANEL
AST: 18 U/L (ref 0–37)
Albumin: 3.3 g/dL — ABNORMAL LOW (ref 3.5–5.2)
Chloride: 99 mEq/L (ref 96–112)
Creatinine, Ser: 0.54 mg/dL (ref 0.50–1.10)
Potassium: 4.2 mEq/L (ref 3.5–5.1)
Total Bilirubin: 0.2 mg/dL — ABNORMAL LOW (ref 0.3–1.2)

## 2012-03-07 LAB — VALPROIC ACID LEVEL: Valproic Acid Lvl: 33.9 ug/mL — ABNORMAL LOW (ref 50.0–100.0)

## 2012-03-07 LAB — AMMONIA: Ammonia: <10 umol/L — ABNORMAL LOW (ref 11–60)

## 2012-03-07 LAB — PROTIME-INR: INR: 1.11 (ref 0.00–1.49)

## 2012-03-07 MED ORDER — CEFTRIAXONE SODIUM 1 G IJ SOLR: 1.0000 g | Freq: Once | INTRAMUSCULAR | Status: DC

## 2012-03-07 MED ORDER — MORPHINE SULFATE 4 MG/ML IJ SOLN
4.0000 mg | Freq: Once | INTRAMUSCULAR | Status: AC
  Administered 2012-03-07: 4 mg via INTRAVENOUS
  Filled 2012-03-07: qty 1

## 2012-03-07 MED ORDER — DILTIAZEM HCL 30 MG PO TABS
30.0000 mg | ORAL_TABLET | Freq: Once | ORAL | Status: AC
  Administered 2012-03-07: 30 mg via ORAL
  Filled 2012-03-07: qty 1

## 2012-03-07 MED ORDER — DEXTROSE 5 % IV SOLN
1.0000 g | Freq: Once | INTRAVENOUS | Status: AC
  Administered 2012-03-07: 1 g via INTRAVENOUS
  Filled 2012-03-07: qty 10

## 2012-03-07 MED ORDER — DILTIAZEM HCL 100 MG IV SOLR
5.0000 mg/h | INTRAVENOUS | Status: DC
  Administered 2012-03-07 (×2): 5 mg/h via INTRAVENOUS

## 2012-03-07 MED ORDER — DILTIAZEM HCL 100 MG IV SOLR
5.0000 mg/h | INTRAVENOUS | Status: DC
  Administered 2012-03-07: 5 mg/h via INTRAVENOUS
  Filled 2012-03-07: qty 100

## 2012-03-07 NOTE — ED Provider Notes (Signed)
History     CSN: [id number]  Arrival date & time 03/07/12  1420   First MD Initiated Contact with Patient 03/07/12 1554      Chief Complaint  Patient presents with  . Back Pain  . Hip Pain    Level 5 caveat - dementia.  Patient with severe dementia and thus history is limited. Family reports the patient was sent home from rehabilitation facility 3 days ago. Since then she has had progressive generalized weakness. They also report that they think she is having pain in her neck back and hips. They deny any fevers, vomiting, or falls.      (Consider location/radiation/quality/duration/timing/severity/associated sxs/prior treatment) Patient is a 78 y.o. female presenting with back pain and hip pain. The history is provided by a relative. No language interpreter was used.  Back Pain  This is a recurrent problem. The current episode started more than 2 days ago. The problem occurs constantly. The problem has been gradually worsening. The pain is associated with no known injury. The pain is present in the lumbar spine. The pain is the same all the time. She has tried nothing for the symptoms. Risk factors include lack of exercise.  Hip Pain    Past Medical History  Diagnosis Date  . CATARACT EXTRACTION, HX OF 06/02/2007  . Celiac disease 06/02/2007  . CHEST WALL PAIN, ANTERIOR 11/24/2009  . Constipation 04/22/2011  . DEMENTIA, HX OF 06/02/2007  . FREQUENCY, URINARY 11/26/2008  . HEARING LOSS 06/02/2007  . Hypertension 04/24/2011  . INSOMNIA, CHRONIC 08/21/2010  . OSTEOPOROSIS 06/02/2007  . Other acquired absence of organ 06/02/2007  . ROTATOR CUFF REPAIR, HX OF 06/02/2007  . STASIS ULCER 06/02/2007  . TACHYARRHYTHMIA 06/02/2007  . Unspecified psychosis 06/02/2007  . UTI (lower urinary tract infection) 04/22/2011  . VITAMIN B12 DEFICIENCY 06/02/2007    Past Surgical History  Procedure Date  . Cardiac electrophysiology mapping and ablation   . Hand surgery   . Rotator cuff repair     . Excision large mole removed from back & breast   . Cholecystectomy   . Tonsillectomy   . Orif ankle dislocation   . Cataract extraction     Family History  Problem Relation Age of Onset  . Stroke Mother   . Lung cancer Father   . Breast cancer Maternal Aunt     History  Substance Use Topics  . Smoking status: Never Smoker   . Smokeless tobacco: Never Used  . Alcohol Use: No    OB History    Grav Para Term Preterm Abortions TAB SAB Ect Mult Living                  Review of Systems  Unable to perform ROS Musculoskeletal: Positive for back pain.    Allergies  Codeine; Penicillins; and Sulfa antibiotics  Home Medications   Current Outpatient Rx  Name Route Sig Dispense Refill  . ACETAMINOPHEN 325 MG PO TABS Oral Take 2 tablets (650 mg total) by mouth every 6 (six) hours as needed (or Fever >/= 101). 60 tablet 11  . ASPIRIN EC 81 MG PO TBEC Oral Take 81 mg by mouth daily.      . SUPER B COMPLEX PO Oral Take 1 tablet by mouth daily.      Marland Kitchen CALCIUM CITRATE + PO Oral Take 2 tablets by mouth 2 (two) times daily.      Marland Kitchen DILTIAZEM HCL 60 MG PO TABS Oral Take 1 tablet (60 mg total)  by mouth every 8 (eight) hours. 90 tablet `11  . DIVALPROEX SODIUM 125 MG PO CPSP Oral Take 125 mg by mouth 2 (two) times daily.    Marland Kitchen DOCUSATE SODIUM 100 MG PO CAPS Oral Take 100 mg by mouth 2 (two) times daily.      Marland Kitchen ENSURE PUDDING PO PUDG Oral Take 1 Container by mouth 3 (three) times daily between meals.    Di Kindle SULFATE 325 (65 FE) MG PO TABS Oral Take 325 mg by mouth daily with breakfast.    . FOLIC ACID 400 MCG PO TABS Oral Take 400 mcg by mouth daily.      . FUROSEMIDE 20 MG PO TABS Oral Take 1 tablet (20 mg total) by mouth daily. 30 tablet   . LORAZEPAM 0.5 MG PO TABS Oral Take 0.5-1 mg by mouth every 6 (six) hours as needed. anxiety    . OLANZAPINE 2.5 MG PO TABS Oral Take 1 tablet (2.5 mg total) by mouth 2 (two) times daily. 60 tablet 11    BP 136/78  Pulse 139  Temp 98.1 F  (36.7 C) (Oral)  Resp 23  SpO2 90%  Physical Exam  Nursing note and vitals reviewed. Constitutional: She appears well-developed and well-nourished. No distress.  HENT:  Head: Normocephalic and atraumatic.  Right Ear: External ear normal.  Left Ear: External ear normal.  Nose: Nose normal.  Mouth/Throat: Oropharynx is clear and moist.  Eyes: Conjunctivae normal and EOM are normal.       Pupils minimally reactive to light bilaterally  Neck: Normal range of motion. Neck supple. No thyromegaly present.  Cardiovascular: Intact distal pulses.  An irregularly irregular rhythm present. Tachycardia present.   Pulmonary/Chest: Effort normal and breath sounds normal. No respiratory distress. She has no wheezes.  Abdominal: Soft. Bowel sounds are normal. She exhibits no distension and no mass. There is no tenderness. There is no rebound and no guarding.  Musculoskeletal: She exhibits no edema and no tenderness.  Neurological: She is alert.  Skin: Skin is warm and dry.    ED Course  Procedures (including critical care time)   Labs Reviewed  CBC WITH DIFFERENTIAL  COMPREHENSIVE METABOLIC PANEL  PROTIME-INR  URINALYSIS, ROUTINE W REFLEX MICROSCOPIC  PRO B NATRIURETIC PEPTIDE  TROPONIN I   Dg Chest 2 View  03/07/2012  *RADIOLOGY REPORT*  Clinical Data: History of elevated heart rate.  History of back pain.  CHEST - 2 VIEW  Comparison: 02/09/2012.  01/17/2012.  Findings: There is a limited respiratory result.  There is minimal cardiac silhouette enlargement. Ectasia and nonaneurysmal calcification of the thoracic aorta are seen.  There is elevation of the right hemidiaphragm.  The infiltrative density in the right medial base may reflect atelectasis or fibrosis and is unchanged.  There is slight increase in reticular markings throughout the lungs. With vascular congestion pattern which is seen more prominently on lateral image.  There is prominence of the fissures which may reflect an element of  subpleural edema or tiny amount of pleural effusion.  There is a vague area of nodularity in the left perihilar region which may reflect overlap of structures but when the patient is able suggest follow-up examination be obtained with deeper respiratory result. The appearance of the bones.  There is slight scoliosis convexity to the right.  Previous lumbar vertebral plasty procedure has been performed. There is a compression fracture in the lower thoracic spine unchanged.  This is probably T11.  IMPRESSION: Limited respiratory result.  Minimal cardiac  silhouette enlargement.  Elevated right hemidiaphragm with right medial basilar atelectasis and infiltrative density without significant change.  Slight increased reticular markings within the lungs with mild vascular congestion pattern.  There is prominence of the fissures on lateral may reflect subpleural edema or tiny amount of pleural fluid.   Original Report Authenticated By: Crawford Givens, M.D.    Dg Pelvis 1-2 Views  03/07/2012  *RADIOLOGY REPORT*  Clinical Data: History of hip pain.  PELVIS - 1-2 VIEW  Comparison: 02/01/2012.  Findings: There is degenerative spondylosis.  There is osteopenic appearance of bones.  No fracture or dislocation is identified. Pelvic phleboliths are present. Joint spaces are preserved. Fecal distention of portions of the colon.  IMPRESSION: No fracture or dislocation.  Osteopenic appearance of the bones. Degenerative spondylosis.   Original Report Authenticated By: Crawford Givens, M.D.    Ct Head Wo Contrast  03/07/2012  *RADIOLOGY REPORT*  Clinical Data: Fall, confusion  CT HEAD WITHOUT CONTRAST,CT CERVICAL SPINE WITHOUT CONTRAST  Technique:  Contiguous axial images were obtained from the base of the skull through the vertex without contrast.,Technique: Multidetector CT imaging of the cervical spine was performed. Multiplanar CT image reconstructions were also generated.  Comparison: 10/02/2007  Findings: No skull fracture is noted.   Paranasal sinuses and mastoid air cells are unremarkable.  No intracranial hemorrhage, mass effect or midline shift.  No acute infarction.  No mass lesion is noted on this unenhanced scan.  Stable periventricular and subcortical chronic white matter decreased attenuation consistent with white matter chronic ischemic changes.  Stable cerebral atrophy.  IMPRESSION: No acute intracranial abnormality.  Stable atrophy and chronic white matter disease.  CT cervical spine without IV contrast:  Axial images of the cervical spine shows no acute fracture or subluxation.  Reversal of cervical lordosis again noted.  Stable about 4 mm anterior subluxation C3 on C4 vertebral body.  Again noted disc space flattening with anterior and mild posterior spurring at C4-C5 C5-C6 and C6-C7 level.  No prevertebral soft tissue swelling.  Large anterior osteophytes at C5-C6 level again noted.  Cervical airway is patent.  Impression: 1.  No acute fracture or subluxation.  Stable degenerative changes as described above.  Stable about 3.7 mm anterior subluxation C3 on C4 vertebral body.   Original Report Authenticated By: Natasha Mead, M.D.    Ct Cervical Spine Wo Contrast  03/07/2012  *RADIOLOGY REPORT*  Clinical Data: Fall, confusion  CT HEAD WITHOUT CONTRAST,CT CERVICAL SPINE WITHOUT CONTRAST  Technique:  Contiguous axial images were obtained from the base of the skull through the vertex without contrast.,Technique: Multidetector CT imaging of the cervical spine was performed. Multiplanar CT image reconstructions were also generated.  Comparison: 10/02/2007  Findings: No skull fracture is noted.  Paranasal sinuses and mastoid air cells are unremarkable.  No intracranial hemorrhage, mass effect or midline shift.  No acute infarction.  No mass lesion is noted on this unenhanced scan.  Stable periventricular and subcortical chronic white matter decreased attenuation consistent with white matter chronic ischemic changes.  Stable cerebral  atrophy.  IMPRESSION: No acute intracranial abnormality.  Stable atrophy and chronic white matter disease.  CT cervical spine without IV contrast:  Axial images of the cervical spine shows no acute fracture or subluxation.  Reversal of cervical lordosis again noted.  Stable about 4 mm anterior subluxation C3 on C4 vertebral body.  Again noted disc space flattening with anterior and mild posterior spurring at C4-C5 C5-C6 and C6-C7 level.  No prevertebral soft tissue swelling.  Large anterior osteophytes at C5-C6 level again noted.  Cervical airway is patent.  Impression: 1.  No acute fracture or subluxation.  Stable degenerative changes as described above.  Stable about 3.7 mm anterior subluxation C3 on C4 vertebral body.   Original Report Authenticated By: Natasha Mead, M.D.       Date: 03/07/2012  Rate: 137  Rhythm: atrial fibrillation  QRS Axis: normal  Intervals: afib - Qtc 489  ST/T Wave abnormalities: nonspecific ST changes  Conduction Disutrbances:right bundle branch block  Narrative Interpretation:   Old EKG Reviewed: unchanged    1. Urinary tract infection   2. Leukocytosis   3. Elevated brain natriuretic peptide (BNP) level   4. Atrial fibrillation with RVR       MDM   Patient is 78 year old female who presents to the emergency department with complaints of neck/back pain, decreased urinary output, and generalized weakness. Upon arrival in the emergency department patient noted to be afebrile and vital signs remarkable for initial heart rate of 104. EKG obtained and remarkable for A. fib with RVR. On exam patient oriented only to person, no focal neurological deficits noted, and no signs of focal infection. Patient was given diltiazem 10 mg IV followed by 30 mg by mouth, and status post treatment heart rate noted to be in the high 90s to low 100s.  Imaging including CT of head and C-spine were negative for acute abnormality. Chest x-ray and x-rays of pelvis showed no abnormality.  Review of patient's labs remarkable for leukocytosis, and evidence of urinary tract infection. Patient given ceftriaxone, and as patient with need of more monitoring, treatment of UTI, and patient not able to be taken care of at home hospitalist consulted for admission.  Patient admitted without acute event.        Johnney Ou, MD 03/07/12 2221

## 2012-03-07 NOTE — H&P (Signed)
[facility] History and Physical  Jill Mckinney ZOX:[id number] DOB: [birth year]    PCP:   Illene Regulus, MD   Chief Complaint: generalized weakness.  HPI: Jill Mckinney is an 78 y.o. female with hx of advanced dementia, aflutter/afib with RVR, s/p ablation over 20 years ago at [facility], not on anticoagulation, Hx of ciliac disease, blindess, HTN, hearing loss, schizophrenia, discharged to home from rehab about 3 days ago, brought back to family because she has been more weak.  She was found in the ER to be in afib with RVR at 140.  Further evaluation showed UA positive for UTI, along with EKG in afib without any acute ischemic changes.  She was originally started on cardiazem drip, but was discontinued as her rate was controlled.  She was given cardiazem po and Rocephin, and hospitalist was asked to admit her for afib with RVR and UTI.  When I saw her, she went back to afib with HR 140.  Because of the back and neck pain, CT scans were done and showed no acute Fx.  Rewiew of Systems: I am unable to obtain any meaningful ROS.    Past Medical History  Diagnosis Date  . CATARACT EXTRACTION, HX OF 06/02/2007  . Celiac disease 06/02/2007  . CHEST WALL PAIN, ANTERIOR 11/24/2009  . Constipation 04/22/2011  . DEMENTIA, HX OF 06/02/2007  . FREQUENCY, URINARY 11/26/2008  . HEARING LOSS 06/02/2007  . Hypertension 04/24/2011  . INSOMNIA, CHRONIC 08/21/2010  . OSTEOPOROSIS 06/02/2007  . Other acquired absence of organ 06/02/2007  . ROTATOR CUFF REPAIR, HX OF 06/02/2007  . STASIS ULCER 06/02/2007  . TACHYARRHYTHMIA 06/02/2007  . Unspecified psychosis 06/02/2007  . UTI (lower urinary tract infection) 04/22/2011  . VITAMIN B12 DEFICIENCY 06/02/2007    Past Surgical History  Procedure Date  . Cardiac electrophysiology mapping and ablation   . Hand surgery   . Rotator cuff repair   . Excision large mole removed from back & breast   . Cholecystectomy   . Tonsillectomy   . Orif ankle  dislocation   . Cataract extraction     Medications:  HOME MEDS: Prior to Admission medications   Medication Sig Start Date End Date Taking? Authorizing Provider  amLODipine (NORVASC) 5 MG tablet Take 5 mg by mouth daily.   Yes Historical Provider, MD  LORazepam (ATIVAN) 0.5 MG tablet Take 0.5 mg by mouth every 6 (six) hours as needed. anxiety   Yes Historical Provider, MD  mirtazapine (REMERON) 15 MG tablet Take 15 mg by mouth at bedtime.   Yes Historical Provider, MD  phenazopyridine (PYRIDIUM) 200 MG tablet Take 200 mg by mouth 3 (three) times daily as needed. pain   Yes Historical Provider, MD  traMADol (ULTRAM-ER) 100 MG 24 hr tablet Take 50 mg by mouth 3 (three) times daily as needed.   Yes Historical Provider, MD  acetaminophen (TYLENOL) 325 MG tablet Take 2 tablets (650 mg total) by mouth every 6 (six) hours as needed (or Fever >/= 101). 02/06/12 02/05/13  Jacques Navy, MD  aspirin EC 81 MG tablet Take 81 mg by mouth daily.      Historical Provider, MD  B Complex-C (SUPER B COMPLEX PO) Take 1 tablet by mouth daily.      Historical Provider, MD  Calcium Citrate-Vitamin D (CALCIUM CITRATE + PO) Take 2 tablets by mouth 2 (two) times daily.      Historical Provider, MD  diltiazem (CARDIZEM) 60 MG tablet Take 1 tablet (60 mg  total) by mouth every 8 (eight) hours. 02/12/12 02/11/13  Jacques Navy, MD  divalproex (DEPAKOTE SPRINKLE) 125 MG capsule Take 125 mg by mouth 2 (two) times daily.    Historical Provider, MD  docusate sodium (COLACE) 100 MG capsule Take 100 mg by mouth 2 (two) times daily.      Historical Provider, MD  feeding supplement (ENSURE) PUDG Take 1 Container by mouth 3 (three) times daily between meals. 02/12/12   Jacques Navy, MD  ferrous sulfate 325 (65 FE) MG tablet Take 325 mg by mouth daily with breakfast.    Historical Provider, MD  folic acid (FOLVITE) 400 MCG tablet Take 400 mcg by mouth daily.      Historical Provider, MD  furosemide (LASIX) 20 MG tablet Take  1 tablet (20 mg total) by mouth daily. 02/12/12 02/11/13  Jacques Navy, MD  OLANZapine (ZYPREXA) 2.5 MG tablet Take 1 tablet (2.5 mg total) by mouth 2 (two) times daily. 02/06/12 03/07/12  Jacques Navy, MD     Allergies:  Allergies  Allergen Reactions  . Codeine Nausea And Vomiting  . Penicillins Rash  . Sulfa Antibiotics Rash    Social History:   reports that she has never smoked. She has never used smokeless tobacco. She reports that she does not drink alcohol. Her drug history not on file.  Family History: Family History  Problem Relation Age of Onset  . Stroke Mother   . Lung cancer Father   . Breast cancer Maternal Aunt      Physical Exam: Filed Vitals:   03/07/12 2115 03/07/12 2145 03/07/12 2200 03/07/12 2230  BP: 103/58 94/26 96/55  93/65  Pulse: 116 111 110 126  Temp:      TempSrc:      Resp: 18 30 20 21   SpO2: 96% 97% 95% 95%   Blood pressure 93/65, pulse 126, temperature 98.1 F (36.7 C), temperature source Oral, resp. rate 21, SpO2 95.00%.  GEN: she opens her eyes and was able to say 'don't do that" HEENT: Mucous membranes pink and anicteric; PERRLA; EOM intact; no cervical lymphadenopathy nor thyromegaly or carotid bruit; no JVD; There were no stridor. Neck is very supple. Breasts:: Not examined CHEST WALL: No tenderness CHEST: Normal respiration, clear to auscultation bilaterally.  HEART:  Irregular rate and rhythm.  Tachy, and no murmur.  BACK: No kyphosis or scoliosis; no CVA tenderness ABDOMEN: soft and non-tender; no masses, no organomegaly, normal abdominal bowel sounds; no pannus; no intertriginous candida. There is no rebound and no distention. Rectal Exam: Not done EXTREMITIES: No bone or joint deformity; age-appropriate arthropathy of the hands and knees; no edema; no ulcerations.  There is no calf tenderness. Genitalia: not examined PULSES: 2+ and symmetric SKIN: Normal hydration no rash or ulceration CNS:her speech is fluent.  She moves  her lower extremities only slightly.  She has facial symmetry with grimace.  Labs on Admission:  Basic Metabolic Panel:  Lab 03/07/12 1914  NA 139  K 4.2  CL 99  CO2 26  GLUCOSE 141*  BUN 19  CREATININE 0.54  CALCIUM 9.4  MG --  PHOS --   Liver Function Tests:  Lab 03/07/12 1736  AST 18  ALT 11  ALKPHOS 103  BILITOT 0.2*  PROT 7.0  ALBUMIN 3.3*   No results found for this basename: LIPASE:5,AMYLASE:5 in the last 168 hours  Lab 03/07/12 2144  AMMONIA <10*   CBC:  Lab 03/07/12 1736  WBC 10.8*  NEUTROABS 8.1*  HGB 13.4  HCT 39.7  MCV 93.4  PLT 446*   Cardiac Enzymes:  Lab 03/07/12 1736  CKTOTAL --  CKMB --  CKMBINDEX --  TROPONINI <0.30    CBG: No results found for this basename: GLUCAP:5 in the last 168 hours   Radiological Exams on Admission: Dg Chest 2 View  03/07/2012  *RADIOLOGY REPORT*  Clinical Data: History of elevated heart rate.  History of back pain.  CHEST - 2 VIEW  Comparison: 02/09/2012.  01/17/2012.  Findings: There is a limited respiratory result.  There is minimal cardiac silhouette enlargement. Ectasia and nonaneurysmal calcification of the thoracic aorta are seen.  There is elevation of the right hemidiaphragm.  The infiltrative density in the right medial base may reflect atelectasis or fibrosis and is unchanged.  There is slight increase in reticular markings throughout the lungs. With vascular congestion pattern which is seen more prominently on lateral image.  There is prominence of the fissures which may reflect an element of subpleural edema or tiny amount of pleural effusion.  There is a vague area of nodularity in the left perihilar region which may reflect overlap of structures but when the patient is able suggest follow-up examination be obtained with deeper respiratory result. The appearance of the bones.  There is slight scoliosis convexity to the right.  Previous lumbar vertebral plasty procedure has been performed. There is a  compression fracture in the lower thoracic spine unchanged.  This is probably T11.  IMPRESSION: Limited respiratory result.  Minimal cardiac silhouette enlargement.  Elevated right hemidiaphragm with right medial basilar atelectasis and infiltrative density without significant change.  Slight increased reticular markings within the lungs with mild vascular congestion pattern.  There is prominence of the fissures on lateral may reflect subpleural edema or tiny amount of pleural fluid.   Original Report Authenticated By: Crawford Givens, M.D.    Dg Pelvis 1-2 Views  03/07/2012  *RADIOLOGY REPORT*  Clinical Data: History of hip pain.  PELVIS - 1-2 VIEW  Comparison: 02/01/2012.  Findings: There is degenerative spondylosis.  There is osteopenic appearance of bones.  No fracture or dislocation is identified. Pelvic phleboliths are present. Joint spaces are preserved. Fecal distention of portions of the colon.  IMPRESSION: No fracture or dislocation.  Osteopenic appearance of the bones. Degenerative spondylosis.   Original Report Authenticated By: Crawford Givens, M.D.    Ct Head Wo Contrast  03/07/2012  *RADIOLOGY REPORT*  Clinical Data: Fall, confusion  CT HEAD WITHOUT CONTRAST,CT CERVICAL SPINE WITHOUT CONTRAST  Technique:  Contiguous axial images were obtained from the base of the skull through the vertex without contrast.,Technique: Multidetector CT imaging of the cervical spine was performed. Multiplanar CT image reconstructions were also generated.  Comparison: 10/02/2007  Findings: No skull fracture is noted.  Paranasal sinuses and mastoid air cells are unremarkable.  No intracranial hemorrhage, mass effect or midline shift.  No acute infarction.  No mass lesion is noted on this unenhanced scan.  Stable periventricular and subcortical chronic white matter decreased attenuation consistent with white matter chronic ischemic changes.  Stable cerebral atrophy.  IMPRESSION: No acute intracranial abnormality.  Stable atrophy  and chronic white matter disease.  CT cervical spine without IV contrast:  Axial images of the cervical spine shows no acute fracture or subluxation.  Reversal of cervical lordosis again noted.  Stable about 4 mm anterior subluxation C3 on C4 vertebral body.  Again noted disc space flattening with anterior and mild posterior spurring at C4-C5 C5-C6 and C6-C7 level.  No prevertebral soft tissue swelling.  Large anterior osteophytes at C5-C6 level again noted.  Cervical airway is patent.  Impression: 1.  No acute fracture or subluxation.  Stable degenerative changes as described above.  Stable about 3.7 mm anterior subluxation C3 on C4 vertebral body.   Original Report Authenticated By: Natasha Mead, M.D.    Ct Cervical Spine Wo Contrast  03/07/2012  *RADIOLOGY REPORT*  Clinical Data: Fall, confusion  CT HEAD WITHOUT CONTRAST,CT CERVICAL SPINE WITHOUT CONTRAST  Technique:  Contiguous axial images were obtained from the base of the skull through the vertex without contrast.,Technique: Multidetector CT imaging of the cervical spine was performed. Multiplanar CT image reconstructions were also generated.  Comparison: 10/02/2007  Findings: No skull fracture is noted.  Paranasal sinuses and mastoid air cells are unremarkable.  No intracranial hemorrhage, mass effect or midline shift.  No acute infarction.  No mass lesion is noted on this unenhanced scan.  Stable periventricular and subcortical chronic white matter decreased attenuation consistent with white matter chronic ischemic changes.  Stable cerebral atrophy.  IMPRESSION: No acute intracranial abnormality.  Stable atrophy and chronic white matter disease.  CT cervical spine without IV contrast:  Axial images of the cervical spine shows no acute fracture or subluxation.  Reversal of cervical lordosis again noted.  Stable about 4 mm anterior subluxation C3 on C4 vertebral body.  Again noted disc space flattening with anterior and mild posterior spurring at C4-C5 C5-C6  and C6-C7 level.  No prevertebral soft tissue swelling.  Large anterior osteophytes at C5-C6 level again noted.  Cervical airway is patent.  Impression: 1.  No acute fracture or subluxation.  Stable degenerative changes as described above.  Stable about 3.7 mm anterior subluxation C3 on C4 vertebral body.   Original Report Authenticated By: Natasha Mead, M.D.     EKG: Independently reviewed  Showed afib with RVR and no ischemic changes.   Assessment/Plan Present on Admission:  .TACHYARRHYTHMIA .Paroxysmal a-fib .Celiac disease .UTI (lower urinary tract infection) .FTT (failure to thrive) in adult .Schizophrenia   PLAN:  Afib with RVR.  I will continue with her IV cardiazem drip.  Can D/C in am and readjust her PO meds.  She does have advanced dementia and it is difficult to obtain any ROS from her.  She does have a UTI, and will be tx with Rocephin.  For her Dapakote use, the level is slightly subtherapeutic and the NH3 level is also negative.  Will continue with it.  She is a DNR and will be admitted to Dr Izora Ribas service as per prior arrangements.  Other plans as per orders.  Code Status: DNR/DNI.   Houston Siren, MD. [facility] Pager [phone] 7pm to 7am.  03/07/2012, 11:30 PM

## 2012-03-07 NOTE — ED Provider Notes (Signed)
I have supervised the resident on the management of this patient and agree with the note above. I personally interviewed and examined the patient and my addendum is below.   Jill Mckinney is a 78 y.o. female hx of dementia, arthritis, UTI here with hip and back pain. She was recently discharged from rehab but now has severe back and hip pain. She has paraspinal tenderness throughout and is unable to walk. Her labs showed WBC 11, BNP 1100. UA + UTI and she was given ceftriaxone. CT head/neck showed no fractures. CXR, pelvis xray showed osteopenia but no fractures. She is admitted for UTI and unable to care for herself at home.    Richardean Canal, MD 03/07/12 [phone]

## 2012-03-07 NOTE — ED Notes (Addendum)
Per husband pt c/o back or hip pain x 3 days; pt total care but per pt has had increased difficulty getting out of bed; pt moaning in pain at present; pt demented; family denies any falls or obvious injury

## 2012-03-07 NOTE — ED Notes (Signed)
Patient currently asleep in bed; no respiratory or acute distress. Currently waiting on Cardizem to be delivered from pharmacy.  Calling report now.  Will continue to monitor.

## 2012-03-07 NOTE — ED Notes (Signed)
Pt currently asleep. Pt does not appear to be in any acute distress. Will continue to monitor pt.

## 2012-03-07 NOTE — ED Notes (Signed)
Pt currently asleep. Pt appears to be in no acute distress. Will continue to monitor pt.

## 2012-03-07 NOTE — ED Notes (Signed)
Report received by Alvino Chapel, RN. Pt appears to be in no acute distress. Pt asleep.

## 2012-03-07 NOTE — ED Notes (Signed)
Report given to floor to [place], Charity fundraiser. No further questions asked by RN. Preparing pt to go to floor.

## 2012-03-07 NOTE — ED Notes (Signed)
Cardizem stopped per MD order.  PO Cardizem given with applesauce.

## 2012-03-08 DIAGNOSIS — I10 Essential (primary) hypertension: Secondary | ICD-10-CM

## 2012-03-08 DIAGNOSIS — K5909 Other constipation: Secondary | ICD-10-CM

## 2012-03-08 DIAGNOSIS — F0390 Unspecified dementia without behavioral disturbance: Secondary | ICD-10-CM

## 2012-03-08 LAB — CREATININE, SERUM
Creatinine, Ser: 0.47 mg/dL — ABNORMAL LOW (ref 0.50–1.10)
GFR calc Af Amer: >90 mL/min (ref >90)
GFR calc non Af Amer: 88 mL/min — ABNORMAL LOW (ref >90)

## 2012-03-08 LAB — TSH: TSH: 7.042 u[IU]/mL — ABNORMAL HIGH (ref 0.350–4.500)

## 2012-03-08 LAB — CBC
HCT: 35.5 % — ABNORMAL LOW (ref 36.0–46.0)
Hemoglobin: 11.6 g/dL — ABNORMAL LOW (ref 12.0–15.0)
MCH: 31.3 pg (ref 26.0–34.0)
MCHC: 33.5 g/dL (ref 30.0–36.0)
MCHC: 33.5 g/dL (ref 30.0–36.0)
Platelets: 326 10*3/uL (ref 150–400)
RDW: 14.2 % (ref 11.5–15.5)
RDW: 14.2 % (ref 11.5–15.5)
WBC: 7.6 10*3/uL (ref 4.0–10.5)

## 2012-03-08 MED ORDER — SODIUM CHLORIDE 0.9 % IV SOLN: 250.0000 mL | INTRAVENOUS | Status: DC | PRN

## 2012-03-08 MED ORDER — SODIUM CHLORIDE 0.9 % IJ SOLN: 3.0000 mL | INTRAMUSCULAR | Status: DC | PRN

## 2012-03-08 MED ORDER — ONDANSETRON HCL 4 MG/2ML IJ SOLN: 4.0000 mg | Freq: Four times a day (QID) | INTRAMUSCULAR | Status: DC | PRN

## 2012-03-08 MED ORDER — LORAZEPAM 0.5 MG PO TABS: 0.5000 mg | ORAL_TABLET | Freq: Four times a day (QID) | ORAL | Status: DC | PRN

## 2012-03-08 MED ORDER — SODIUM CHLORIDE 0.9 % IJ SOLN: 3.0000 mL | Freq: Two times a day (BID) | INTRAMUSCULAR | Status: DC

## 2012-03-08 MED ORDER — DOCUSATE SODIUM 100 MG PO CAPS
100.0000 mg | ORAL_CAPSULE | Freq: Two times a day (BID) | ORAL | Status: DC
  Administered 2012-03-08: 100 mg via ORAL
  Filled 2012-03-08 (×4): qty 1

## 2012-03-08 MED ORDER — TRAMADOL 5 MG/ML ORAL SUSPENSION
50.0000 mg | Freq: Three times a day (TID) | ORAL | Status: DC
  Filled 2012-03-08 (×3): qty 10

## 2012-03-08 MED ORDER — BISACODYL 10 MG RE SUPP
10.0000 mg | Freq: Once | RECTAL | Status: AC
  Administered 2012-03-08: 10 mg via RECTAL
  Filled 2012-03-08: qty 1

## 2012-03-08 MED ORDER — OLANZAPINE 2.5 MG PO TABS
2.5000 mg | ORAL_TABLET | Freq: Two times a day (BID) | ORAL | Status: DC
  Administered 2012-03-08 – 2012-03-09 (×3): 2.5 mg via ORAL
  Filled 2012-03-08 (×5): qty 1

## 2012-03-08 MED ORDER — ENOXAPARIN SODIUM 40 MG/0.4ML ~~LOC~~ SOLN
40.0000 mg | SUBCUTANEOUS | Status: DC
  Administered 2012-03-08 – 2012-03-09 (×2): 40 mg via SUBCUTANEOUS
  Filled 2012-03-08 (×2): qty 0.4

## 2012-03-08 MED ORDER — DEXTROSE 5 % IV SOLN
1.0000 g | Freq: Every day | INTRAVENOUS | Status: DC
  Filled 2012-03-08: qty 10

## 2012-03-08 MED ORDER — ASPIRIN EC 81 MG PO TBEC
81.0000 mg | DELAYED_RELEASE_TABLET | Freq: Every day | ORAL | Status: DC
  Administered 2012-03-08 – 2012-03-09 (×2): 81 mg via ORAL
  Filled 2012-03-08 (×2): qty 1

## 2012-03-08 MED ORDER — SODIUM CHLORIDE 0.9 % IJ SOLN
3.0000 mL | Freq: Two times a day (BID) | INTRAMUSCULAR | Status: DC
  Administered 2012-03-08 – 2012-03-09 (×3): 3 mL via INTRAVENOUS

## 2012-03-08 MED ORDER — FOLIC ACID 1 MG PO TABS
1.0000 mg | ORAL_TABLET | Freq: Every day | ORAL | Status: DC
  Administered 2012-03-08 – 2012-03-09 (×2): 1 mg via ORAL
  Filled 2012-03-08 (×2): qty 1

## 2012-03-08 MED ORDER — ONDANSETRON HCL 4 MG PO TABS: 4.0000 mg | ORAL_TABLET | Freq: Four times a day (QID) | ORAL | Status: DC | PRN

## 2012-03-08 MED ORDER — ACETAMINOPHEN 325 MG PO TABS: 650.0000 mg | ORAL_TABLET | Freq: Four times a day (QID) | ORAL | Status: DC | PRN

## 2012-03-08 MED ORDER — FOLIC ACID 400 MCG PO TABS: 400.0000 ug | ORAL_TABLET | Freq: Every day | ORAL | Status: DC

## 2012-03-08 MED ORDER — FERROUS SULFATE 325 (65 FE) MG PO TABS
325.0000 mg | ORAL_TABLET | Freq: Every day | ORAL | Status: DC
  Administered 2012-03-08 – 2012-03-09 (×2): 325 mg via ORAL
  Filled 2012-03-08 (×3): qty 1

## 2012-03-08 MED ORDER — ENSURE PUDDING PO PUDG
1.0000 | Freq: Three times a day (TID) | ORAL | Status: DC
  Administered 2012-03-08 – 2012-03-09 (×4): 1 via ORAL

## 2012-03-08 MED ORDER — DIVALPROEX SODIUM 125 MG PO CPSP
125.0000 mg | ORAL_CAPSULE | Freq: Two times a day (BID) | ORAL | Status: DC
  Administered 2012-03-08 – 2012-03-09 (×3): 125 mg via ORAL
  Filled 2012-03-08 (×5): qty 1

## 2012-03-08 MED ORDER — DILTIAZEM HCL 60 MG PO TABS
60.0000 mg | ORAL_TABLET | Freq: Three times a day (TID) | ORAL | Status: DC
  Administered 2012-03-08 – 2012-03-09 (×3): 60 mg via ORAL
  Filled 2012-03-08 (×7): qty 1

## 2012-03-08 MED ORDER — TRAMADOL HCL 50 MG PO TABS
50.0000 mg | ORAL_TABLET | Freq: Three times a day (TID) | ORAL | Status: DC
  Administered 2012-03-08 – 2012-03-09 (×2): 50 mg via ORAL
  Filled 2012-03-08 (×2): qty 1

## 2012-03-08 MED ORDER — DOCUSATE SODIUM 100 MG PO CAPS: 100.0000 mg | ORAL_CAPSULE | Freq: Two times a day (BID) | ORAL | Status: DC

## 2012-03-08 MED ORDER — MAGNESIUM HYDROXIDE 400 MG/5ML PO SUSP
15.0000 mL | Freq: Two times a day (BID) | ORAL | Status: DC
  Administered 2012-03-08: 15 mL via ORAL
  Administered 2012-03-08: 30 mL via ORAL
  Filled 2012-03-08 (×4): qty 30

## 2012-03-08 MED ORDER — CIPROFLOXACIN HCL 250 MG PO TABS
250.0000 mg | ORAL_TABLET | Freq: Two times a day (BID) | ORAL | Status: DC
  Administered 2012-03-08 – 2012-03-09 (×3): 250 mg via ORAL
  Filled 2012-03-08 (×5): qty 1

## 2012-03-08 NOTE — Plan of Care (Signed)
Problem: Phase III Progression Outcomes Goal: Activity at appropriate level-compared to baseline (UP IN CHAIR FOR HEMODIALYSIS)  Outcome: Progressing Stand by assist of 2 with ambulation and a walker. Goal: Voiding independently Outcome: Not Progressing Pt is incontinent of bowel and urine. Does not remember to call for help to use the restroom. Goal: Discharge plan remains appropriate-arrangements made Outcome: Not Progressing Pt will need a SNF.

## 2012-03-08 NOTE — Progress Notes (Signed)
Pt unable to answer admission history questions due to Hx of dementia.

## 2012-03-08 NOTE — Progress Notes (Signed)
Subjective: Pt admitted with A fib with AVR and UTI. She was recently hospitalized for similar problems, went to SNF and was discharged on Sept 17th. At home she became weaker and was brought to Ed where she was found to be tachycardic. She was started on a cardiazem drip for rate control. She has been started on antibiotics for UTI. Currently in no distress  Spoke with Mr. Kandler. She has been home for a few days but stayed in the bed due to back pain. They have been able to get her up to a chair. She has not had a BM since being home  Objective: Lab: Lab Results  Component Value Date   WBC 7.3 03/08/2012   HGB 11.6* 03/08/2012   HCT 34.6* 03/08/2012   MCV 93.3 03/08/2012   PLT 326 03/08/2012   BMET    Component Value Date/Time   NA 139 03/07/2012 1736   K 4.2 03/07/2012 1736   CL 99 03/07/2012 1736   CO2 26 03/07/2012 1736   GLUCOSE 141* 03/07/2012 1736   BUN 19 03/07/2012 1736   CREATININE 0.47* 03/08/2012 0112   CALCIUM 9.4 03/07/2012 1736   GFRNONAA 88* 03/08/2012 0112   GFRAA >90 03/08/2012 0112     Imaging:  Scheduled Meds:   . aspirin EC  81 mg Oral Daily  . cefTRIAXone (ROCEPHIN)  IV  1 g Intravenous Once  . cefTRIAXone (ROCEPHIN)  IV  1 g Intravenous QHS  . diltiazem  30 mg Oral Once  . diltiazem  60 mg Oral Q8H  . divalproex  125 mg Oral BID  . docusate sodium  100 mg Oral BID  . enoxaparin (LOVENOX) injection  40 mg Subcutaneous Q24H  . feeding supplement  1 Container Oral TID BM  . ferrous sulfate  325 mg Oral Q breakfast  . folic acid  1 mg Oral Daily  .  morphine injection  4 mg Intravenous Once  . OLANZapine  2.5 mg Oral BID  . sodium chloride  3 mL Intravenous Q12H  . sodium chloride  3 mL Intravenous Q12H  . DISCONTD: cefTRIAXone (ROCEPHIN) IM  1 g Intramuscular Once  . DISCONTD: docusate sodium  100 mg Oral BID  . DISCONTD: folic acid  400 mcg Oral Daily   Continuous Infusions:   . diltiazem (CARDIZEM) infusion 5 mg/hr (03/07/12 2233)  . DISCONTD:  diltiazem (CARDIZEM) infusion Stopped (03/07/12 1807)   PRN Meds:.sodium chloride, acetaminophen, LORazepam, ondansetron (ZOFRAN) IV, ondansetron, sodium chloride   Physical Exam: Filed Vitals:   03/08/12 0552  BP: 112/89  Pulse: 89  Temp: 98.5 F (36.9 C)  Resp: 18  No intake or output data in the 24 hours ending 03/08/12 1032  Tele: a fib with occasional RVR  Gen'l - elderly white woman in no acute distress but blind and deaf. Cor- IRIR rate controlled on exam Pulm - Good breath sounds, no rales or wheezing Abd- BS+ , mild guarding Neuro - awake, vocalizes, MAE    Assessment/Plan: 1. Cardiac - h/o of a fib and ablation 20 years ago. At last adm she went back into A fib and was brought under control with oral medication. She is not a candidate for repeat ablation.  Plan Restart cardiazem 60 mg tid  D/c IV drip 1 hour after oral dose.  2. UTI - very mildly positive U/A, no leukocytosis  Plan Oral antibiotics  3. GI - Per SO she has been obstiapted.  Plan Dulcolax suppository  MOM  4. Dementia -  she has been doing better with atypical antipsychotics and will continue this.  5. Back pain - husband reports this has been a real problem for several days  Plan PT eval  Tramadol q 8 on schedule   [employer] [facility] (o) [phone]; (c) [phone] Call-grp - P[facility] IM Tele: [phone]  03/08/2012, 10:30 AM

## 2012-03-09 DIAGNOSIS — I4891 Unspecified atrial fibrillation: Principal | ICD-10-CM

## 2012-03-09 DIAGNOSIS — R627 Adult failure to thrive: Secondary | ICD-10-CM

## 2012-03-09 DIAGNOSIS — N390 Urinary tract infection, site not specified: Secondary | ICD-10-CM

## 2012-03-09 LAB — URINE CULTURE

## 2012-03-09 MED ORDER — TRAMADOL HCL 50 MG PO TABS: 50.0000 mg | ORAL_TABLET | Freq: Three times a day (TID) | ORAL | Status: DC

## 2012-03-09 MED ORDER — CIPROFLOXACIN HCL 250 MG PO TABS: 250.0000 mg | ORAL_TABLET | Freq: Every day | ORAL | Status: DC

## 2012-03-09 NOTE — Progress Notes (Signed)
Patient is cardiac stable with no evidence of significant infection. PT recommendations reveiwed. Spoke with husband. He believes that he and his daughter can continue to manage her at home.   For d/c home. Home health ordered.  Dictated # [phone]

## 2012-03-09 NOTE — Progress Notes (Signed)
Discharged to home with family office visits in place teaching done  

## 2012-03-09 NOTE — Discharge Summary (Signed)
Jill Mckinney, Jill Mckinney NO.:  [id number]  MEDICAL RECORD NO.:  [id number]  LOCATION:  2030                         FACILITY:  [facility]  PHYSICIAN:  Rosalyn Gess. Carlton Buskey, MD  DATE OF BIRTH:  08-May-1924  DATE OF ADMISSION:  03/07/2012 DATE OF DISCHARGE:  03/09/2012                              DISCHARGE SUMMARY   ADMITTING DIAGNOSES: 1. Atrial fibrillation with rapid ventricular response. 2. Recurrent urinary tract infection. 3. Failure to thrive.  DISCHARGE DIAGNOSES: 1. Atrial fibrillation with rapid ventricular response. 2.  Recurrent     urinary tract infection. 2. Failure to thrive.  CONSULTANTS:  None.  PROCEDURES:  Chest x-ray on the day of admission which showed limited respiratory results, minimal cardiac silhouette enlargement.  Elevated right hemidiaphragm with right medial basilar atelectasis and question of infiltrated disease without significant change from previous studies. Slight increased reticular markings within the lungs with mild vascular congestion pattern.  X-ray of pelvis 2 views with no fracture or dislocation.  Osteopenic appearance of the bones.  Degenerative spondylosis. CT of the head without contrast, which showed no acute intracranial abnormality, stable atrophy and chronic white matter disease. CT of the cervical spine, which showed no acute fracture or subluxation. Stable degenerative changes.  Stable 3.7 mm anterior subluxation of C3 on C4 vertebral body.  HISTORY OF PRESENT ILLNESS:  Ms. Furney is an 78 year old Caucasian woman with advanced dementia, blindness and deafness who was recently admitted with recurrent atrial fib/flutter with RVR.  The patient did have elevation 20 years ago at [facility], but recently had recurrent atrial fib.  Given her advanced age and comorbidities, she was not considered a candidate for repeat ablation procedures.  She was started on oral Cardizem at her last admission.  The patient  was in a rehabilitation/skilled care facility until September 17 when she was discharged home supposedly in good condition.  Family brought her to the emergency department on the 20th because of increasing weakness.  She was found to have persistent AFib with an RVR with rates as high as 140. UA was positive for UTI.  The patient having given a Cardizem drip. This was discontinued as her rate was controlled.  She was started on PO Cardizem.  She was given a dose of Rocephin and she was admitted to the hospital for further management.  Please see the H and P for past medical history, family history, social history and exam.  Please see recent hospital admission and discharge summaries.  HOSPITAL COURSE: 1. Cardiac.  The patient with history of atrial fibrillation with     rapid ventricular response.  Her rate at time of evaluation in the     emergency department was elevated, but not extremely high or posing     a risk of syncope.  She did respond well to IV Cardizem and was     restarted on p.o. Cardizem.  Telemetry over the last 24 hours is     revealed stable atrial fibrillation with rate averaging around 100,     although she will have excursions to as high as 130.  She has been     hemodynamically stable. The patient will continue on  p.o. Cardizem and is stable for discharge to home.  1. Urinary tract infection.  Urinalysis at time of admission showed     the urine to be clear, specific gravity of 1.029, pH of 6, moderate     leukocyte esterase on dip stick.  She has 7-10 wbc's per high-power     field, 0-2 rbcs and she only had a few bacteria.  The patient had     been given Rocephin.  At this point without an elevated white blood     count without fever she will be maintained on chronic suppressive     therapy with trimethoprim and sulfamethoxazole once a day.  1. Gastrointestinal.  The patient has been obstipated.  She was given     a Dulcolax suppository as well as milk of  magnesia.  At the time of     this dictation, the patient has not had a recorded bowel movement.     Abdomen was soft.  1. Dementia.  The patient is stable.  She will be continued on     Zyprexa.  1. Back pain.  The patient's husband had reported she had significant     back pain for several days.  Physical Therapy did evaluate the     patient.  They did see that she had the need for a significant     assistance.  The patient was started on tramadol 50 mg q.8 on     schedule and seems to have responded well to that in regards to her     discomfort.  1. Disposition.  The patient was evaluated by PT and nursing who felt     that the patient would benefit from skilled care.  The patient just     had a course of skilled care.  The patient's husband was contacted     and he is adamant that they can manage her at home.  In addition,     they do not have [employer] cover days remaining.  The patient's     daughter lives with her at home in between the husband and     daughter.  They cannot manage her.  Plan will be to discharge to     home with the assistance of home health, both nursing to monitor     her vital signs and response to pain medication, physical therapy,     to maximize in-home mobility and aide services to assist with some     of her basic needs for bathing and assistance in that regard.  DISCHARGE PHYSICAL EXAMINATION:  VITAL SIGNS:  Temperature was 97.5, blood pressure 106/68, pulse was 110, respirations were 18, O2 sats 94% on room air.  Telemetry was reviewed with the monitor clerk and the record shows the patient has had been in atrial fibrillation with the majority of her rate being in the 100 range with excursions to 120, 130 as noted. GENERAL APPEARANCE:  This is a very elderly woman looking older than her stated chronologic age.  She is in no acute distress.  HEENT:  The patient has temporal wasting.  She has dentures with dentures in place. Conjunctivae and sclerae  were clear. NECK:  Supple.  No thyromegaly was appreciated. PULMONARY:  The patient has normal respirations with no increased work of breathing.  She has no rales or wheezes on exam. CARDIOVASCULAR:  2+ radial pulses.  Precordium was quiet with distant heart sounds.  She has an irregularly irregular rate.  ABDOMEN: Scaphoid.  She had positive bowel sounds.  No guarding or rebound was noted. GENITALIA:  The patient has a Foley catheter in place which will be discontinued at discharge. EXTREMITIES:  Without clubbing, cyanosis, or edema. NEURO:  The patient is minimally alert.  Does not follow commands.  It is hard to know if there is a change from baseline since she is very hard of hearing.  FINAL LABORATORY:  Final CBC from September 21 with a white count of 7300, hemoglobin 11.6 g, platelet count 326,000.  Final chemistries from September 20 with a sodium of 139, potassium 4.2, chloride at 99, CO2 of 26, BUN 19 creatinine 0.54, glucose was 141.  Liver functions were normal.  Ammonia was less than 10.  The patient did have a followup creatinine March 08, 2012, which was 0.47.  TSH was minimally elevated at 7.042.  DISPOSITION:  The patient will return home.  She will continue on her most recent medications and this will include aspirin 81 mg daily.  We will start her on trimethoprim and sulfamethoxazole regular strength 1 daily for her urinary tract suppression, diltiazem 60 mg 3 times daily, Depakote 125 mg orally daily, Ensure 3 times a day.  We will discontinue iron due to constipation, folic acid 1 mg daily, Zyprexa 0.25 mg b.i.d., tramadol 50 mg every 8 hours. The patient will be returned to home.  Home health as above.  Given the patient's advanced age, multiple comorbidities, her blindness, her deafness and her frailty, we will see the patient at home in 7-10 days for followup.  The patient's condition at time of discharge dictation is poor, guarded due to her multiple  comorbidities.     Rosalyn Gess Corynn Solberg, MD     MEN/MEDQ  D:  03/09/2012  T:  03/09/2012  Job:  [id number]

## 2012-03-09 NOTE — Evaluation (Signed)
Physical Therapy Evaluation Patient Details Name: Jill Mckinney MRN: [id number] DOB: [birth year] Today's Date: 03/09/2012 Time: 0810-0829 PT Time Calculation (min): 19 min  PT Assessment / Plan / Recommendation Clinical Impression  Patient is an 78 yo female admitted with PAF and UTI.  Patient with history of advanced dementia and blindness.  Patient not following commands today during therapy.  Requiring total assist for mobility.  Patient anxious and agitated during session.  Recommend SNF at discharge.  Encouraged nursing to use lift equipment for safety.  Will attempt mobility training for potential improvement.    PT Assessment  Patient needs continued PT services    Follow Up Recommendations  Skilled nursing facility    Barriers to Discharge        Equipment Recommendations  Defer to next venue    Recommendations for Other Services     Frequency Min 2X/week    Precautions / Restrictions Precautions Precautions: Fall Restrictions Weight Bearing Restrictions: No         Mobility  Bed Mobility Bed Mobility: Rolling Left;Left Sidelying to Sit;Sitting - Scoot to [place] of Bed;Sit to Supine Rolling Left: 1: +1 Total assist Left Sidelying to Sit: 1: +1 Total assist Sitting - Scoot to Edge of Bed: 1: +1 Total assist Sit to Supine: 1: +2 Total assist Sit to Supine: Patient Percentage: 0% Details for Bed Mobility Assistance: Patient required assist to move LE's off bed - did not follow commands to move legs to edge of bed.  Patient extending backward against PT when moving to sitting position.  Patient unaable to assist with moving back to supine. Transfers Transfers: Sit to Stand Sit to Stand: 1: +2 Total assist Sit to Stand: Patient Percentage: 20% Details for Transfer Assistance: Attempted standing - patient extending backward and yelling out.  Unable to get patient to standing position.    Exercises     PT Diagnosis: Difficulty walking;Generalized weakness;Altered  mental status  PT Problem List: Decreased strength;Decreased activity tolerance;Decreased balance;Decreased mobility;Decreased cognition;Decreased knowledge of use of DME;Decreased safety awareness;Cardiopulmonary status limiting activity PT Treatment Interventions: DME instruction;Gait training;Functional mobility training;Balance training;Patient/family education   PT Goals Acute Rehab PT Goals PT Goal Formulation: Patient unable to participate in goal setting Time For Goal Achievement: 03/16/12 Potential to Achieve Goals: Fair Pt will Roll Supine to Right Side: with min assist PT Goal: Rolling Supine to Right Side - Progress: Goal set today Pt will Roll Supine to Left Side: with min assist PT Goal: Rolling Supine to Left Side - Progress: Goal set today Pt will go Supine/Side to Sit: with min assist;with HOB 0 degrees PT Goal: Supine/Side to Sit - Progress: Goal set today Pt will go Sit to Supine/Side: with min assist;with HOB 0 degrees PT Goal: Sit to Supine/Side - Progress: Goal set today Pt will go Sit to Stand: with mod assist PT Goal: Sit to Stand - Progress: Goal set today Pt will go Stand to Sit: with mod assist PT Goal: Stand to Sit - Progress: Goal set today Pt will Transfer Bed to Chair/Chair to Bed: with mod assist PT Transfer Goal: Bed to Chair/Chair to Bed - Progress: Goal set today  Visit Information  Last PT Received On: 03/09/12 Assistance Needed: +2    Subjective Data  Subjective: "I want to lay down"  "Don't do that" Patient Stated Goal: Unable to state   Prior Functioning  Home Living Lives With: Other (Comment) (From SNF) Available Help at Discharge: Skilled Nursing Facility Prior Function Level of Independence:  Needs assistance Needs Assistance:  (With all mobility and ADL's) Able to Take Stairs?: No Driving: No Communication Communication: HOH (Blind)    Cognition  Overall Cognitive Status: No family/caregiver present to determine baseline  cognitive functioning Arousal/Alertness: Lethargic Orientation Level: Disoriented to;Place;Time;Situation Behavior During Session: Anxious Cognition - Other Comments: Does not follow commands to "raise arm" "wiggle toes" etc.    Extremity/Trunk Assessment Right Upper Extremity Assessment RUE ROM/Strength/Tone: WFL for tasks assessed Left Upper Extremity Assessment LUE ROM/Strength/Tone: WFL for tasks assessed Right Lower Extremity Assessment RLE ROM/Strength/Tone: Deficits RLE ROM/Strength/Tone Deficits: General weakness - difficult to assess due to decreased cognition Left Lower Extremity Assessment LLE ROM/Strength/Tone: Deficits LLE ROM/Strength/Tone Deficits: General weakness - difficult to assess due to decreased cognition   Balance Balance Balance Assessed: Yes Static Sitting Balance Static Sitting - Balance Support: No upper extremity supported;Feet supported Static Sitting - Level of Assistance: 5: Stand by assistance Static Sitting - Comment/# of Minutes: Once patient in sitting position, able to maintain sitting balance with standby assist.  Patient leaning backward slightly.  Verbal and tactile cues to lean forward.  Patient rubbing face with fingertips, and pushing her hair behind her ears while sitting.  When asked to raise arms, patient unable to follow commands.  Patient sat on EOB x 6 minutes.  End of Session PT - End of Session Equipment Utilized During Treatment: Gait belt Activity Tolerance: Patient limited by fatigue;Treatment limited secondary to agitation Patient left: in bed;with call bell/phone within reach Nurse Communication: Mobility status;Need for lift equipment  GP Functional Assessment Tool Used: Clinical judgement Functional Limitation: Mobility: Walking and moving around Mobility: Walking and Moving Around Current Status [phone]): At least 80 percent but less than 100 percent impaired, limited or restricted Mobility: Walking and Moving Around Goal Status  [phone]): At least 20 percent but less than 40 percent impaired, limited or restricted   Vena Austria 03/09/2012, 12:34 PM Durenda Hurt. Renaldo Fiddler, [facility] Acute Rehab Services Pager [phone]

## 2012-03-09 NOTE — Progress Notes (Signed)
   CARE MANAGEMENT NOTE 03/09/2012  Patient:  Jill Mckinney, Jill Mckinney   Account Number:  [id number]  Date Initiated:  03/09/2012  Documentation initiated by:  [facility]  Subjective/Objective Assessment:     Action/Plan:   lives at home with husband   Anticipated DC Date:  03/09/2012   Anticipated DC Plan:  HOME W HOME HEALTH SERVICES      DC Planning Services  CM consult      [facility] Choice  Resumption Of Svcs/PTA Provider   Choice offered to / List presented to:  C-4 Adult Children        HH arranged  HH-2 PT      [facility] agency  [facility]   Status of service:  Completed, signed off Medicare Important Message given?   (If response is "NO", the following Medicare IM given date fields will be blank) Date Medicare IM given:   Date Additional Medicare IM given:    Discharge Disposition:  HOME W HOME HEALTH SERVICES  Per UR Regulation:    If discussed at Long Length of Stay Meetings, dates discussed:    Comments:  03/09/2012 1800 Received call back from [employer], with [place]. They are following pt. NCM made aware pt was d/c home today and will fax resumption of care orders. Oncall RN verified fax for [employer] # [phone]. Isidoro Donning RN CCM Case Mgmt phone [phone]  03/09/2012 1730 Received message from dtr, Darl Pikes. States HH was with [facility]. Contacted [place] to verify. Waiting call back. Isidoro Donning RN CCM Case Mgmt phone [phone]  03/09/2012 1440 Spoke to pt and gave permission to speak to dtr, Darl Pikes. Dtr states she has HH already. Pt is not sure of agency. Will give NCM a call when she gets home. Isidoro Donning RN CCM Case Mgmt phone [phone]

## 2012-03-11 ENCOUNTER — Encounter: Payer: Self-pay | Admitting: Internal Medicine

## 2012-03-11 ENCOUNTER — Ambulatory Visit (INDEPENDENT_AMBULATORY_CARE_PROVIDER_SITE_OTHER): Payer: Medicare Other | Admitting: Internal Medicine

## 2012-03-11 ENCOUNTER — Ambulatory Visit: Payer: Medicare Other

## 2012-03-11 VITALS — BP 92/70 | HR 109 | Resp 14 | Wt 121.0 lb

## 2012-03-11 DIAGNOSIS — N390 Urinary tract infection, site not specified: Secondary | ICD-10-CM

## 2012-03-11 DIAGNOSIS — F0281 Dementia in other diseases classified elsewhere with behavioral disturbance: Secondary | ICD-10-CM

## 2012-03-11 DIAGNOSIS — F068 Other specified mental disorders due to known physiological condition: Secondary | ICD-10-CM

## 2012-03-11 DIAGNOSIS — G309 Alzheimer's disease, unspecified: Secondary | ICD-10-CM

## 2012-03-11 DIAGNOSIS — I4891 Unspecified atrial fibrillation: Secondary | ICD-10-CM

## 2012-03-11 DIAGNOSIS — I10 Essential (primary) hypertension: Secondary | ICD-10-CM

## 2012-03-11 DIAGNOSIS — Z23 Encounter for immunization: Secondary | ICD-10-CM

## 2012-03-11 DIAGNOSIS — I480 Paroxysmal atrial fibrillation: Secondary | ICD-10-CM

## 2012-03-11 NOTE — Patient Instructions (Addendum)
Atrial fibrillation - doing OK today with a HR 109.  Plan -   continue present medications  Constipation - try milk of mag 15-30 cc once a day every day. If this doesn't work you can buy Over the counter glycolax and give 17 g once a day.  Urinary track infection - did recover quickly. Complete prescription for acute infection and then continue 1 tablet trimethoprim/sulfamethoxazol daily.  If it is too hard to get her to the office I can make house calls.

## 2012-03-11 NOTE — Assessment & Plan Note (Signed)
Acute infection responding to therapy.  Plan - Complete acute dosing  Start suppressive therapy

## 2012-03-11 NOTE — Progress Notes (Signed)
Subjective:    Patient ID: Jill Mckinney, female    DOB: 1923-12-09, 78 y.o.   MRN: [id number]  HPI Jill Mckinney was d/c from [facility] Sunday, Sept 22. All records reviewed. She had been admitted for A fib w/ RVR. She also had constipation. She did respond to diltiazem and was sent home on 60 mg tid. She was also treated for UTI and was to start taking TMP/SMX once a day for suppression. Since being home she has had several BMs. Constipation remains a problem. HH has started. Her condition has remained stable over the interval since discharge and her husband and daughter are comfortable managing her at home.   PMH, FamHx and SocHx reviewed for any changes and relevance. Current Outpatient Prescriptions on File Prior to Visit  Medication Sig Dispense Refill  . acetaminophen (TYLENOL) 325 MG tablet Take 2 tablets (650 mg total) by mouth every 6 (six) hours as needed (or Fever >/= 101).  60 tablet  11  . aspirin EC 81 MG tablet Take 81 mg by mouth daily.        . B Complex-C (SUPER B COMPLEX PO) Take 1 tablet by mouth daily.        . Calcium Citrate-Vitamin D (CALCIUM CITRATE + PO) Take 2 tablets by mouth 2 (two) times daily.        . ciprofloxacin (CIPRO) 250 MG tablet Take 1 tablet (250 mg total) by mouth daily.  30 tablet  3  . diltiazem (CARDIZEM) 60 MG tablet Take 1 tablet (60 mg total) by mouth every 8 (eight) hours.  90 tablet  `11  . divalproex (DEPAKOTE SPRINKLE) 125 MG capsule Take 125 mg by mouth 2 (two) times daily.      Marland Kitchen docusate sodium (COLACE) 100 MG capsule Take 100 mg by mouth 2 (two) times daily.        . feeding supplement (ENSURE) PUDG Take 1 Container by mouth 3 (three) times daily between meals.      . folic acid (FOLVITE) 400 MCG tablet Take 400 mcg by mouth daily.        . furosemide (LASIX) 20 MG tablet Take 1 tablet (20 mg total) by mouth daily.  30 tablet    . LORazepam (ATIVAN) 0.5 MG tablet Take 0.5 mg by mouth every 6 (six) hours as needed. anxiety      .  Magnesium Hydroxide (MILK OF MAGNESIA PO) Take 5 mLs by mouth 3 (three) times daily with meals.      . mirtazapine (REMERON) 15 MG tablet Take 15 mg by mouth at bedtime.      Marland Kitchen OLANZapine (ZYPREXA) 2.5 MG tablet Take 2.5 mg by mouth 2 (two) times daily.      Marland Kitchen OVER THE COUNTER MEDICATION Place 1 suppository rectally daily as needed. For constipation      . traMADol (ULTRAM) 50 MG tablet Take 1 tablet (50 mg total) by mouth 3 (three) times daily.  90 tablet  5  . OLANZapine (ZYPREXA) 2.5 MG tablet Take 1 tablet (2.5 mg total) by mouth 2 (two) times daily.  60 tablet  11      Review of Systems System review is negative for any constitutional, cardiac, pulmonary, GI or neuro symptoms or complaints other than as described in the HPI.     Objective:   Physical Exam Filed Vitals:   03/11/12 1351  BP: 92/70  Pulse: 109  Resp: 14   Elderly white woman who is mildly agitated but  not in distress HEENT - [place]?AT, C&S clear Cor- IRIR but relatively rate controlled Pulm - CTAP Abd- soft, non-tender Neuro - demented and rambling.         Assessment & Plan:

## 2012-03-12 ENCOUNTER — Telehealth: Payer: Self-pay | Admitting: Internal Medicine

## 2012-03-12 NOTE — Telephone Encounter (Signed)
noted 

## 2012-03-12 NOTE — Initial Assessments (Cosign Needed)
This note is for documentation purposes as to the need for obtaining BNP level on Jill Mckinney.  Patient with h/o severe dementia and thus her ability to give adequate history is severely limited.  Patient noted to be in atrial fibrillation with RVR while in the ED.  Due to limited history, patient's PMH, and cardiac abnormality that could lead to significant heart failure, it was felt that BNP was an appropriate laboratory test.

## 2012-03-12 NOTE — Telephone Encounter (Signed)
Elvera Bicker with [facility] [phone] calling to make Dr.  Debby Bud aware patients heart rate was 118 today.  Please call Shanda Bumps back.  Thanks

## 2012-03-14 NOTE — Assessment & Plan Note (Signed)
BP Readings from Last 3 Encounters:  03/11/12 92/70  03/09/12 106/68  02/12/12 121/57   Adequate control.

## 2012-03-14 NOTE — Assessment & Plan Note (Signed)
H/o  A. Fib with history of ablation procedure at [facility]  Many years ago. Had recent recurrence with RVR. Has had two hospitalizations for this, including most recent adm with d/c 9/22. She remains in a. Fib but is not a candidate for anticoagulation due to fall and trauma risk. Rate is controlled at today's visit on diltiazmen 60 mg tid.   Plan - continue tid diltiazem.

## 2012-03-14 NOTE — Assessment & Plan Note (Signed)
Agitation, paranoia and delusional behavior exacerbated by sensory deprivation - blindness and deafness. She has been doing much better on atypical antipsychotic - zyprexa. No evidence of extrapyramidal symptoms  Plan - continue present medication.

## 2012-03-18 ENCOUNTER — Telehealth: Payer: Self-pay | Admitting: *Deleted

## 2012-03-18 NOTE — Telephone Encounter (Signed)
Home health nurse with [facility] request a rolling walker and hospital bed for home care for patient. Written request waiting Dr. Debby Bud signature to fax to [facility] [phone]

## 2012-03-20 ENCOUNTER — Encounter: Payer: Self-pay | Admitting: Cardiovascular Disease

## 2012-03-20 ENCOUNTER — Ambulatory Visit (INDEPENDENT_AMBULATORY_CARE_PROVIDER_SITE_OTHER): Payer: Medicare Other | Admitting: Cardiovascular Disease

## 2012-03-20 VITALS — BP 120/71 | HR 90 | Wt 121.0 lb

## 2012-03-20 DIAGNOSIS — I10 Essential (primary) hypertension: Secondary | ICD-10-CM

## 2012-03-20 DIAGNOSIS — I4891 Unspecified atrial fibrillation: Secondary | ICD-10-CM

## 2012-03-20 NOTE — Assessment & Plan Note (Signed)
Well controlled.  Continue current medications and low sodium Dash type diet.    

## 2012-03-20 NOTE — Progress Notes (Signed)
Patient ID: Jill Mckinney, female   DOB: 08/11/1923, 78 y.o.   MRN: [id number] 78 yo with severe dementia.  Chronic afib.  Not a coumadin candidate due to age, low weight and frailty.  ? Of prevoius afib ablation at [facility] but daughter indicates it was never done as she was in NSR at time.  She gets agitated and HR can go up with this but responds well to xanax.  She is legally blind.  Limited function.  No previous TIA or stroke  No history of CAD CHF or syncope.    ROS: Denies fever, malais, weight loss, blurry vision, decreased visual acuity, cough, sputum, SOB, hemoptysis, pleuritic pain, palpitaitons, heartburn, abdominal pain, melena, lower extremity edema, claudication, or rash.  All other systems reviewed and negative   General: Affect appropriate Healthy:  appears stated age HEENT: normal Neck supple with no adenopathy JVP normal no bruits no thyromegaly Lungs clear with no wheezing and good diaphragmatic motion Heart:  S1/S2 no murmur,rub, gallop or click PMI normal Abdomen: benighn, BS positve, no tenderness, no AAA no bruit.  No HSM or HJR Distal pulses intact with no bruits No edema Neuro non-focal Skin warm and dry No muscular weakness  Medications Current Outpatient Prescriptions  Medication Sig Dispense Refill  . acetaminophen (TYLENOL) 325 MG tablet Take 2 tablets (650 mg total) by mouth every 6 (six) hours as needed (or Fever >/= 101).  60 tablet  11  . aspirin EC 81 MG tablet Take 81 mg by mouth daily.        . B Complex-C (SUPER B COMPLEX PO) Take 1 tablet by mouth daily.        . Calcium Citrate-Vitamin D (CALCIUM CITRATE + PO) Take 2 tablets by mouth 2 (two) times daily.        . ciprofloxacin (CIPRO) 250 MG tablet Take 1 tablet (250 mg total) by mouth daily.  30 tablet  3  . diltiazem (CARDIZEM) 60 MG tablet Take 1 tablet (60 mg total) by mouth every 8 (eight) hours.  90 tablet  `11  . divalproex (DEPAKOTE SPRINKLE) 125 MG capsule Take 125 mg by mouth 2 (two)  times daily.      Marland Kitchen docusate sodium (COLACE) 100 MG capsule Take 100 mg by mouth 2 (two) times daily.        . folic acid (FOLVITE) 400 MCG tablet Take 400 mcg by mouth daily.        . furosemide (LASIX) 20 MG tablet Take 1 tablet (20 mg total) by mouth daily.  30 tablet    . LORazepam (ATIVAN) 0.5 MG tablet Take 0.5 mg by mouth every 6 (six) hours as needed. anxiety      . Magnesium Hydroxide (MILK OF MAGNESIA PO) Take 5 mLs by mouth 3 (three) times daily with meals.      . mirtazapine (REMERON) 15 MG tablet Take 15 mg by mouth at bedtime.      Marland Kitchen OLANZapine (ZYPREXA) 2.5 MG tablet Take 2.5 mg by mouth 2 (two) times daily.      Marland Kitchen OVER THE COUNTER MEDICATION Place 1 suppository rectally daily as needed. For constipation      . traMADol (ULTRAM) 50 MG tablet Take 1 tablet (50 mg total) by mouth 3 (three) times daily.  90 tablet  5  . DISCONTD: OLANZapine (ZYPREXA) 2.5 MG tablet Take 1 tablet (2.5 mg total) by mouth 2 (two) times daily.  60 tablet  11    Allergies Codeine; Penicillins;  and Sulfa antibiotics  Family History: Family History  Problem Relation Age of Onset  . Stroke Mother   . Lung cancer Father   . Breast cancer Maternal Aunt     Social History: History   Social History  . Marital Status: Married    Spouse Name: N/A    Number of Children: N/A  . Years of Education: N/A   Occupational History  . Not on file.   Social History Main Topics  . Smoking status: Never Smoker   . Smokeless tobacco: Never Used  . Alcohol Use: No  . Drug Use: Not on file  . Sexually Active: Not on file   Other Topics Concern  . Not on file   Social History Narrative   Married '483 daughters- '40, '55, '59Lives with husband. Daughter moved in few months ago to help parents, to cook for her mom (celiac dz)    Electrocardiogram:  Afib RBBB rate 137  03/04/12  Assessment and Plan

## 2012-03-20 NOTE — Assessment & Plan Note (Signed)
Chronic afib Continue cardizem for rate control and xanax for agitation Told daughter she could give her an additional cardizem if HR still high after xanax.  Not a candidate for anticoagulation

## 2012-03-20 NOTE — Patient Instructions (Signed)
Your physician recommends that you schedule a follow-up appointment in: AS NEEDED  Your physician recommends that you continue on your current medications as directed. Please refer to the Current Medication list given to you today.  

## 2012-03-31 ENCOUNTER — Other Ambulatory Visit: Payer: Self-pay | Admitting: Internal Medicine

## 2012-05-20 ENCOUNTER — Emergency Department (HOSPITAL_COMMUNITY): Payer: Medicare Other

## 2012-05-20 ENCOUNTER — Inpatient Hospital Stay (HOSPITAL_COMMUNITY)
Admission: EM | Admit: 2012-05-20 | Discharge: 2012-05-22 | DRG: 057 | Disposition: A | Discharge status: Home Health | Payer: Medicare Other | Attending: Emergency Medicine | Admitting: Emergency Medicine

## 2012-05-20 DIAGNOSIS — F0281 Dementia in other diseases classified elsewhere with behavioral disturbance: Secondary | ICD-10-CM

## 2012-05-20 DIAGNOSIS — I4891 Unspecified atrial fibrillation: Secondary | ICD-10-CM | POA: Diagnosis present

## 2012-05-20 DIAGNOSIS — Q283 Other malformations of cerebral vessels: Secondary | ICD-10-CM

## 2012-05-20 DIAGNOSIS — Z882 Allergy status to sulfonamides status: Secondary | ICD-10-CM

## 2012-05-20 DIAGNOSIS — G301 Alzheimer's disease with late onset: Secondary | ICD-10-CM | POA: Diagnosis present

## 2012-05-20 DIAGNOSIS — I10 Essential (primary) hypertension: Secondary | ICD-10-CM | POA: Diagnosis present

## 2012-05-20 DIAGNOSIS — Z823 Family history of stroke: Secondary | ICD-10-CM

## 2012-05-20 DIAGNOSIS — Z8659 Personal history of other mental and behavioral disorders: Secondary | ICD-10-CM

## 2012-05-20 DIAGNOSIS — F02818 Dementia in other diseases classified elsewhere, unspecified severity, with other behavioral disturbance: Secondary | ICD-10-CM

## 2012-05-20 DIAGNOSIS — N390 Urinary tract infection, site not specified: Secondary | ICD-10-CM

## 2012-05-20 DIAGNOSIS — Z880 Allergy status to penicillin: Secondary | ICD-10-CM

## 2012-05-20 DIAGNOSIS — G309 Alzheimer's disease, unspecified: Principal | ICD-10-CM | POA: Diagnosis present

## 2012-05-20 DIAGNOSIS — F0280 Dementia in other diseases classified elsewhere without behavioral disturbance: Principal | ICD-10-CM | POA: Diagnosis present

## 2012-05-20 DIAGNOSIS — E538 Deficiency of other specified B group vitamins: Secondary | ICD-10-CM | POA: Diagnosis present

## 2012-05-20 DIAGNOSIS — R627 Adult failure to thrive: Secondary | ICD-10-CM

## 2012-05-20 DIAGNOSIS — R4182 Altered mental status, unspecified: Secondary | ICD-10-CM | POA: Diagnosis present

## 2012-05-20 LAB — COMPREHENSIVE METABOLIC PANEL
Albumin: 3.6 g/dL (ref 3.5–5.2)
BUN: 24 mg/dL — ABNORMAL HIGH (ref 6–23)
Creatinine, Ser: 0.8 mg/dL (ref 0.50–1.10)
Total Bilirubin: 0.3 mg/dL (ref 0.3–1.2)
Total Protein: 6.9 g/dL (ref 6.0–8.3)

## 2012-05-20 LAB — CBC WITH DIFFERENTIAL/PLATELET
Basophils Relative: 0 % (ref 0–1)
HCT: 38.3 % (ref 36.0–46.0)
Hemoglobin: 12.4 g/dL (ref 12.0–15.0)
Lymphocytes Relative: 28 % (ref 12–46)
MCHC: 32.4 g/dL (ref 30.0–36.0)
Monocytes Absolute: 1 10*3/uL (ref 0.1–1.0)
Monocytes Relative: 12 % (ref 3–12)
Neutro Abs: 4.8 10*3/uL (ref 1.7–7.7)

## 2012-05-20 LAB — POCT I-STAT 3, ART BLOOD GAS (G3+)
Acid-Base Excess: 6 mmol/L — ABNORMAL HIGH (ref 0.0–2.0)
Bicarbonate: 30.2 mEq/L — ABNORMAL HIGH (ref 20.0–24.0)
O2 Saturation: 97 %
Patient temperature: 98.6
TCO2: 31 mmol/L (ref 0–100)
pH, Arterial: 7.477 — ABNORMAL HIGH (ref 7.350–7.450)

## 2012-05-20 LAB — APTT: aPTT: 29 seconds (ref 24–37)

## 2012-05-20 LAB — URINALYSIS, ROUTINE W REFLEX MICROSCOPIC
Bilirubin Urine: NEGATIVE
Specific Gravity, Urine: 1.019 (ref 1.005–1.030)
pH: 7.5 (ref 5.0–8.0)

## 2012-05-20 LAB — PROTIME-INR: Prothrombin Time: 13.8 seconds (ref 11.6–15.2)

## 2012-05-20 LAB — URINE MICROSCOPIC-ADD ON

## 2012-05-20 LAB — TROPONIN I: Troponin I: <0.3 ng/mL (ref <0.30)

## 2012-05-20 LAB — GLUCOSE, CAPILLARY: Glucose-Capillary: 108 mg/dL — ABNORMAL HIGH (ref 70–99)

## 2012-05-20 MED ORDER — DEXTROSE 5 % IV SOLN
1.0000 g | Freq: Once | INTRAVENOUS | Status: AC
  Administered 2012-05-20: 1 g via INTRAVENOUS
  Filled 2012-05-20: qty 10

## 2012-05-20 MED ORDER — ONDANSETRON HCL 4 MG/2ML IJ SOLN: 4.0000 mg | Freq: Four times a day (QID) | INTRAMUSCULAR | Status: DC | PRN

## 2012-05-20 MED ORDER — DILTIAZEM HCL 60 MG PO TABS
60.0000 mg | ORAL_TABLET | Freq: Three times a day (TID) | ORAL | Status: DC
  Administered 2012-05-21 – 2012-05-22 (×4): 60 mg via ORAL
  Filled 2012-05-20 (×8): qty 1

## 2012-05-20 MED ORDER — ONDANSETRON HCL 4 MG PO TABS: 4.0000 mg | ORAL_TABLET | Freq: Four times a day (QID) | ORAL | Status: DC | PRN

## 2012-05-20 MED ORDER — ACETAMINOPHEN 325 MG PO TABS: 650.0000 mg | ORAL_TABLET | Freq: Four times a day (QID) | ORAL | Status: DC | PRN

## 2012-05-20 MED ORDER — SODIUM CHLORIDE 0.9 % IV BOLUS (SEPSIS)
500.0000 mL | Freq: Once | INTRAVENOUS | Status: AC
  Administered 2012-05-20: 500 mL via INTRAVENOUS

## 2012-05-20 MED ORDER — OLANZAPINE 2.5 MG PO TABS
2.5000 mg | ORAL_TABLET | Freq: Two times a day (BID) | ORAL | Status: DC
Start: 2012-05-20 — End: 2012-05-22
  Administered 2012-05-21 – 2012-05-22 (×3): 2.5 mg via ORAL
  Filled 2012-05-20 (×5): qty 1

## 2012-05-20 MED ORDER — DEXTROSE 5 % IV SOLN: 1.0000 g | INTRAVENOUS | Status: DC

## 2012-05-20 MED ORDER — SODIUM CHLORIDE 0.9 % IV SOLN: INTRAVENOUS | Status: DC

## 2012-05-20 MED ORDER — LORAZEPAM 0.5 MG PO TABS
0.5000 mg | ORAL_TABLET | Freq: Four times a day (QID) | ORAL | Status: DC | PRN
  Administered 2012-05-21 – 2012-05-22 (×3): 0.5 mg via ORAL
  Filled 2012-05-20 (×3): qty 1

## 2012-05-20 MED ORDER — SODIUM CHLORIDE 0.9 % IJ SOLN: 3.0000 mL | Freq: Two times a day (BID) | INTRAMUSCULAR | Status: DC

## 2012-05-20 MED ORDER — DIVALPROEX SODIUM 125 MG PO CPSP
125.0000 mg | ORAL_CAPSULE | Freq: Two times a day (BID) | ORAL | Status: DC
  Administered 2012-05-21 – 2012-05-22 (×3): 125 mg via ORAL
  Filled 2012-05-20 (×5): qty 1

## 2012-05-20 MED ORDER — MIRTAZAPINE 15 MG PO TABS
15.0000 mg | ORAL_TABLET | Freq: Every day | ORAL | Status: DC
  Administered 2012-05-21: 15 mg via ORAL
  Filled 2012-05-20 (×3): qty 1

## 2012-05-20 MED ORDER — SODIUM CHLORIDE 0.9 % IV SOLN
INTRAVENOUS | Status: DC
  Administered 2012-05-21 – 2012-05-22 (×3): via INTRAVENOUS

## 2012-05-20 MED ORDER — DOCUSATE SODIUM 100 MG PO CAPS
100.0000 mg | ORAL_CAPSULE | Freq: Two times a day (BID) | ORAL | Status: DC
  Administered 2012-05-21 (×2): 100 mg via ORAL
  Filled 2012-05-20 (×4): qty 1

## 2012-05-20 MED ORDER — ACETAMINOPHEN 650 MG RE SUPP: 650.0000 mg | Freq: Four times a day (QID) | RECTAL | Status: DC | PRN

## 2012-05-20 NOTE — ED Notes (Signed)
Per EMS, pt from home, per family pt c/o (R) side abd pain and decrease appetite starting today, pt only responsive to sternal rub, per EMS pt's O2 sats and RR decreased en route to our facility, assisted RR w/BVM, pads placed, 22g IV SL LH, EMS unable to obtain BP manually, pt originally NSR on monitor and quickly converted to possible V-Fib, pad then applied and pt converted back to NSR

## 2012-05-20 NOTE — ED Notes (Signed)
CBG 108. 

## 2012-05-20 NOTE — H&P (Signed)
[facility] History and Physical  Jill Mckinney ZOX:[id number] DOB: 1923-07-26 DOA: 05/20/2012  Referring physician: Dr. Bernette Mayers PCP: Illene Regulus, MD  Specialists: none  Chief Complaint: AMS  HPI: Jill Mckinney is a 78 y.o. female  With alzheimers dementia, HTN, Afib, h/o Vitamin B 12 deficiency, and FTT that presented to the ED initially due to abdominal discomfort and through the course of her visit had limited interaction with ED personnel.  When I walked into the room to interview patient her response was "leave me alone." The history is mainly obtained from the chart and ED physician.  Per discussion with ED physician patient was brought in for evaluation due to abdominal discomfort and decreased appetite.  Per EMR patient had decrease in mental status and maintained her eyes shut during examination until they used ammonia which then patient opened eyes and was more responsive.  ED laboratory showed a U/A which was cloudy with large leukocytes and trace hgb with few bacteria.  Patient was given Rocephin and we were consulted for further evaluation for admission secondary to UTI associated with AMS.  Review of Systems: Unable to obtain due to dementia and lack of patient cooperation  Past Medical History  Diagnosis Date  . CATARACT EXTRACTION, HX OF 06/02/2007  . Celiac disease 06/02/2007  . CHEST WALL PAIN, ANTERIOR 11/24/2009  . Constipation 04/22/2011  . DEMENTIA, HX OF 06/02/2007  . FREQUENCY, URINARY 11/26/2008  . HEARING LOSS 06/02/2007  . Hypertension 04/24/2011  . INSOMNIA, CHRONIC 08/21/2010  . OSTEOPOROSIS 06/02/2007  . Other acquired absence of organ 06/02/2007  . ROTATOR CUFF REPAIR, HX OF 06/02/2007  . STASIS ULCER 06/02/2007  . TACHYARRHYTHMIA 06/02/2007  . Unspecified psychosis 06/02/2007  . UTI (lower urinary tract infection) 04/22/2011  . VITAMIN B12 DEFICIENCY 06/02/2007   Past Surgical History  Procedure Date  . Cardiac electrophysiology mapping and  ablation   . Hand surgery   . Rotator cuff repair   . Excision large mole removed from back & breast   . Cholecystectomy   . Tonsillectomy   . Orif ankle dislocation   . Cataract extraction    Social History:  reports that she has never smoked. She has never used smokeless tobacco. She reports that she does not drink alcohol. Her drug history not on file. Pt lives in facility   Allergies  Allergen Reactions  . Codeine Nausea And Vomiting  . Penicillins Rash  . Sulfa Antibiotics Rash    Family History  Problem Relation Age of Onset  . Stroke Mother   . Lung cancer Father   . Breast cancer Maternal Aunt   unable to obtain from patient  Prior to Admission medications   Medication Sig Start Date End Date Taking? Authorizing Provider  acetaminophen (TYLENOL) 325 MG tablet Take 2 tablets (650 mg total) by mouth every 6 (six) hours as needed (or Fever >/= 101). 02/06/12 02/05/13  Jacques Navy, MD  aspirin EC 81 MG tablet Take 81 mg by mouth daily.      Historical Provider, MD  B Complex-C (SUPER B COMPLEX PO) Take 1 tablet by mouth daily.      Historical Provider, MD  Calcium Citrate-Vitamin D (CALCIUM CITRATE + PO) Take 2 tablets by mouth 2 (two) times daily.      Historical Provider, MD  ciprofloxacin (CIPRO) 250 MG tablet Take 1 tablet (250 mg total) by mouth daily. 03/09/12   Jacques Navy, MD  diltiazem (CARDIZEM) 60 MG tablet TAKE 1 TABLET  BY MOUTH EVERY 8 HOURS 03/31/12   Jacques Navy, MD  divalproex (DEPAKOTE SPRINKLE) 125 MG capsule Take 125 mg by mouth 2 (two) times daily.    Historical Provider, MD  docusate sodium (COLACE) 100 MG capsule Take 100 mg by mouth 2 (two) times daily.      Historical Provider, MD  folic acid (FOLVITE) 400 MCG tablet Take 400 mcg by mouth daily.      Historical Provider, MD  furosemide (LASIX) 20 MG tablet TAKE 1 TABLET BY MOUTH EVERY DAY 03/31/12   Jacques Navy, MD  LORazepam (ATIVAN) 0.5 MG tablet Take 0.5 mg by mouth every 6 (six)  hours as needed. anxiety    Historical Provider, MD  Magnesium Hydroxide (MILK OF MAGNESIA PO) Take 5 mLs by mouth 3 (three) times daily with meals.    Historical Provider, MD  mirtazapine (REMERON) 15 MG tablet Take 15 mg by mouth at bedtime.    Historical Provider, MD  OLANZapine (ZYPREXA) 2.5 MG tablet Take 2.5 mg by mouth 2 (two) times daily.    Historical Provider, MD  OVER THE COUNTER MEDICATION Place 1 suppository rectally daily as needed. For constipation    Historical Provider, MD  traMADol (ULTRAM) 50 MG tablet Take 1 tablet (50 mg total) by mouth 3 (three) times daily. 03/09/12   Jacques Navy, MD  traMADol (ULTRAM-ER) 100 MG 24 hr tablet TAKE ONE-HALF TABLET BY MOUTH 3 TIMES A DAY 03/31/12   Jacques Navy, MD   Physical Exam: Filed Vitals:   05/20/12 1531 05/20/12 1700  BP: 137/75 108/51  Pulse: 78 81  Temp: 97 F (36.1 C) 97.7 F (36.5 C)  TempSrc: Rectal Other (Comment)  Resp: 34 16  SpO2: 100% 99%     General:  Pt in NAD, responds leave me alone when I try to examine patient.  Eyes: Unable to examine due to limited patient cooperation  ENT: normal exterior appearance, dry mucous membranes  Neck: supple, no goiter  Cardiovascular: RRR, No MRG  Respiratory: No increased work of breathing, no wheezes anteriorly  Abdomen: Patient did not let me examine abdomen  Skin: warm and dry  Musculoskeletal: No clubbing, limited examination due to patient cooperation.  Psychiatric: Unable to examine due to limited patient cooperation  Neurologic: Unable to examine due to limited patient cooperation  Labs on Admission:  Basic Metabolic Panel:  Lab 05/20/12 1610  NA 140  K 4.2  CL 100  CO2 32  GLUCOSE 106*  BUN 24*  CREATININE 0.80  CALCIUM 9.8  MG --  PHOS --   Liver Function Tests:  Lab 05/20/12 1550  AST 24  ALT 15  ALKPHOS 86  BILITOT 0.3  PROT 6.9  ALBUMIN 3.6   No results found for this basename: LIPASE:5,AMYLASE:5 in the last 168  hours No results found for this basename: AMMONIA:5 in the last 168 hours CBC:  Lab 05/20/12 1550  WBC 8.4  NEUTROABS 4.8  HGB 12.4  HCT 38.3  MCV 92.5  PLT 312   Cardiac Enzymes:  Lab 05/20/12 1540  CKTOTAL --  CKMB --  CKMBINDEX --  TROPONINI <0.30    BNP (last 3 results)  Basename 03/07/12 1736 02/08/12 0545  PROBNP 1719.0* 3164.0*   CBG:  Lab 05/20/12 1533  GLUCAP 108*    Radiological Exams on Admission: Ct Head Wo Contrast  05/20/2012  *RADIOLOGY REPORT*  Clinical Data: Unresponsive to verbal stimuli.  CT HEAD WITHOUT CONTRAST  Technique:  Contiguous axial  images were obtained from the base of the skull through the vertex without contrast.  Comparison: 03/07/2012 and 10/02/2007.  Findings: Within the anterior right frontal lobe, 4.7 mm hyperdense rounded structure appears larger than on the prior examinations when this measured 2 mm.  This may represent a small amount of hemorrhage associated with underlying cavernoma.  No other areas of intracranial hemorrhage or mass noted.  If there are progressive symptoms this can be reevaluated on follow-up imaging.  Global atrophy.  Ventricular prominence probably related to atrophy and unchanged from the exams.  Prominent small vessel disease type changes without CT evidence of large acute infarct.  IMPRESSION: Question small anterior right frontal cavernoma which has bled since the most recent CT as detailed above.  Atrophy and prominent small vessel disease type changes.  Results discussed with Dr. Renae Gloss 05/20/2012 4:40 p.m.   Original Report Authenticated By: Lacy Duverney, M.D.    Dg Chest Port 1 View  05/20/2012  *RADIOLOGY REPORT*  Clinical Data: Altered mental status  PORTABLE CHEST - 1 VIEW  Comparison: 03/07/1929  Findings: Cardiomediastinal silhouette is stable.  No acute infiltrate or pleural effusion. The left lung base and left costophrenic angle are obscured by artifacts on left patient's chest wall.  Stable thoracic  spine osteopenia. Probable old right lower rib fractures.  IMPRESSION: No active disease.  Suboptimal exam as described above.   Original Report Authenticated By: Natasha Mead, M.D.     EKG: Independently reviewed. Sinus Rhythm with no ST elevation or depressions with artifact  Assessment/Plan Principal Problem:  *UTI (lower urinary tract infection) Active Problems:  VITAMIN B12 DEFICIENCY  Alzheimer's dementia, late onset, with behavioral disturbance  DEMENTIA, HX OF  Hypertension  A-fib  Altered mental status Right frontal cavernoma  1. At this point patient's mental status most likely due to UTI given U/A results.  I will plan on continuing Rocephin. Obtain urine cultures.  Unsure what baseline is given history of Alzheimer's dementia.  Given that patient has h/o vitamin B 12 deficiency will also order vitamin B 12 level and other lab tests associated with AMS (folate, RPR, TSH).  Will continue home regimen for atrial fibrillation. On CT of head patient had "right frontal cavernoma which has bled since the most recent CT." Discussed case with neurosurgery who mentioned that finding is of no clinical significance.  Therefore presume that change in mental status most likely 2ary to infectious etiology/ UTI.  Will treat with IV antibiotics and obtain urine culture. Will hold aspirin at this juncture.   Other plans per orders please review for details.   Code Status: DNR Family Communication: No family at bedside Disposition Plan: Pending further evaluation and recommendations.  Time spent: > 55 minutes  Penny Pia [facility] Pager [phone]  If 7PM-7AM, please contact night-coverage [email or web address] Password [facility] 05/20/2012, 5:47 PM

## 2012-05-20 NOTE — ED Notes (Signed)
Pt remains unresponsive to verbal stimuli but will hold eyes shut when trying to visualize pupils.  Pt opens eyes and responds with ammonia inhalent.  Pt to CT via stretcher.  Only c/o being cold.

## 2012-05-20 NOTE — Progress Notes (Signed)
ANTIBIOTIC CONSULT NOTE - INITIAL  Pharmacy Consult:  Rocephin Indication:  UTI  Allergies  Allergen Reactions  . Codeine Nausea And Vomiting  . Penicillins Rash  . Sulfa Antibiotics Rash    Patient Measurements: Height = 168 cm Weight = 54.9 kg  Vital Signs: Temp: 97 F (36.1 C) (12/03 1945) Temp src: Other (Comment) (12/03 1700) BP: 95/41 mmHg (12/03 1945) Pulse Rate: 78  (12/03 1945)  Labs:  Basename 05/20/12 1550  WBC 8.4  HGB 12.4  PLT 312  LABCREA --  CREATININE 0.80   The CrCl is unknown because both a height and weight (above a minimum accepted value) are required for this calculation. No results found for this basename: VANCOTROUGH:2,VANCOPEAK:2,VANCORANDOM:2,GENTTROUGH:2,GENTPEAK:2,GENTRANDOM:2,TOBRATROUGH:2,TOBRAPEAK:2,TOBRARND:2,AMIKACINPEAK:2,AMIKACINTROU:2,AMIKACIN:2, in the last 72 hours   Microbiology: No results found for this or any previous visit (from the past 720 hour(s)).  Medical History: Past Medical History  Diagnosis Date  . CATARACT EXTRACTION, HX OF 06/02/2007  . Celiac disease 06/02/2007  . CHEST WALL PAIN, ANTERIOR 11/24/2009  . Constipation 04/22/2011  . DEMENTIA, HX OF 06/02/2007  . FREQUENCY, URINARY 11/26/2008  . HEARING LOSS 06/02/2007  . Hypertension 04/24/2011  . INSOMNIA, CHRONIC 08/21/2010  . OSTEOPOROSIS 06/02/2007  . Other acquired absence of organ 06/02/2007  . ROTATOR CUFF REPAIR, HX OF 06/02/2007  . STASIS ULCER 06/02/2007  . TACHYARRHYTHMIA 06/02/2007  . Unspecified psychosis 06/02/2007  . UTI (lower urinary tract infection) 04/22/2011  . VITAMIN B12 DEFICIENCY 06/02/2007       Assessment: 56 YOF admitted with AMS.  His UA is cloudy and contains large leukocytes, trace hemoglobin and few bacteria; therefore, Rocephin was ordered and given.  Pharmacy consulted to dose Rocephin for UTI.  Labs unremarkable.  Noted his allergy to penicillin (rash), but patient has received Rocephin without complaints.   Goal of  Therapy:  Clearance of infection   Plan:  - Rocephin 1gm IV Q24H, start tomorrow - Pharmacy will sign off as no adjustment necessary.  Thank you for the consult. - LOT per MD    Chelsea Aus D. Laney Potash, PharmD, BCPS Pager:  [phone] 05/20/2012, 7:59 PM

## 2012-05-20 NOTE — ED Provider Notes (Signed)
History     CSN: [id number]  Arrival date & time 05/20/12  1526   First MD Initiated Contact with Patient 05/20/12 1526      No chief complaint on file.   (Consider location/radiation/quality/duration/timing/severity/associated sxs/prior treatment) HPI Level 5 caveat due to unresponsive Pt with multiple medical problems brought to the ED via EMS from home. EMS reports they were initially called for decreased appetite and abdominal pain. Pt was initially alert but confused as per baseline, however en route she has had decreased in mental status and now unresponsive. She has had normal vital signs while en route, although there was a question of a brief episode of v-tach on the monitor while driving but had resolved after they pulled over to recheck her.   Past Medical History  Diagnosis Date  . CATARACT EXTRACTION, HX OF 06/02/2007  . Celiac disease 06/02/2007  . CHEST WALL PAIN, ANTERIOR 11/24/2009  . Constipation 04/22/2011  . DEMENTIA, HX OF 06/02/2007  . FREQUENCY, URINARY 11/26/2008  . HEARING LOSS 06/02/2007  . Hypertension 04/24/2011  . INSOMNIA, CHRONIC 08/21/2010  . OSTEOPOROSIS 06/02/2007  . Other acquired absence of organ 06/02/2007  . ROTATOR CUFF REPAIR, HX OF 06/02/2007  . STASIS ULCER 06/02/2007  . TACHYARRHYTHMIA 06/02/2007  . Unspecified psychosis 06/02/2007  . UTI (lower urinary tract infection) 04/22/2011  . VITAMIN B12 DEFICIENCY 06/02/2007    Past Surgical History  Procedure Date  . Cardiac electrophysiology mapping and ablation   . Hand surgery   . Rotator cuff repair   . Excision large mole removed from back & breast   . Cholecystectomy   . Tonsillectomy   . Orif ankle dislocation   . Cataract extraction     Family History  Problem Relation Age of Onset  . Stroke Mother   . Lung cancer Father   . Breast cancer Maternal Aunt     History  Substance Use Topics  . Smoking status: Never Smoker   . Smokeless tobacco: Never Used  . Alcohol Use: No     OB History    Grav Para Term Preterm Abortions TAB SAB Ect Mult Living                  Review of Systems Unable to assess due to mental status.   Allergies  Codeine; Penicillins; and Sulfa antibiotics  Home Medications   Current Outpatient Rx  Name  Route  Sig  Dispense  Refill  . ACETAMINOPHEN 325 MG PO TABS   Oral   Take 2 tablets (650 mg total) by mouth every 6 (six) hours as needed (or Fever >/= 101).   60 tablet   11   . ASPIRIN EC 81 MG PO TBEC   Oral   Take 81 mg by mouth daily.           . SUPER B COMPLEX PO   Oral   Take 1 tablet by mouth daily.           Marland Kitchen CALCIUM CITRATE + PO   Oral   Take 2 tablets by mouth 2 (two) times daily.           Marland Kitchen CIPROFLOXACIN HCL 250 MG PO TABS   Oral   Take 1 tablet (250 mg total) by mouth daily.   30 tablet   3   . DILTIAZEM HCL 60 MG PO TABS      TAKE 1 TABLET BY MOUTH EVERY 8 HOURS   90 tablet   3   .  DIVALPROEX SODIUM 125 MG PO CPSP   Oral   Take 125 mg by mouth 2 (two) times daily.         Marland Kitchen DOCUSATE SODIUM 100 MG PO CAPS   Oral   Take 100 mg by mouth 2 (two) times daily.           Marland Kitchen FOLIC ACID 400 MCG PO TABS   Oral   Take 400 mcg by mouth daily.           . FUROSEMIDE 20 MG PO TABS      TAKE 1 TABLET BY MOUTH EVERY DAY   90 tablet   3   . LORAZEPAM 0.5 MG PO TABS   Oral   Take 0.5 mg by mouth every 6 (six) hours as needed. anxiety         . MILK OF MAGNESIA PO   Oral   Take 5 mLs by mouth 3 (three) times daily with meals.         Marland Kitchen MIRTAZAPINE 15 MG PO TABS   Oral   Take 15 mg by mouth at bedtime.         Marland Kitchen OLANZAPINE 2.5 MG PO TABS   Oral   Take 2.5 mg by mouth 2 (two) times daily.         Marland Kitchen OVER THE COUNTER MEDICATION   Rectal   Place 1 suppository rectally daily as needed. For constipation         . TRAMADOL HCL 50 MG PO TABS   Oral   Take 1 tablet (50 mg total) by mouth 3 (three) times daily.   90 tablet   5   . TRAMADOL HCL ER 100 MG PO TB24       TAKE ONE-HALF TABLET BY MOUTH 3 TIMES A DAY   45 tablet   3     There were no vitals taken for this visit.  Physical Exam  Nursing note and vitals reviewed. Constitutional: She appears well-developed and well-nourished.  HENT:  Head: Normocephalic and atraumatic.  Eyes: Pupils are equal, round, and reactive to light.  Neck: Neck supple.  Cardiovascular: Normal rate, normal heart sounds and intact distal pulses.   Pulmonary/Chest: Effort normal and breath sounds normal.  Abdominal: Soft. Bowel sounds are normal. She exhibits distension (? bladder distension).  Musculoskeletal: She exhibits no edema.  Lymphadenopathy:    She has no cervical adenopathy.  Neurological: She is unresponsive.       Unable to fully assess due to mental status, does not respond to verbal or noxious stimuli  Skin: Skin is warm and dry.  Psychiatric:       Unable to assess    ED Course  Procedures (including critical care time)  Labs Reviewed  GLUCOSE, CAPILLARY - Abnormal; Notable for the following:    Glucose-Capillary 108 (*)     All other components within normal limits  CBC WITH DIFFERENTIAL - Abnormal; Notable for the following:    RDW 16.1 (*)     All other components within normal limits  URINALYSIS, ROUTINE W REFLEX MICROSCOPIC - Abnormal; Notable for the following:    APPearance CLOUDY (*)     Hgb urine dipstick TRACE (*)     Protein, ur 30 (*)     Leukocytes, UA LARGE (*)     All other components within normal limits  COMPREHENSIVE METABOLIC PANEL - Abnormal; Notable for the following:    Glucose, Bld 106 (*)     BUN  24 (*)     GFR calc non Af Amer 65 (*)     GFR calc Af Amer 75 (*)     All other components within normal limits  POCT I-STAT 3, BLOOD GAS (G3+) - Abnormal; Notable for the following:    pH, Arterial 7.477 (*)     Bicarbonate 30.2 (*)     Acid-Base Excess 6.0 (*)     All other components within normal limits  URINE MICROSCOPIC-ADD ON - Abnormal; Notable for the  following:    Bacteria, UA FEW (*)     Casts HYALINE CASTS (*)     All other components within normal limits  TROPONIN I  PROTIME-INR  APTT  URINE CULTURE  BASIC METABOLIC PANEL  CBC  VITAMIN B12  FOLATE  RPR  TSH   Ct Head Wo Contrast  05/20/2012  *RADIOLOGY REPORT*  Clinical Data: Unresponsive to verbal stimuli.  CT HEAD WITHOUT CONTRAST  Technique:  Contiguous axial images were obtained from the base of the skull through the vertex without contrast.  Comparison: 03/07/2012 and 10/02/2007.  Findings: Within the anterior right frontal lobe, 4.7 mm hyperdense rounded structure appears larger than on the prior examinations when this measured 2 mm.  This may represent a small amount of hemorrhage associated with underlying cavernoma.  No other areas of intracranial hemorrhage or mass noted.  If there are progressive symptoms this can be reevaluated on follow-up imaging.  Global atrophy.  Ventricular prominence probably related to atrophy and unchanged from the exams.  Prominent small vessel disease type changes without CT evidence of large acute infarct.  IMPRESSION: Question small anterior right frontal cavernoma which has bled since the most recent CT as detailed above.  Atrophy and prominent small vessel disease type changes.  Results discussed with Dr. Renae Gloss 05/20/2012 4:40 p.m.   Original Report Authenticated By: Lacy Duverney, M.D.    Dg Chest Port 1 View  05/20/2012  *RADIOLOGY REPORT*  Clinical Data: Altered mental status  PORTABLE CHEST - 1 VIEW  Comparison: [birth year]1929  Findings: Cardiomediastinal silhouette is stable.  No acute infiltrate or pleural effusion. The left lung base and left costophrenic angle are obscured by artifacts on left patient's chest wall.  Stable thoracic spine osteopenia. Probable old right lower rib fractures.  IMPRESSION: No active disease.  Suboptimal exam as described above.   Original Report Authenticated By: Natasha Mead, M.D.      1. Urinary tract infection    2. Altered mental status       MDM   Date: 05/20/2012  Rate: 77  Rhythm: normal sinus rhythm  QRS Axis: right  Intervals: normal  ST/T Wave abnormalities: normal  Conduction Disutrbances:none  Narrative Interpretation:   Old EKG Reviewed: changes noted, previously in afib   Discussed results with Hospitalist including likely incidental  CT finding. Treated for uti       Alvon Nygaard B. Bernette Mayers, MD 05/21/12 [phone]

## 2012-05-21 ENCOUNTER — Telehealth: Payer: Self-pay | Admitting: *Deleted

## 2012-05-21 DIAGNOSIS — Q283 Other malformations of cerebral vessels: Secondary | ICD-10-CM

## 2012-05-21 DIAGNOSIS — R4182 Altered mental status, unspecified: Secondary | ICD-10-CM

## 2012-05-21 DIAGNOSIS — F0281 Dementia in other diseases classified elsewhere with behavioral disturbance: Secondary | ICD-10-CM

## 2012-05-21 DIAGNOSIS — F0280 Dementia in other diseases classified elsewhere without behavioral disturbance: Principal | ICD-10-CM

## 2012-05-21 DIAGNOSIS — N390 Urinary tract infection, site not specified: Secondary | ICD-10-CM

## 2012-05-21 LAB — FOLATE: Folate: >20 ng/mL

## 2012-05-21 LAB — BASIC METABOLIC PANEL
Chloride: 106 mEq/L (ref 96–112)
Creatinine, Ser: 0.58 mg/dL (ref 0.50–1.10)
GFR calc Af Amer: >90 mL/min (ref >90)
Sodium: 141 mEq/L (ref 135–145)

## 2012-05-21 LAB — CBC
HCT: 38.8 % (ref 36.0–46.0)
Platelets: 300 10*3/uL (ref 150–400)
RDW: 16 % — ABNORMAL HIGH (ref 11.5–15.5)
WBC: 6 10*3/uL (ref 4.0–10.5)

## 2012-05-21 LAB — RPR: RPR Ser Ql: NONREACTIVE

## 2012-05-21 MED ORDER — ENSURE PUDDING PO PUDG
1.0000 | Freq: Three times a day (TID) | ORAL | Status: DC
  Administered 2012-05-21 – 2012-05-22 (×4): 1 via ORAL

## 2012-05-21 MED ORDER — CEFUROXIME AXETIL 250 MG PO TABS
250.0000 mg | ORAL_TABLET | Freq: Two times a day (BID) | ORAL | Status: DC
  Administered 2012-05-21 – 2012-05-22 (×3): 250 mg via ORAL
  Filled 2012-05-21 (×5): qty 1

## 2012-05-21 NOTE — Progress Notes (Signed)
Patient arrived on floor at approximately 2200 05/20/2012.  She arrived via stretcher from the ED.  No family is at the bedside.  She is easily agitated and wants to be left alone.  Telemetry placed.  Refuses to have dentures removed.  Unable to obtain any history to complete medical history.   Hearing aids in denture cup at the bedside.  Wounds documented.  Patient is a high fall risk.  She is from home with her daughter.  Alexya Mcdaris, Justine Null.

## 2012-05-21 NOTE — Telephone Encounter (Signed)
Case Manager at [facility] called regarding pt-pt was admitted as observation status but now does meet criteria as impatient-if you have any questions you can call Annette at [phone].

## 2012-05-21 NOTE — Progress Notes (Signed)
INITIAL ADULT NUTRITION ASSESSMENT Date: 05/21/2012   Time: 12:01 PM  Reason for Assessment: Low Braden  INTERVENTION: 1. RD to clarify diet order, pt requires Gluten-Free diet 2. Ensure Pudding po TID, each supplement provides 170 kcal and 4 grams of protein.  3. RD to continue to follow nutrition careplan  DOCUMENTATION CODES Per approved criteria  -Not Applicable   ASSESSMENT: Female 78 y.o.  Dx: UTI (lower urinary tract infection)  Hx:  Past Medical History  Diagnosis Date  . CATARACT EXTRACTION, HX OF 06/02/2007  . Celiac disease 06/02/2007  . CHEST WALL PAIN, ANTERIOR 11/24/2009  . Constipation 04/22/2011  . DEMENTIA, HX OF 06/02/2007  . FREQUENCY, URINARY 11/26/2008  . HEARING LOSS 06/02/2007  . Hypertension 04/24/2011  . INSOMNIA, CHRONIC 08/21/2010  . OSTEOPOROSIS 06/02/2007  . Other acquired absence of organ 06/02/2007  . ROTATOR CUFF REPAIR, HX OF 06/02/2007  . STASIS ULCER 06/02/2007  . TACHYARRHYTHMIA 06/02/2007  . Unspecified psychosis 06/02/2007  . UTI (lower urinary tract infection) 04/22/2011  . VITAMIN B12 DEFICIENCY 06/02/2007   Past Surgical History  Procedure Date  . Cardiac electrophysiology mapping and ablation   . Hand surgery   . Rotator cuff repair   . Excision large mole removed from back & breast   . Cholecystectomy   . Tonsillectomy   . Orif ankle dislocation   . Cataract extraction    Related Meds:     . [COMPLETED] cefTRIAXone (ROCEPHIN)  IV  1 g Intravenous Once  . cefUROXime  250 mg Oral BID WC  . diltiazem  60 mg Oral Q8H  . divalproex  125 mg Oral BID  . docusate sodium  100 mg Oral BID  . mirtazapine  15 mg Oral QHS  . OLANZapine  2.5 mg Oral BID  . [COMPLETED] sodium chloride  500 mL Intravenous Once  . sodium chloride  3 mL Intravenous Q12H  . [DISCONTINUED] cefTRIAXone (ROCEPHIN)  IV  1 g Intravenous Q24H   Ht:  5\' 1"  (154.9 cm)  Wt: 118 lb (53.524 kg)  Ideal Wt:    105 lb % Ideal Wt: 113%  Wt Readings from Last 15  Encounters:  05/20/12 118 lb (53.524 kg)  03/20/12 121 lb (54.885 kg)  03/11/12 121 lb (54.885 kg)  03/08/12 123 lb 0.3 oz (55.8 kg)  02/12/12 123 lb 8 oz (56.019 kg)  02/06/12 133 lb 9.6 oz (60.6 kg)  01/24/12 124 lb (56.246 kg)  06/18/11 109 lb 6 oz (49.612 kg)  04/20/11 124 lb 15.7 oz (56.69 kg)  08/21/10 114 lb (51.71 kg)  11/24/09 114 lb (51.71 kg)  01/21/09 117 lb (53.071 kg)  12/26/07 127 lb (57.607 kg)  01/17/07 126 lb (57.153 kg)  Usual Wt: 124 lb % Usual Wt: 95%; 5% wt loss x 3 months  BMI is 22.3 - WNL  Labs:  CMP     Component Value Date/Time   NA 140 05/20/2012 1550   K 4.2 05/20/2012 1550   CL 100 05/20/2012 1550   CO2 32 05/20/2012 1550   GLUCOSE 106* 05/20/2012 1550   BUN 24* 05/20/2012 1550   CREATININE 0.80 05/20/2012 1550   CALCIUM 9.8 05/20/2012 1550   PROT 6.9 05/20/2012 1550   ALBUMIN 3.6 05/20/2012 1550   AST 24 05/20/2012 1550   ALT 15 05/20/2012 1550   ALKPHOS 86 05/20/2012 1550   BILITOT 0.3 05/20/2012 1550   GFRNONAA 65* 05/20/2012 1550   GFRAA 75* 05/20/2012 1550   CBG (last 3)  Basename 05/20/12 1533  GLUCAP 108*   Phosphorus  Date/Time Value Range Status  04/20/2011  6:50 AM 3.8  2.3 - 4.6 mg/dL Final   Magnesium  Date/Time Value Range Status  04/20/2011  6:50 AM 2.3  1.5 - 2.5 mg/dL Final    Intake/Output Summary (Last 24 hours) at 05/21/12 1210 Last data filed at 05/21/12 0901  Gross per 24 hour  Intake    675 ml  Output      0 ml  Net    675 ml   Diet Order: Cardiac  Supplements/Tube Feeding: none  IVF:    sodium chloride Last Rate: 75 mL/hr at 05/21/12 0901  [DISCONTINUED] sodium chloride    Estimated Nutritional Needs:   Kcal: 1400 - 1600 Protein:  60 - 70 grams protein Fluid: 1.4 - 1.6 liters daily  Admitted with AMS. Work-up reveals UTI. Pt has advanced dementia and requires 100% assistance with all ADLs. From home with daughter. Pt is currently ordered for Heart Healthy diet with nectar thickened liquids.  Noted pt is  on remeron.  Per daughters, pt requires gluten-free restrictions. RD to clarify diet order. Pt is at nutrition risk with recent 5% wt loss x 3 months. This is not significant. Pt with low Braden score and is at risk for skin breakdown.  NUTRITION DIAGNOSIS: Inadequate oral intake r/t dementia AEB variable PO intake.  MONITORING/EVALUATION(Goals): Goal: Pt to meet >/= 90% of their estimated nutrition needs Monitor: weight trends, lab trends, I/O's, PO intake, supplement tolerance  EDUCATION NEEDS: -No education needs identified at this time  Jarold Motto MS, RD, LDN Pager: [phone] After-hours pager: [phone]

## 2012-05-21 NOTE — Evaluation (Signed)
Occupational Therapy Evaluation Patient Details Name: Jill Mckinney MRN: [id number] DOB: 03-14-1924 Today's Date: 05/21/2012 Time: 8657-8469 OT Time Calculation (min): 16 min  OT Assessment / Plan / Recommendation Clinical Impression  Pt admitted with UTI. History of blindness and dementia.  PTA, pt required max assist with ADLs and received 24/7 assist at home.  Will benefit from acute OT to address below problem list.  Will recommend d/c home with 24/7 assist if pt is able to reach transfer goals and pt family able to safely assist with transfer.    OT Assessment  Patient needs continued OT Services    Follow Up Recommendations  No OT follow up;Supervision/Assistance - 24 hour    Barriers to Discharge      Equipment Recommendations  None recommended by OT    Recommendations for Other Services    Frequency  Min 2X/week    Precautions / Restrictions Precautions Precautions: Fall Precaution Comments: Blind and HOH   Pertinent Vitals/Pain See vitals    ADL  Grooming: Performed;Wash/dry face;Wash/dry hands;Maximal assistance Where Assessed - Grooming: Supported sitting Upper Body Bathing: Performed;Maximal assistance Where Assessed - Upper Body Bathing: Supported sitting Equipment Used:  (none) Transfers/Ambulation Related to ADLs: not attempted due to pt agitation ADL Comments: Pt agitated during session.  Daughter reporting concern for pt to be able to assist family with transfer so that family can continue to be able to care for pt (family typically provides one person max assist during transfer).    OT Diagnosis: Generalized weakness  OT Problem List: Decreased strength;Decreased activity tolerance OT Treatment Interventions: Self-care/ADL training;Therapeutic activities;Patient/family education   OT Goals Acute Rehab OT Goals OT Goal Formulation: With patient/family Time For Goal Achievement: 06/04/12 Potential to Achieve Goals: Good ADL Goals Pt Will Transfer to  Toilet: with max assist;Stand pivot transfer;3-in-1 ADL Goal: Toilet Transfer - Progress: Goal set today Pt Will Perform Tub/Shower Transfer: Tub transfer;with max assist;Stand pivot transfer;Shower seat with back ADL Goal: Web designer - Progress: Goal set today Miscellaneous OT Goals Miscellaneous OT Goal #1: Pt will perform bed mobility with max assist at hospital bed level. OT Goal: Miscellaneous Goal #1 - Progress: Goal set today Miscellaneous OT Goal #2: Pt's family/caregiver will be independent in safely assisting pt with functional transfers. OT Goal: Miscellaneous Goal #2 - Progress: Goal set today  Visit Information  Last OT Received On: 05/21/12 Assistance Needed: +2 (+2 helpful, pt's participation is unpredictable)    Subjective Data      Prior Functioning     Home Living Lives With: Daughter Available Help at Discharge: Family;Personal care attendant;Available 24 hours/day Type of Home: House Home Layout: One level Bathroom Shower/Tub: Engineer, manufacturing systems: Standard Bathroom Accessibility: Yes How Accessible: Accessible via wheelchair Home Adaptive Equipment: Bedside commode/3-in-1;Walker - rolling;Wheelchair - manual;Hospital bed (pt has tub seat but unsure if it is bench or chair) Additional Comments: [name]  is with pt during day and daughter is home with pt at night. Prior Function Level of Independence: Needs assistance Needs Assistance: Bathing;Dressing;Feeding;Grooming;Toileting;Transfers Bath: Maximal Dressing: Maximal Feeding: Moderate Grooming: Maximal Toileting: Maximal Transfer Assistance: 1 person assist for stand pivots Comments: Daughter (who does not live with pt) at bedside and provided PLOF and home information.  Reports pt's other daughter (who lives with pt) has been doing well providing assist and plans to continue 24/7 assist at home.  Following OT eval, informed PT of PLOF since family was not available during PT session.   Communication Communication: [facility]  Vision/Perception     Cognition  Overall Cognitive Status: History of cognitive impairments - at baseline Arousal/Alertness: Awake/alert Orientation Level: Disoriented to;Place;Time;Situation Behavior During Session: Agitated    Extremity/Trunk Assessment Right Upper Extremity Assessment RUE ROM/Strength/Tone: WFL for tasks assessed;Unable to fully assess;Due to impaired cognition Left Upper Extremity Assessment LUE ROM/Strength/Tone: [facility] for tasks assessed;Unable to fully assess;Due to impaired cognition Right Lower Extremity Assessment RLE ROM/Strength/Tone: Deficits RLE ROM/Strength/Tone Deficits: Generally weak Left Lower Extremity Assessment LLE ROM/Strength/Tone: Deficits LLE ROM/Strength/Tone Deficits: Generally weak     Mobility Bed Mobility Bed Mobility: Not assessed Transfers Transfers: Not assessed     Shoulder Instructions     Exercise     Balance    End of Session OT - End of Session Equipment Utilized During Treatment:  (none) Activity Tolerance: Treatment limited secondary to agitation Patient left: in chair;with call bell/phone within reach;with family/visitor present  GO Functional Assessment Tool Used: clinical judgement Functional Limitation: Self care Self Care Current Status (W0981): At least 80 percent but less than 100 percent impaired, limited or restricted Self Care Goal Status (X9147): At least 60 percent but less than 80 percent impaired, limited or restricted  05/21/2012  Cipriano Mile OTR/L Pager [phone] Office [phone]  Cipriano Mile 05/21/2012, 3:00 PM

## 2012-05-21 NOTE — Progress Notes (Signed)
Physical Therapy Evaluation Patient Details Name: Jill Mckinney MRN: [id number] DOB: December 24, 1923 Today's Date: 05/21/2012 Time: 1010-1028 PT Time Calculation (min): 18 min  PT Assessment / Plan / Recommendation Clinical Impression  78 yo female admitted with UTI, with h/o demetia, blindness; presents with decr functional mobility, though not exactly sure of baseline level of function; Will benefit from acute PT to work on transfers/general mobility on a trial basis pending ability to participate and more info re: PLOF; Recommending SNF for DC (under the impression that pt is from SNF -- is this correct?)    PT Assessment  Patient needs continued PT services    Follow Up Recommendations  SNF    Does the patient have the potential to tolerate intense rehabilitation      Barriers to Discharge None      Equipment Recommendations  None recommended by PT    Recommendations for Other Services     Frequency Min 2X/week    Precautions / Restrictions Precautions Precautions: Fall Precaution Comments: Blind and HOH   Pertinent Vitals/Pain no apparent distress       Mobility  Bed Mobility Bed Mobility: Supine to Sit;Sitting - Scoot to Edge of Bed Supine to Sit: 2: Max assist;With rails Sitting - Scoot to [place] of Bed: 3: Mod assist;With rail Details for Bed Mobility Assistance: Physical assist and tactile cues required throughout task of bed mobility; Used bed pad to help square off hips at EOB; for this session, when pt is wanting to get up, +1 assist was adequate; It will be likely that when pt is less able to participate, +2 assist will be needed for safety Transfers Transfers: Squat Pivot Transfers Squat Pivot Transfers: 1: +2 Total assist Squat Pivot Transfers: Patient Percentage: 70% Details for Transfer Assistance: Squat pivot tranfer performed toward pt's Right with drop-armrest down; Hand-over hand cue to show pt where chair is; Pt followed all cues related to squat pivot  transfer Ambulation/Gait Ambulation/Gait Assistance:  (need to find out if pt was ambulatory at all at [facility])    Shoulder Instructions     Exercises     PT Diagnosis: Generalized weakness  PT Problem List: Decreased strength;Decreased activity tolerance;Decreased balance;Decreased mobility;Decreased cognition;Decreased knowledge of use of DME PT Treatment Interventions: DME instruction;Functional mobility training;Therapeutic activities;Therapeutic exercise;Balance training;Patient/family education   PT Goals Acute Rehab PT Goals PT Goal Formulation: Patient unable to participate in goal setting Time For Goal Achievement: 06/04/12 Potential to Achieve Goals: Fair Pt will go Supine/Side to Sit: with min assist PT Goal: Supine/Side to Sit - Progress: Goal set today Pt will go Sit to Supine/Side: with min assist PT Goal: Sit to Supine/Side - Progress: Goal set today Pt will go Sit to Stand: with min assist PT Goal: Sit to Stand - Progress: Goal set today Pt will go Stand to Sit: with min assist PT Goal: Stand to Sit - Progress: Goal set today Pt will Transfer Bed to Chair/Chair to Bed: with min assist PT Transfer Goal: Bed to Chair/Chair to Bed - Progress: Goal set today  Visit Information  Last PT Received On: 05/21/12 Assistance Needed: +2 (+2 helpful, pt's participation is unpredictable)    Subjective Data  Subjective: "You are sweet"; PT entered room at moment of pt's request to get up, so PT goals coincided with pt's wants and we had good participation Patient Stated Goal: wanting OOB   Prior Functioning  Home Living Lives With: Other (Comment) (Facility per chart) Available Help at Discharge: Skilled Nursing  Facility Type of Home: Skilled Nursing Facility Prior Function Level of Independence: Needs assistance Needs Assistance: Bathing;Dressing;Feeding;Grooming;Toileting;Transfers (per chart, requires assist with all ADLs) Comments: I'm under the impression pt is  non-ambulatory Communication Communication: HOH;Other (comment) (with demetia)    Cognition  Overall Cognitive Status: History of cognitive impairments - at baseline (noted dementia with behavioral disturbance) Arousal/Alertness: Awake/alert Orientation Level: Disoriented to;Place;Time;Situation Behavior During Session: Other (comment) (PT goal of OOB coincided wel with pt's goal of getting up)    Extremity/Trunk Assessment Right Upper Extremity Assessment RUE ROM/Strength/Tone: [facility] for tasks assessed Left Upper Extremity Assessment LUE ROM/Strength/Tone: WFL for tasks assessed Right Lower Extremity Assessment RLE ROM/Strength/Tone: Deficits RLE ROM/Strength/Tone Deficits: Generally weak Left Lower Extremity Assessment LLE ROM/Strength/Tone: Deficits LLE ROM/Strength/Tone Deficits: Generally weak   Balance Balance Balance Assessed: Yes Static Sitting Balance Static Sitting - Balance Support: Right upper extremity supported;Left upper extremity supported;Feet supported Static Sitting - Level of Assistance: 4: Min assist;5: Stand by assistance Static Sitting - Comment/# of Minutes: EOB approx 3-5 minutes in prep for transfer OOB; Pt seemed plaesed with sitting up  End of Session PT - End of Session Equipment Utilized During Treatment: Gait belt Activity Tolerance: Patient tolerated treatment well Patient left: in chair;with call bell/phone within reach;with chair alarm set Nurse Communication: Mobility status;Other (comment) (ideas for back to bed if pt not participating)  GP Functional Assessment Tool Used: Clinical Judgement Functional Limitation: Mobility: Walking and moving around Mobility: Walking and Moving Around Current Status (Z6109): At least 40 percent but less than 60 percent impaired, limited or restricted Mobility: Walking and Moving Around Goal Status [phone]): At least 1 percent but less than 20 percent impaired, limited or restricted   Van Clines [facility] [place], [place] [phone]  05/21/2012, 1:34 PM

## 2012-05-21 NOTE — Progress Notes (Signed)
Subjective: Jill Mckinney is well known to me. She has advanced dementia requiring 100% assist with all ADLs. She is blind and almost completely deaf. She has been cared for at home by her husband who recently died and her daughter. It is always a challenge to elicit any history. She is admitted for progressive decline, question of abdominal pain and probable UTI as an exacerbating factor.   Objective: Lab: Lab Results  Component Value Date   WBC 8.4 05/20/2012   HGB 12.4 05/20/2012   HCT 38.3 05/20/2012   MCV 92.5 05/20/2012   PLT 312 05/20/2012   BMET    Component Value Date/Time   NA 140 05/20/2012 1550   K 4.2 05/20/2012 1550   CL 100 05/20/2012 1550   CO2 32 05/20/2012 1550   GLUCOSE 106* 05/20/2012 1550   BUN 24* 05/20/2012 1550   CREATININE 0.80 05/20/2012 1550   CALCIUM 9.8 05/20/2012 1550   GFRNONAA 65* 05/20/2012 1550   GFRAA 75* 05/20/2012 1550   U/A Sp Gr 1.029 moderate LE, 7-10 WBC phpf, few bacteria.  Imaging: CXR - NAD     CT brain -cavernoma with question of recent bleed.  Scheduled Meds:   . [COMPLETED] cefTRIAXone (ROCEPHIN)  IV  1 g Intravenous Once  . cefTRIAXone (ROCEPHIN)  IV  1 g Intravenous Q24H  . diltiazem  60 mg Oral Q8H  . divalproex  125 mg Oral BID  . docusate sodium  100 mg Oral BID  . mirtazapine  15 mg Oral QHS  . OLANZapine  2.5 mg Oral BID  . [COMPLETED] sodium chloride  500 mL Intravenous Once  . sodium chloride  3 mL Intravenous Q12H   Continuous Infusions:   . sodium chloride 75 mL/hr at 05/21/12 0144  . [DISCONTINUED] sodium chloride     PRN Meds:.acetaminophen, acetaminophen, LORazepam, ondansetron (ZOFRAN) IV, ondansetron   Physical Exam: Filed Vitals:   05/21/12 0541  BP: 122/105  Pulse: 93  Temp:   Resp: 18   Elderly white woman who is not communicative or responsive to voice HEENT - C&S clear Cor- RRR, no murmur Pul - normal respirations, no increased WOB, no wheezing Abd - very active bowel sounds, a little protuberant, no  guarding or rebound, no tenderness to palpation Neuro - at her baseline, not very responsive.       Assessment/Plan: 1. UTI - mildly positive UA and normal CBC. Day 2 of Rocephin. Plan - will change to ceftin 250 mg bid  2. NS - cavernoma with question of recent hemorrhage - doubt this is of any clinical significance.  3. Cardiac - stable rhythm, BP stable Plan - D/C tele  4. Dispo - will call daughter in regard to plans: home vs SNF   Illene Regulus [facility] IM (o) [phone]; (c) [phone] Call-grp - Patsi Sears IM  Tele: [phone]  05/21/2012, 7:15 AM

## 2012-05-21 NOTE — Progress Notes (Signed)
Utilization review completed.  

## 2012-05-22 LAB — URINE CULTURE: Colony Count: >=100000

## 2012-05-22 MED ORDER — HALOPERIDOL LACTATE 5 MG/ML IJ SOLN: 1.0000 mg | Freq: Once | INTRAMUSCULAR | Status: AC

## 2012-05-22 MED ORDER — CEFUROXIME AXETIL 250 MG PO TABS: 250.0000 mg | ORAL_TABLET | Freq: Two times a day (BID) | ORAL | Status: DC

## 2012-05-22 MED ORDER — HALOPERIDOL 1 MG PO TABS
1.0000 mg | ORAL_TABLET | Freq: Once | ORAL | Status: AC
  Administered 2012-05-22: 1 mg via ORAL
  Filled 2012-05-22: qty 1

## 2012-05-22 MED ORDER — ENSURE PUDDING PO PUDG: 1.0000 | Freq: Three times a day (TID) | ORAL | Status: AC

## 2012-05-22 NOTE — Discharge Summary (Signed)
Jill Mckinney, DEVARGAS NO.:  [id number]  MEDICAL RECORD NO.:  [id number]  LOCATION:  6709                         FACILITY:  [facility]  PHYSICIAN:  Rosalyn Gess. Conner Muegge, MD  DATE OF BIRTH:  09-28-1923  DATE OF ADMISSION:  05/20/2012 DATE OF DISCHARGE:  05/22/2012                              DISCHARGE SUMMARY   ADMITTING DIAGNOSES: 1. Altered mental status. 2. Abdominal pain. 3. Possible urinary tract infection.  DISCHARGE DIAGNOSES: 1. Mental status at baseline with advanced dementia requiring 24-     hour/7 care. 2. Very mild urinary tract infection, complete outpatient oral     antibiotics. 3. Need for physical therapy and occupational therapy established. 4. Cavernoma on CT brain thought to be stable and not an acute     problem.  HISTORY OF PRESENT ILLNESS:  [name] is an 78 year old with very advanced Alzheimer's dementia, hypertension, atrial fibrillation, B12 deficiency, and persistent long-term failure to thrive.  The patient is blind.  She is very hard of hearing and has psychotic features of her dementia.  Despite these problems, she has been maintained at home, cared for by her devoted husband who recently passed away and her daughter.  The patient was in her usual state of health, when their daytime caretaker reported she complained of acute abdominal pain. Because of her abdominal pain and decreased level of alertness, difficult to established, she was brought to the emergency department. Evaluation revealed the patient have a mildly positive UA.  CT of the head revealed left cavernoma, which was thought to be stable.  The patient was admitted for antibiotic therapy and observation.  Please see the H and P for past medical history, family history, social history, and admission exam.  Please see previous epic records.  HOSPITAL COURSE: 1. UTI.  The patient was started on IV Rocephin and then was converted     to oral Ceftin.  Her urinalysis had  revealed specific gravity     1.029, moderate leukocytes, mucus was present.  She had 7-10 wbc's     per high-powered field.  Urine culture was sent off, was pending at     the time of discharge dictation with no growth to date. 2. Mental status.  The patient is very demented.  Her level of     consciousness, waxes and wanes.  She is very difficult in regards     to communication, was hard to assess for actual state.  Long     conversation with her daughter, I feel that she has had waxing and     waning levels of consciousness at home.  CT scan of the brain     revealed no acute changes.  There is no clear physical evidence of     a stroke or new central nervous system event.  It was presumed the     patient had a mild UTI that had caused the acute change in mental     status.  At the time of discharge, the patient seems to be at her     baseline.  The patient's other medical problems have remained stable.  She will continue on all of her previous home  medications.  DISCHARGE PHYSICAL EXAMINATION:  VITAL SIGNS:  Temperature was 97.5, blood pressure 157/78, heart rate 68, respirations 18, O2 sats 100%. GENERAL APPEARANCE:  Elderly woman who is awake, alert, and a little bit agitated this morning. HEENT:  She has temporal wasting.  Conjunctivae and sclerae were clear. NECK:  Supple. CHEST:  No deformities. PULMONARY:  The patient has no increased work of breathing.  She has good breath sounds with no wheezes or rales.  No rhonchi are noted. CARDIOVASCULAR:  2+ radial pulse.  Her precordium is quiet.  Her heart rate is rapid, but regular. ABDOMEN:  Soft.  She has positive bowel sounds.  No guarding or rebound to palpation. NEURO:  The patient is awake this morning.  She is phonating, but not intelligible sentences.  She is moving all extremities.  FINAL LABORATORY:  From May 21, 2012, sodium 141, potassium 3.9, chloride 106, CO2 of 25, BUN of 18, creatinine 0.58, glucose was  82. Folate was greater than 20, B12 was normal range at 931.  White count was 6000, hemoglobin 12.8 g, platelet count 300,000.  Thyroid was normal with a TSH of 1.631.  The patient had negative troponin at the time of admission.  DISPOSITION:  Discussed this at length with the patient including the possibility for long-term skilled care placement.  The patient's daughter's request that she is to return home.  The patient is a DNR. Home Health has been arranged.  The patient's daughter has made arrangements for in-home sitter, so the patient does have 24/7 care.  CONDITION:  At the time of discharge is hemodynamically stable with no active signs of infection.  PROGNOSIS:  Grim, given her age and multiple comorbidities and advanced dementia.  Given the difficulties in transport because her medical problems, the patient will not be seen in the office for followup unless needed.     Rosalyn Gess Celenia Hruska, MD     MEN/MEDQ  D:  05/22/2012  T:  05/22/2012  Job:  [id number]

## 2012-05-22 NOTE — Progress Notes (Signed)
[id number] Pt's daughter aware of left lower leg scar; she claims she picks on it and she has medicines  at home.

## 2012-05-22 NOTE — Care Management Note (Addendum)
Received referral for home health, then noted order for HHRN, HHPT and HHOT. Also noted PT recommendation for SNF. This CM then went to pt room to begin d/c planning for [facility] needs. Pt agitated and trying to get OOB, pt RN with pt and unable to leave bedside. Pt had been administered Ativan without effect. Per pt RN, pt daughter will arrive about 1pm. This CM will speak with daughter to continue planning. Pt currently requiring sitter, will discuss needs with family.  Johny Shock RN MPH Case manager [phone]     CARE MANAGEMENT NOTE 05/22/2012  Patient:  Jill Mckinney, Jill Mckinney   Account Number:  [id number]  Date Initiated:  05/22/2012  Documentation initiated by:  Alysson Geist  Subjective/Objective Assessment:   Consult ordered for [facility] needs, then order for HHRN, HHPT and HHOT.     Action/Plan:   Unable to plan with pt who is restless and  agitated, will await arrival of daughter.  05/22/2012 Met with pt and daughter, who selected [facility] for [facility] needs.   Anticipated DC Date:  05/22/2012   Anticipated DC Plan:  HOME W HOME HEALTH SERVICES         Choice offered to / List presented to:          [facility] arranged  HH-1 RN  HH-2 PT  HH-3 OT      [facility] agency  [facility]   Status of service:  Completed, signed off Medicare Important Message given?   (If response is "NO", the following Medicare IM given date fields will be blank) Date Medicare IM given:   Date Additional Medicare IM given:    Discharge Disposition:  HOME W HOME HEALTH SERVICES  Per UR Regulation:    If discussed at Long Length of Stay Meetings, dates discussed:    Comments:

## 2012-05-22 NOTE — Progress Notes (Signed)
Subjective: Appreciate PT and OT eval. Pt's daughter told me she plans on her returning home and that 24/7 care is doable.  Objective: Lab: Lab Results  Component Value Date   WBC 6.0 05/21/2012   HGB 12.8 05/21/2012   HCT 38.8 05/21/2012   MCV 91.7 05/21/2012   PLT 300 05/21/2012   BMET    Component Value Date/Time   NA 141 05/21/2012 1100   K 3.9 05/21/2012 1100   CL 106 05/21/2012 1100   CO2 25 05/21/2012 1100   GLUCOSE 82 05/21/2012 1100   BUN 18 05/21/2012 1100   CREATININE 0.58 05/21/2012 1100   CALCIUM 9.2 05/21/2012 1100   GFRNONAA 81* 05/21/2012 1100   GFRAA >90 05/21/2012 1100     Imaging:  Scheduled Meds:   . cefUROXime  250 mg Oral BID WC  . diltiazem  60 mg Oral Q8H  . divalproex  125 mg Oral BID  . docusate sodium  100 mg Oral BID  . feeding supplement  1 Container Oral TID BM  . mirtazapine  15 mg Oral QHS  . OLANZapine  2.5 mg Oral BID  . sodium chloride  3 mL Intravenous Q12H  . [DISCONTINUED] cefTRIAXone (ROCEPHIN)  IV  1 g Intravenous Q24H   Continuous Infusions:   . sodium chloride 75 mL/hr at 05/22/12 0514   PRN Meds:.acetaminophen, acetaminophen, LORazepam, ondansetron (ZOFRAN) IV, ondansetron   Physical Exam: Filed Vitals:   05/22/12 0512  BP: 157/78  Pulse: 68  Temp: 97.5 F (36.4 C)  Resp: 18        Assessment/Plan: For discharge home - daughter is aware of her need for 24/7 care.  Dictation #[id number]   Illene Regulus [place] IM (o) [phone]; (c) [phone] Call-grp - Patsi Sears IM  Tele: [phone]  05/22/2012, 7:13 AM

## 2012-05-22 NOTE — Progress Notes (Signed)
Pt has a skiin tear to the left lower leg. Wound measures 1cm long x 0.25cm wide. A foam dressing has been applied and the wound has been cleaned. Pt also has a dark spot to the lower leg for which a foam dressing has been applied.

## 2012-06-12 DIAGNOSIS — N390 Urinary tract infection, site not specified: Secondary | ICD-10-CM

## 2012-06-12 DIAGNOSIS — G309 Alzheimer's disease, unspecified: Secondary | ICD-10-CM

## 2012-06-12 DIAGNOSIS — I4891 Unspecified atrial fibrillation: Secondary | ICD-10-CM

## 2012-06-12 DIAGNOSIS — F0280 Dementia in other diseases classified elsewhere without behavioral disturbance: Secondary | ICD-10-CM

## 2012-07-12 ENCOUNTER — Other Ambulatory Visit: Payer: Self-pay | Admitting: Internal Medicine

## 2012-07-14 ENCOUNTER — Telehealth: Payer: Self-pay | Admitting: *Deleted

## 2012-07-14 DIAGNOSIS — R4689 Other symptoms and signs involving appearance and behavior: Secondary | ICD-10-CM

## 2012-07-14 DIAGNOSIS — F0281 Dementia in other diseases classified elsewhere with behavioral disturbance: Secondary | ICD-10-CM

## 2012-07-14 MED ORDER — OLANZAPINE 5 MG PO TABS: 5.0000 mg | ORAL_TABLET | Freq: Two times a day (BID) | ORAL | Status: DC

## 2012-07-14 NOTE — Telephone Encounter (Signed)
Rosalita Chessman, daughter, is concerned about the correct diagnosis for her mother. 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  Suggested to her: 1. Neuro consult for more refined diagnosis 2. Atypical antipsychotics are recommended, thus will increase zyprexa to 5 mg bid. 3. R/o acute UTI as cause of sudden decline. She will bring in a specimen.

## 2012-07-14 NOTE — Telephone Encounter (Signed)
Please read note below

## 2012-07-14 NOTE — Telephone Encounter (Signed)
RELATIVE, SUZANNE Mirabal, CALLED TO SPEAK TO DR. Debby Bud  CONCERNING CARE OF PATIENT AND HER DEMENTIA. CB#[phone].

## 2012-07-15 ENCOUNTER — Other Ambulatory Visit (INDEPENDENT_AMBULATORY_CARE_PROVIDER_SITE_OTHER): Payer: Medicare Other

## 2012-07-15 DIAGNOSIS — F919 Conduct disorder, unspecified: Secondary | ICD-10-CM

## 2012-07-15 DIAGNOSIS — R4689 Other symptoms and signs involving appearance and behavior: Secondary | ICD-10-CM

## 2012-07-15 LAB — URINALYSIS, ROUTINE W REFLEX MICROSCOPIC
Hgb urine dipstick: NEGATIVE
Urine Glucose: NEGATIVE
Urobilinogen, UA: 0.2 (ref 0.0–1.0)

## 2012-08-11 ENCOUNTER — Telehealth: Payer: Self-pay

## 2012-08-11 NOTE — Telephone Encounter (Signed)
Pharmacist called stating pt's insurance will no longer cover Zyprexa 5mg  BID #60 but will cover #30 of 10mg  QD. Daughter states medication works well at current dosing. (Tablets should not be cut in half). Please advise if new Rx is needed or authorization to insurance company - [phone]

## 2012-08-11 NOTE — Telephone Encounter (Signed)
Needs a PA

## 2012-08-20 ENCOUNTER — Telehealth: Payer: Self-pay | Admitting: *Deleted

## 2012-08-20 NOTE — Telephone Encounter (Signed)
Pharmacist from [facility] called about PA for olanzapine. He stated that they would not approve 1, 5 mg tablet twice a day, would approve 10 mg tablet 1 time a day. Please advise if this is ok.

## 2012-08-21 ENCOUNTER — Other Ambulatory Visit: Payer: Self-pay | Admitting: *Deleted

## 2012-08-21 MED ORDER — OLANZAPINE 10 MG PO TABS: 10.0000 mg | ORAL_TABLET | Freq: Every day | ORAL | Status: DC

## 2012-08-21 NOTE — Telephone Encounter (Signed)
Rx dosage change per [facility] and Dr Debby Bud.

## 2012-08-21 NOTE — Telephone Encounter (Signed)
Ok for olanzapine 10- mg once a day

## 2012-08-28 ENCOUNTER — Other Ambulatory Visit (HOSPITAL_COMMUNITY): Payer: Self-pay | Admitting: Internal Medicine

## 2012-08-29 NOTE — Telephone Encounter (Signed)
Tramadol called in to [facility] [phone]

## 2012-09-12 ENCOUNTER — Telehealth: Payer: Self-pay | Admitting: Internal Medicine

## 2012-09-12 ENCOUNTER — Other Ambulatory Visit: Payer: Self-pay | Admitting: Internal Medicine

## 2012-09-12 ENCOUNTER — Encounter: Payer: Self-pay | Admitting: Internal Medicine

## 2012-09-12 ENCOUNTER — Ambulatory Visit (INDEPENDENT_AMBULATORY_CARE_PROVIDER_SITE_OTHER)
Admission: RE | Admit: 2012-09-12 | Discharge: 2012-09-12 | Disposition: A | Discharge status: Home / Self Care | Payer: Medicare Other | Source: Ambulatory Visit | Attending: Internal Medicine | Admitting: Internal Medicine

## 2012-09-12 ENCOUNTER — Ambulatory Visit (INDEPENDENT_AMBULATORY_CARE_PROVIDER_SITE_OTHER): Payer: Medicare Other | Admitting: Internal Medicine

## 2012-09-12 VITALS — BP 112/72 | HR 130 | Temp 97.3°F

## 2012-09-12 DIAGNOSIS — R05 Cough: Secondary | ICD-10-CM

## 2012-09-12 DIAGNOSIS — R079 Chest pain, unspecified: Secondary | ICD-10-CM

## 2012-09-12 DIAGNOSIS — R000 Tachycardia, unspecified: Secondary | ICD-10-CM

## 2012-09-12 DIAGNOSIS — R0602 Shortness of breath: Secondary | ICD-10-CM

## 2012-09-12 MED ORDER — LEVOFLOXACIN 250 MG PO TABS: 250.0000 mg | ORAL_TABLET | Freq: Every day | ORAL | Status: DC

## 2012-09-12 NOTE — Telephone Encounter (Signed)
Patient Information:  Caller Name: Jill Mckinney  Phone: [phone]  Patient: Jill Mckinney, Jill Mckinney  Gender: Female  DOB: [birth year]  Age: 79 Years  PCP: Illene Regulus (Adults only)  Office Follow Up:  Does the office need to follow up with this patient?: No  Instructions For The Office: N/A   Symptoms  Reason For Call & Symptoms: wheezing, coughing and not feeling good.  Caller also reports her heart is racing.  Pt has also reports headache  Reviewed Health History In EMR: Yes  Reviewed Medications In EMR: Yes  Reviewed Allergies In EMR: Yes  Reviewed Surgeries / Procedures: Yes  Date of Onset of Symptoms: 08/29/2012  Treatments Tried: Tylenol  Treatments Tried Worked: No  Guideline(s) Used:  Breathing Difficulty  Disposition Per Guideline:   See Within 3 Days in Office  Reason For Disposition Reached:   Moderate longstanding difficulty breathing (e.g., speaks in phrases, SOB even at rest, pulse 100-120) and same as normal  Advice Given:  N/A  Patient Will Follow Care Advice:  YES  Appointment Scheduled:  09/12/2012 13:45:00 Appointment Scheduled Provider:  Nicki Reaper  (Dr Debby Bud was not available today; Caller could not make the earlier appt because she had to have time to get pt ready and arrange for transportation)

## 2012-09-12 NOTE — Progress Notes (Signed)
HPI  Pt presents to the clinic today with c/o cold symptoms x 1 weeks. The worst part is the headache and cough. The daughter reports that she has been wheezing and agitated. She denies fever. Her daughter states this is her typical behavior when she has bronchitis. Her HR is also elevated. She does have a history of atrial fibrillation but is not a candidate for anticoagulation therapy per cardiology. Review of Systems      Past Medical History  Diagnosis Date  . CATARACT EXTRACTION, HX OF 06/02/2007  . Celiac disease 06/02/2007  . CHEST WALL PAIN, ANTERIOR 11/24/2009  . Constipation 04/22/2011  . DEMENTIA, HX OF 06/02/2007  . FREQUENCY, URINARY 11/26/2008  . HEARING LOSS 06/02/2007  . Hypertension 04/24/2011  . INSOMNIA, CHRONIC 08/21/2010  . OSTEOPOROSIS 06/02/2007  . Other acquired absence of organ 06/02/2007  . ROTATOR CUFF REPAIR, HX OF 06/02/2007  . STASIS ULCER 06/02/2007  . TACHYARRHYTHMIA 06/02/2007  . Unspecified psychosis 06/02/2007  . UTI (lower urinary tract infection) 04/22/2011  . VITAMIN B12 DEFICIENCY 06/02/2007    Family History  Problem Relation Age of Onset  . Stroke Mother   . Lung cancer Father   . Breast cancer Maternal Aunt     History   Social History  . Marital Status: Married    Spouse Name: N/A    Number of Children: N/A  . Years of Education: N/A   Occupational History  . Not on file.   Social History Main Topics  . Smoking status: Never Smoker   . Smokeless tobacco: Never Used  . Alcohol Use: No  . Drug Use: Not on file  . Sexually Active: Not on file   Other Topics Concern  . Not on file   Social History Narrative   Married '48   3 daughters- '40, '55, '59   Lives with husband. Daughter moved in few months ago to help parents, to cook for her mom (celiac dz)    Allergies  Allergen Reactions  . Codeine Nausea And Vomiting  . Penicillins Rash  . Sulfa Antibiotics Rash     Constitutional: Positive headache, fatigue. Denies  fever or abrupt weight changes.  HEENT:   Denies eye redness, eye pain, pressure behind the eyes, facial pain, nasal congestion, ear pain, ringing in the ears, wax buildup, runny nose or bloody nose. Respiratory: Positive cough. Denies difficulty breathing or shortness of breath.  Cardiovascular: Denies chest pain, chest tightness, palpitations or swelling in the hands or feet.   No other specific complaints in a complete review of systems (except as listed in HPI above).  Objective:   BP 112/72  Pulse 130  Temp(Src) 97.3 F (36.3 C) (Oral)  SpO2 96% Wt Readings from Last 3 Encounters:  05/21/12 118 lb (53.524 kg)  03/20/12 121 lb (54.885 kg)  03/11/12 121 lb (54.885 kg)     General: Appears her stated age, well developed, well nourished in NAD. HEENT: Head: normal shape and size; Eyes: sclera white, no icterus, conjunctiva pink, PERRLA and EOMs intact; Ears: Tm's gray and intact, normal light reflex; Nose: mucosa pink and moist, septum midline; Throat/Mouth: + PND. Teeth present, mucosa erythematous and moist, no exudate noted, no lesions or ulcerations noted.  Neck: Mild cervical lymphadenopathy. Neck supple, trachea midline. No massses, lumps or thyromegaly present.  Cardiovascular: Tachycardic. S1,S2 noted.  No murmur, rubs or gallops noted. No JVD or BLE edema. No carotid bruits noted. Pulmonary/Chest: Increased effort and positive vesicular breath sounds. No respiratory distress.  No wheezes, rales or ronchi noted.      Assessment & Plan:   Upper Respiratory Infection, new onset with additional workup required:  Will obtain chest xray to r/o other lung etiology ECG reveals afib, right bundle branch block, HR 111 eRx for Levaquin x 7 days  RTC as needed or if symptoms persist.

## 2012-10-27 ENCOUNTER — Other Ambulatory Visit: Payer: Self-pay | Admitting: Internal Medicine

## 2013-01-07 ENCOUNTER — Telehealth: Payer: Self-pay | Admitting: Internal Medicine

## 2013-01-07 ENCOUNTER — Ambulatory Visit (INDEPENDENT_AMBULATORY_CARE_PROVIDER_SITE_OTHER): Payer: Medicare Other | Admitting: Internal Medicine

## 2013-01-07 ENCOUNTER — Encounter: Payer: Self-pay | Admitting: Internal Medicine

## 2013-01-07 VITALS — BP 108/72 | HR 122 | Temp 98.4°F

## 2013-01-07 DIAGNOSIS — R0602 Shortness of breath: Secondary | ICD-10-CM

## 2013-01-07 DIAGNOSIS — R05 Cough: Secondary | ICD-10-CM

## 2013-01-07 MED ORDER — LEVOFLOXACIN 250 MG PO TABS: 250.0000 mg | ORAL_TABLET | Freq: Every day | ORAL | Status: DC

## 2013-01-07 NOTE — Telephone Encounter (Signed)
Patient Information:  Caller Name: Darl Pikes  Phone: [phone]  Patient: Jill Mckinney, Jill Mckinney  Gender: Female  DOB: 10-06-1923  Age: 79 Years  PCP: Illene Regulus (Adults only)  Office Follow Up:  Does the office need to follow up with this patient?: No  Instructions For The Office: N/A  RN Note:  Advised to call if fever or condition worsens. 30 minute appointment scheduled due to health history.  Symptoms  Reason For Call & Symptoms: Cough with intermittent wheezing and chest pain when takes a deep breath or coughs.  Patient has dementia. Darl Pikes is not with I[facility] Drape at time of call.  Conferenced with Tosha/CNA who reports no labored breathing or current audible wheezing. Cough frequency lessened in past 2 hours. Noted diminshed hearing 01/06/13. No nasal congestion present.  Reviewed Health History In EMR: Yes  Reviewed Medications In EMR: Yes  Reviewed Allergies In EMR: Yes  Reviewed Surgeries / Procedures: Yes  Date of Onset of Symptoms: 01/05/2013  Guideline(s) Used:  Cough  Disposition Per Guideline:   See Today or Tomorrow in Office  Reason For Disposition Reached:   Patient wants to be seen  Advice Given:  Reassurance  Coughing is the way that our lungs remove irritants and mucus. It helps protect our lungs from getting pneumonia.  Coughing Spasms:  Drink warm fluids. Inhale warm mist (Reason: both relax the airway and loosen up the phlegm).  Prevent Dehydration:  Drink adequate liquids.  This will help soothe an irritated or dry throat and loosen up the phlegm.  Expected Course:   The expected course depends on what is causing the cough.  Viral bronchitis (chest cold) causes a cough that lasts 1 to 3 weeks. Sometimes you may cough up lots of phlegm (sputum, mucus). The mucus can normally be white, gray, yellow, or green.  Call Back If:  Difficulty breathing  You become worse.  Patient Will Follow Care Advice:  YES  Appointment Scheduled:  01/07/2013  15:15:00 Appointment Scheduled Provider:  Nicki Reaper

## 2013-01-07 NOTE — Progress Notes (Signed)
HPI  Pt presents to the clinic today with c/o cold symptoms x 1 weeks. The worst part is the headache and cough. The daughter reports that she has been wheezing and agitated. She denies fever. Her daughter states this is her typical behavior when she has bronchitis. She has not given her anything OTC. Her HR is also elevated. She does have a history of atrial fibrillation but is not a candidate for anticoagulation therapy per cardiology.  Review of Systems      Past Medical History  Diagnosis Date  . CATARACT EXTRACTION, HX OF 06/02/2007  . Celiac disease 06/02/2007  . CHEST WALL PAIN, ANTERIOR 11/24/2009  . Constipation 04/22/2011  . DEMENTIA, HX OF 06/02/2007  . FREQUENCY, URINARY 11/26/2008  . HEARING LOSS 06/02/2007  . Hypertension 04/24/2011  . INSOMNIA, CHRONIC 08/21/2010  . OSTEOPOROSIS 06/02/2007  . Other acquired absence of organ 06/02/2007  . ROTATOR CUFF REPAIR, HX OF 06/02/2007  . STASIS ULCER 06/02/2007  . TACHYARRHYTHMIA 06/02/2007  . Unspecified psychosis 06/02/2007  . UTI (lower urinary tract infection) 04/22/2011  . VITAMIN B12 DEFICIENCY 06/02/2007    Family History  Problem Relation Age of Onset  . Stroke Mother   . Lung cancer Father   . Breast cancer Maternal Aunt     History   Social History  . Marital Status: Married    Spouse Name: N/A    Number of Children: N/A  . Years of Education: N/A   Occupational History  . Not on file.   Social History Main Topics  . Smoking status: Never Smoker   . Smokeless tobacco: Never Used  . Alcohol Use: No  . Drug Use: Not on file  . Sexually Active: Not on file   Other Topics Concern  . Not on file   Social History Narrative   Married '48   3 daughters- '40, '55, '59   Lives with husband. Daughter moved in few months ago to help parents, to cook for her mom (celiac dz)    Allergies  Allergen Reactions  . Codeine Nausea And Vomiting  . Penicillins Rash  . Sulfa Antibiotics Rash     Constitutional:  Positive headache, fatigue. Denies fever or abrupt weight changes.  HEENT:   Denies eye redness, eye pain, pressure behind the eyes, facial pain, nasal congestion, ear pain, ringing in the ears, wax buildup, runny nose or bloody nose. Respiratory: Positive cough. Denies difficulty breathing or shortness of breath.  Cardiovascular: Denies chest pain, chest tightness, palpitations or swelling in the hands or feet.   No other specific complaints in a complete review of systems (except as listed in HPI above).  Objective:   BP 108/72  Pulse 122  Temp(Src) 98.4 F (36.9 C) (Oral)  SpO2 96% Wt Readings from Last 3 Encounters:  05/21/12 118 lb (53.524 kg)  03/20/12 121 lb (54.885 kg)  03/11/12 121 lb (54.885 kg)     General: Appears her stated age, well developed, well nourished in NAD. HEENT: Head: normal shape and size; Eyes: sclera white, no icterus, conjunctiva pink, PERRLA and EOMs intact; Ears: Tm's gray and intact, normal light reflex; Nose: mucosa pink and moist, septum midline; Throat/Mouth: + PND. Teeth present, mucosa erythematous and moist, no exudate noted, no lesions or ulcerations noted.  Neck: Mild cervical lymphadenopathy. Neck supple, trachea midline. No massses, lumps or thyromegaly present.  Cardiovascular: Tachycardic. S1,S2 noted.  No murmur, rubs or gallops noted. No JVD or BLE edema. No carotid bruits noted. Pulmonary/Chest: Increased effort  and positive vesicular breath sounds. No respiratory distress. No wheezes, rales or ronchi noted.      Assessment & Plan:   Upper Respiratory Infection, new onset with additional workup required:  Will obtain chest xray to r/o other lung etiology ECG from 08/2012 reviewed eRx for Levaquin x 7 days  RTC as needed or if symptoms persist.

## 2013-01-07 NOTE — Patient Instructions (Signed)

## 2013-01-27 IMAGING — CT CT HEAD W/O CM
2 of 5 series · 13 of 47 positions shown, 16 images · non-contrast
Comparison: 10/02/2007

CLINICAL DATA: Fall, confusion

CT HEAD WITHOUT CONTRAST,CT CERVICAL SPINE WITHOUT CONTRAST
TECHNIQUE: Contiguous axial images were obtained from the base of
the skull through the vertex without contrast.,Technique:
Multidetector CT imaging of the cervical spine was performed.
Multiplanar CT image reconstructions were also generated.

[Series 8: coronals · coronal · 0.17mm/px · 3 of 41 slices shown]
[im 14/41  brain]
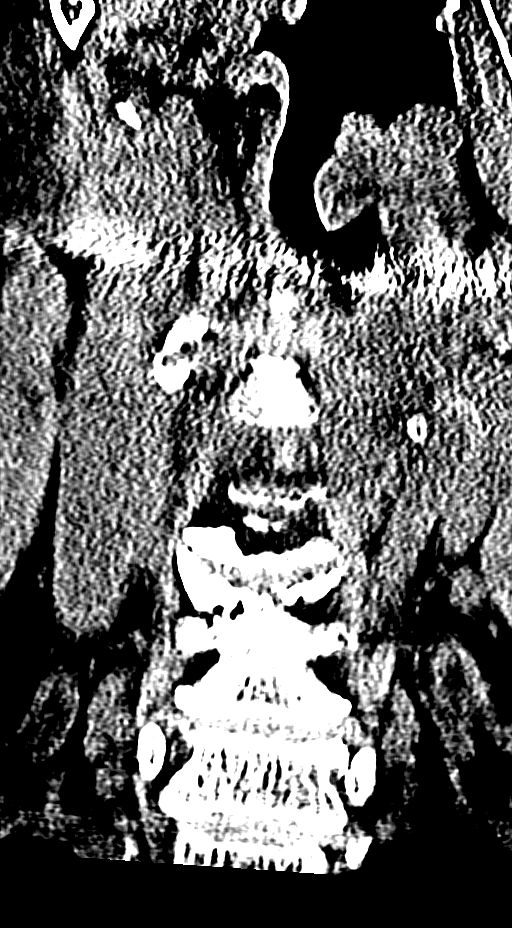
[im 18/41  brain]
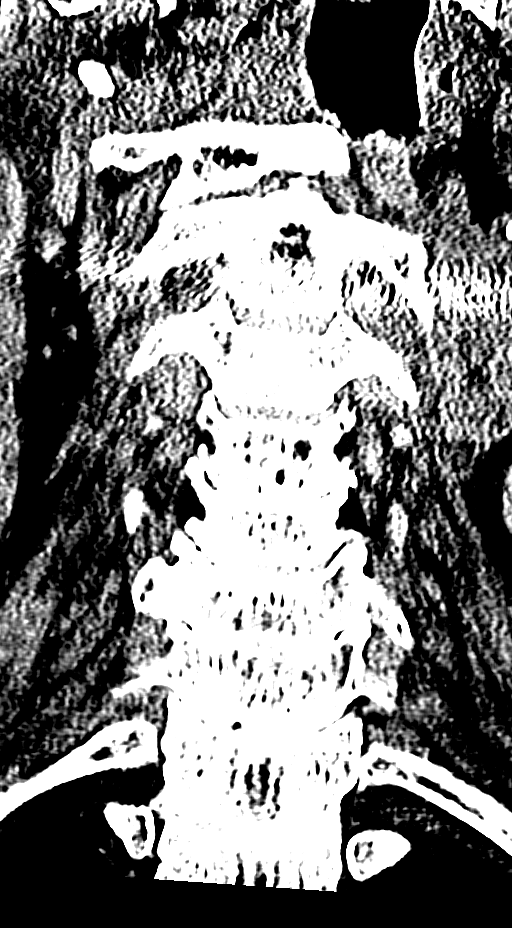
[im 23/41  brain]
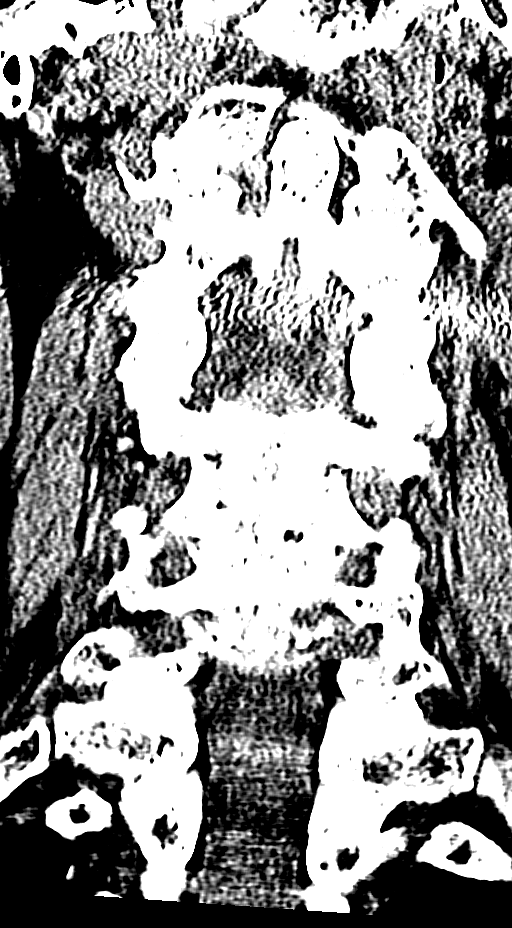

[Series 10: orthogonals · axial · 0.19mm/px · z∈[-273,-145]mm · 10 of 82 slices shown, 13 images]
[im 8/82  brain]
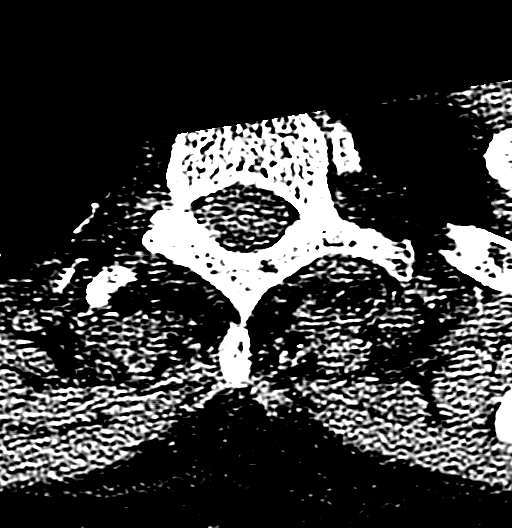
[im 8/82  bone]
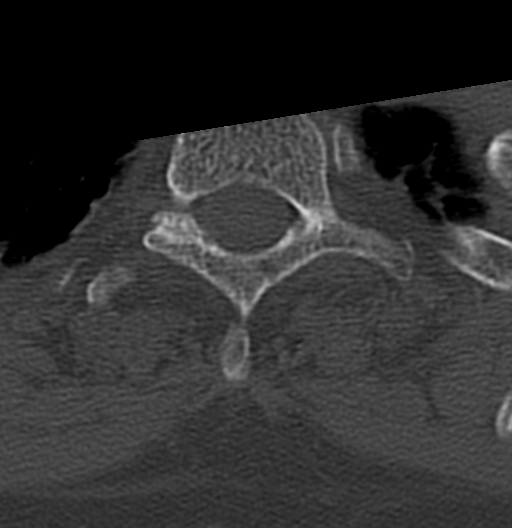
[im 15/82  brain]
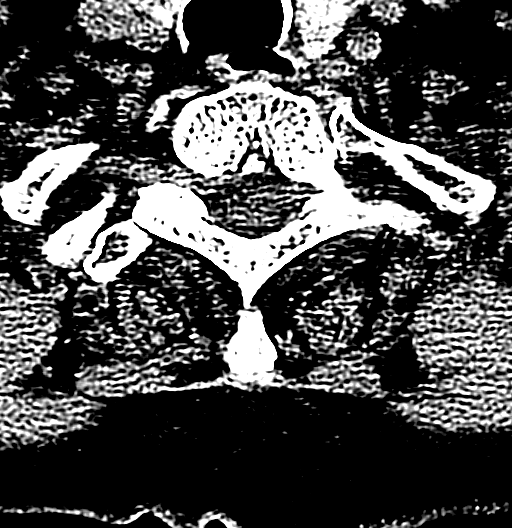
[im 23/82  brain]
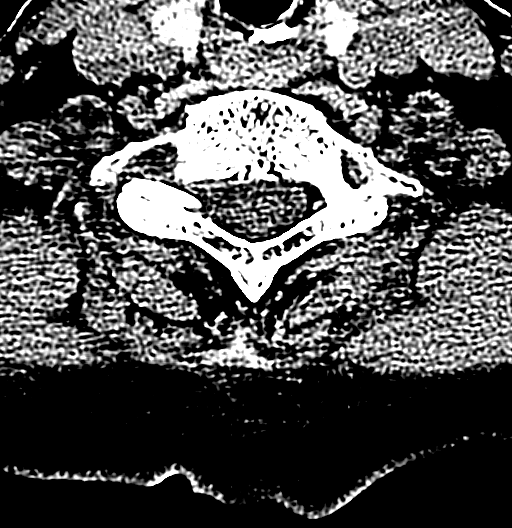
[im 30/82  brain]
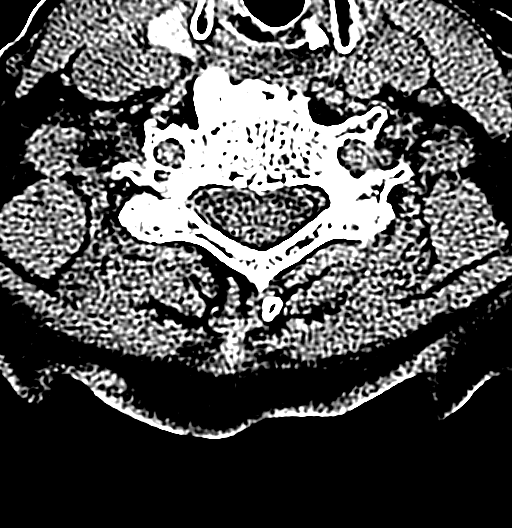
[im 37/82  brain]
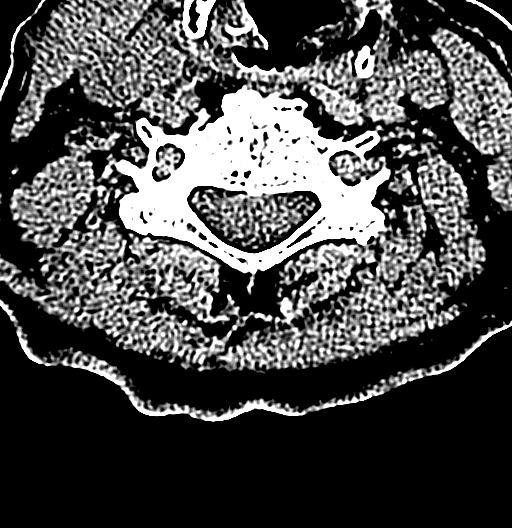
[im 37/82  bone]
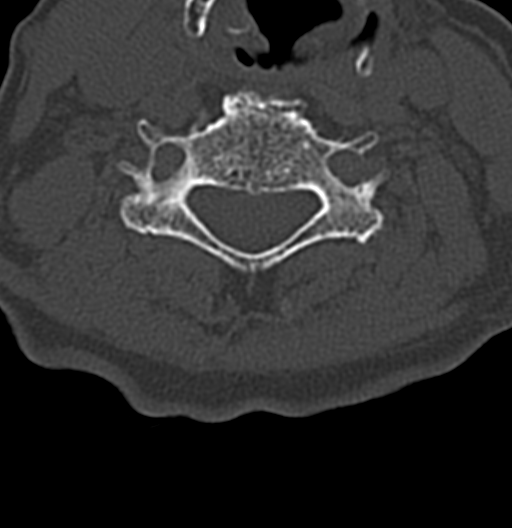
[im 45/82  brain]
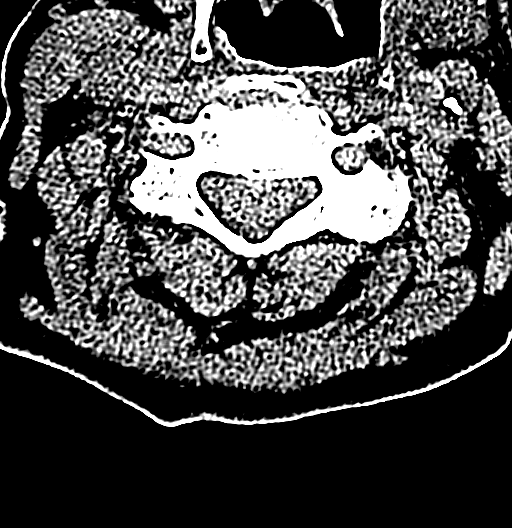
[im 52/82  brain]
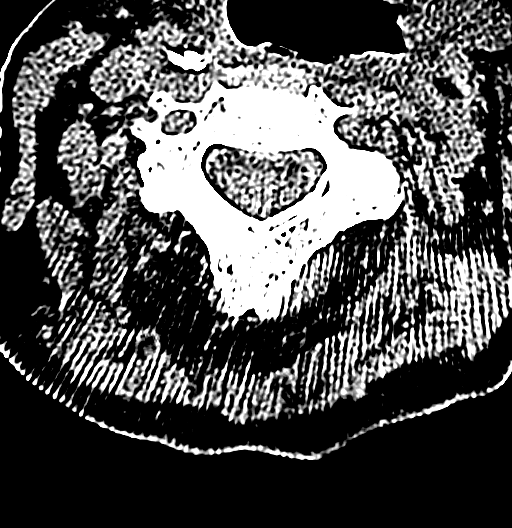
[im 59/82  brain]
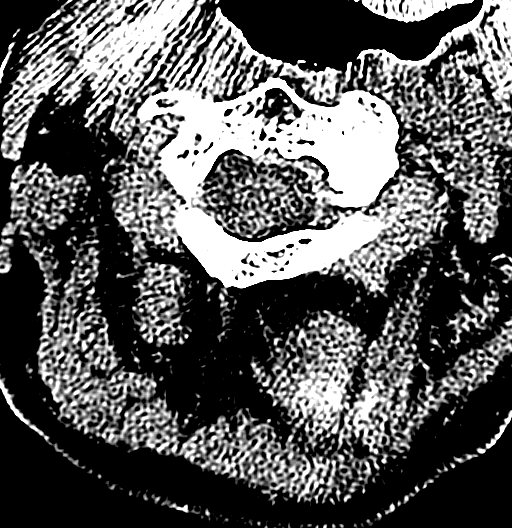
[im 67/82  brain]
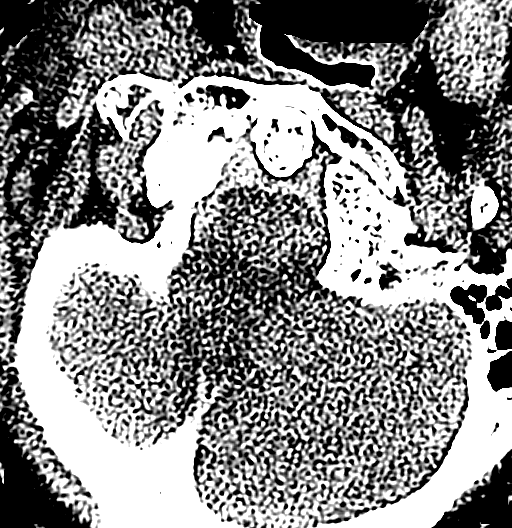
[im 67/82  bone]
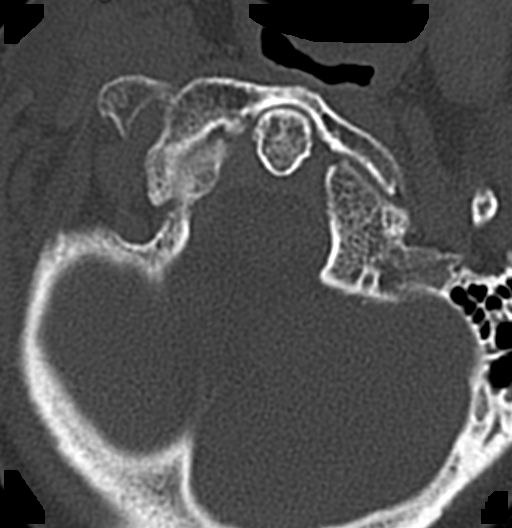
[im 74/82  brain]
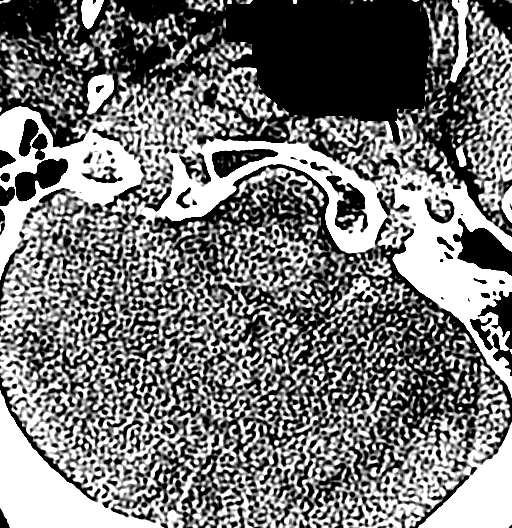

[13 of 47 positions shown; findings below may reference images not displayed]

FINDINGS: No skull fracture is noted.  Paranasal sinuses and
mastoid air cells are unremarkable.

No intracranial hemorrhage, mass effect or midline shift.

No acute infarction.  No mass lesion is noted on this unenhanced
scan.  Stable periventricular and subcortical chronic white matter
decreased attenuation consistent with white matter chronic ischemic
changes.  Stable cerebral atrophy.
IMPRESSION: No acute intracranial abnormality.  Stable atrophy and chronic
white matter disease.

CT cervical spine without IV contrast:

Axial images of the cervical spine shows no acute fracture or
subluxation.  Reversal of cervical lordosis again noted.  Stable
about 4 mm anterior subluxation C3 on C4 vertebral body.  Again
noted disc space flattening with anterior and mild posterior
spurring at C4-C5 C5-C6 and C6-C7 level.

No prevertebral soft tissue swelling.  Large anterior osteophytes
at C5-C6 level again noted.

Cervical airway is patent.
IMPRESSION: 1.  No acute fracture or subluxation.  Stable degenerative changes
as described above.  Stable about 3.7 mm anterior subluxation C3 on
C4 vertebral body.

## 2013-01-27 IMAGING — CR DG PELVIS 1-2V
1 series · 1 of 1 positions shown · non-contrast
Comparison: 02/01/2012.

CLINICAL DATA: History of hip pain.

PELVIS - 1-2 VIEW

[x pelvis]
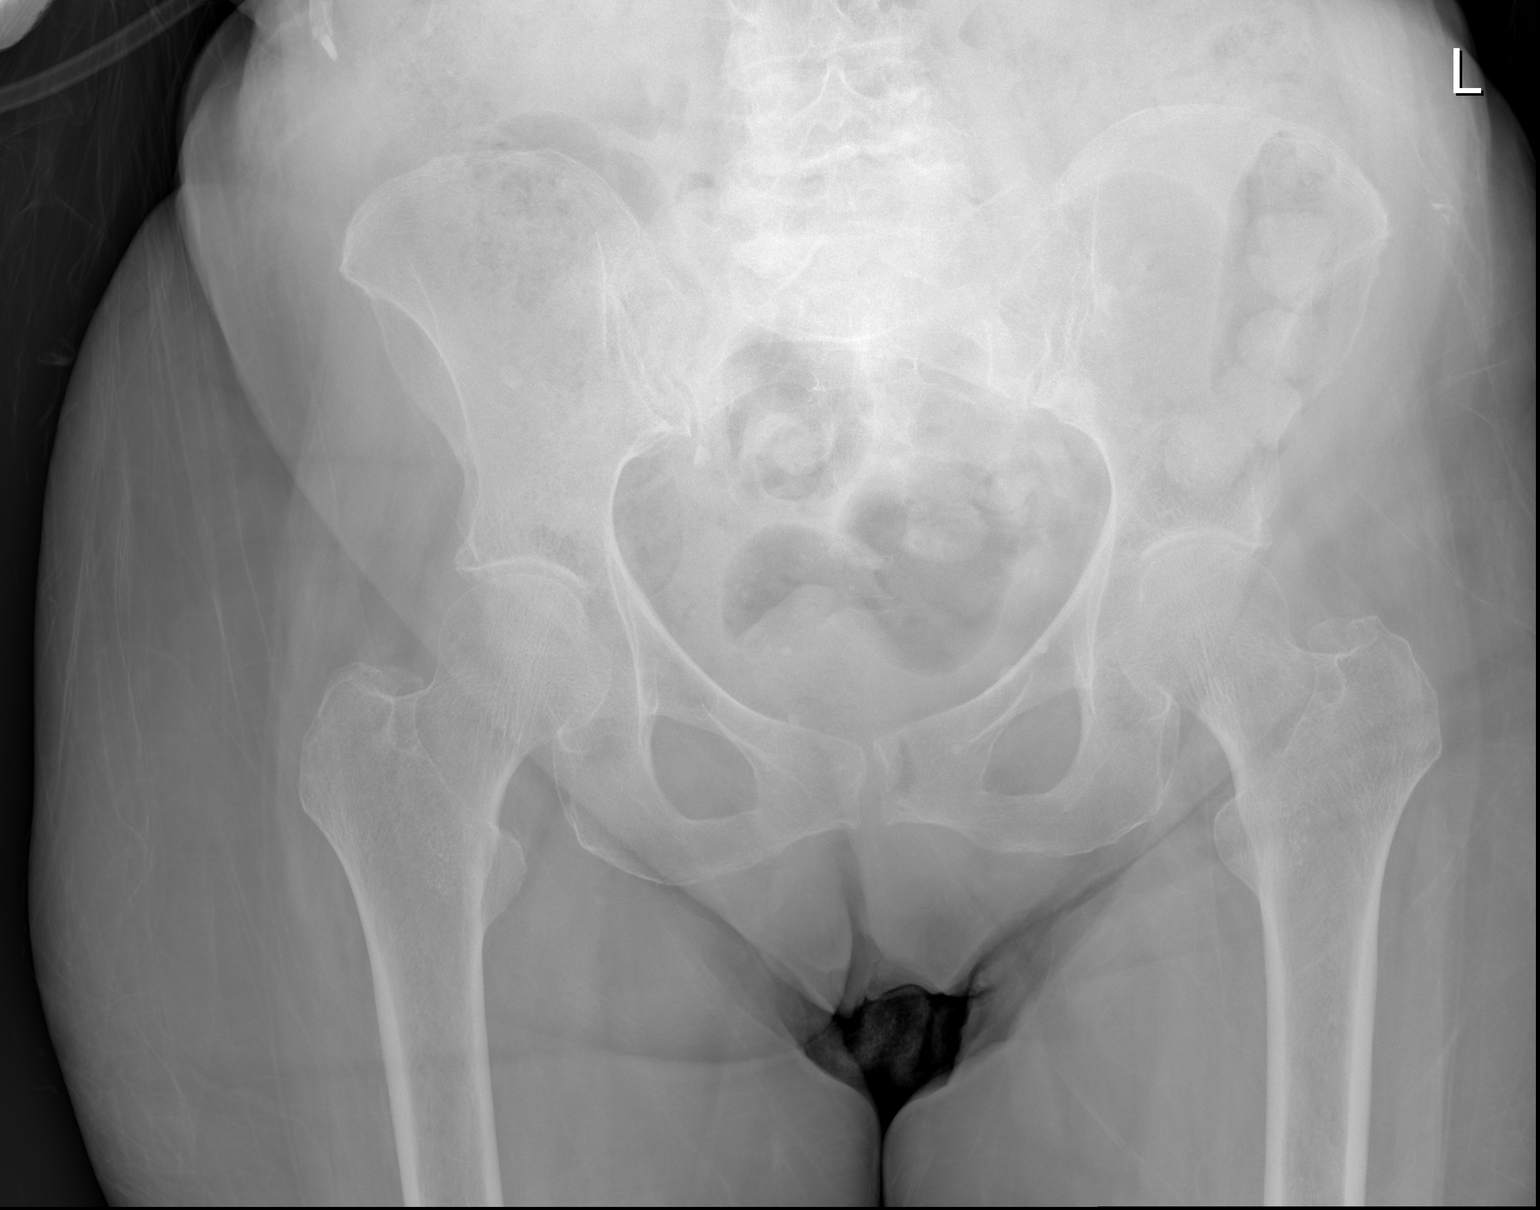

[1 of 1 positions shown; findings below may reference images not displayed]

FINDINGS: There is degenerative spondylosis.  There is osteopenic
appearance of bones.  No fracture or dislocation is identified.
Pelvic phleboliths are present. Joint spaces are preserved. Fecal
distention of portions of the colon.
IMPRESSION: No fracture or dislocation.  Osteopenic appearance of the bones.
Degenerative spondylosis.

## 2013-01-30 ENCOUNTER — Other Ambulatory Visit: Payer: Self-pay | Admitting: Internal Medicine

## 2013-03-14 ENCOUNTER — Other Ambulatory Visit: Payer: Self-pay | Admitting: Internal Medicine

## 2013-04-18 ENCOUNTER — Other Ambulatory Visit: Payer: Self-pay | Admitting: Internal Medicine

## 2013-05-19 ENCOUNTER — Telehealth: Payer: Self-pay

## 2013-05-19 NOTE — Telephone Encounter (Signed)
Phone call to patient and spoke to her daughter (I believe) letting her know she is due for a Prevnar vaccine. She was transferred to scheduling for an appt.

## 2013-05-21 ENCOUNTER — Ambulatory Visit (INDEPENDENT_AMBULATORY_CARE_PROVIDER_SITE_OTHER): Payer: Medicare Other

## 2013-05-21 DIAGNOSIS — Z23 Encounter for immunization: Secondary | ICD-10-CM

## 2013-07-01 ENCOUNTER — Other Ambulatory Visit: Payer: Self-pay | Admitting: Internal Medicine

## 2013-07-14 ENCOUNTER — Encounter: Payer: Self-pay | Admitting: Internal Medicine

## 2013-07-14 ENCOUNTER — Ambulatory Visit (INDEPENDENT_AMBULATORY_CARE_PROVIDER_SITE_OTHER): Payer: Medicare Other | Admitting: Internal Medicine

## 2013-07-14 VITALS — BP 128/80 | HR 113 | Temp 97.6°F

## 2013-07-14 DIAGNOSIS — J069 Acute upper respiratory infection, unspecified: Secondary | ICD-10-CM

## 2013-07-14 DIAGNOSIS — B9789 Other viral agents as the cause of diseases classified elsewhere: Principal | ICD-10-CM

## 2013-07-14 DIAGNOSIS — Z20828 Contact with and (suspected) exposure to other viral communicable diseases: Secondary | ICD-10-CM

## 2013-07-14 MED ORDER — OSELTAMIVIR PHOSPHATE 75 MG PO CAPS: 75.0000 mg | ORAL_CAPSULE | Freq: Every day | ORAL | Status: DC

## 2013-07-14 MED ORDER — BENZONATATE 100 MG PO CAPS: 100.0000 mg | ORAL_CAPSULE | Freq: Three times a day (TID) | ORAL | Status: DC

## 2013-07-14 NOTE — Progress Notes (Signed)
   Subjective:    Patient ID: Jill Mckinney, female    DOB: 09/18/1923, 80 y.o.   MRN: [id number]  HPI [name] is brought in by her daughters for a persistent cough. She has not had any documented fever at home. She does have sputum production. History is from the daughters because she cannot give a history. No report of N/V/D, no report of increased work of breathing.    Review of Systems System review is negative for any constitutional, cardiac, pulmonary, GI or neuro symptoms or complaints other than as described in the HPI.     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         Assessment & Plan:  1. Viral Upper respiratory infection with no evidence of bacterial infection. No need for antibiotics.  Plan - Robitussin DM 1 tsp every 4 hours as needed for cough  Hydrated  Tylenol for aches or low grade fever  Tessalon perles 100 mg three times a day.  2. Influenza exposure - high risk patient.   Plan  Tamiflu 75 mg once a day for 10 days.,

## 2013-07-14 NOTE — Progress Notes (Signed)
Pre visit review using our clinic review tool, if applicable. No additional management support is needed unless otherwise documented below in the visit note. 

## 2013-07-14 NOTE — Patient Instructions (Signed)
1. Viral Upper respiratory infection with no evidence of bacterial infection. No need for antibiotics.  Plan - Robitussin DM 1 tsp every 4 hours as needed for cough  Hydrated  Tylenol for aches or low grade fever  Tessalon perles 100 mg three times a day.  2. Influenza exposure - high risk patient.   Plan  Tamiflu 75 mg once a day for 10 days.,

## 2013-07-20 ENCOUNTER — Telehealth: Payer: Self-pay

## 2013-07-20 MED ORDER — LEVOFLOXACIN 500 MG PO TABS: 500.0000 mg | ORAL_TABLET | Freq: Every day | ORAL | Status: DC

## 2013-07-20 NOTE — Telephone Encounter (Signed)
Notified Darl PikesSusan of Levaquin and cough syrup of choice.

## 2013-07-20 NOTE — Telephone Encounter (Signed)
OK for levaquin 500 mg daily x 7 for possible respiratory infection in the frail elderly. Cough syrup of choice.

## 2013-07-20 NOTE — Telephone Encounter (Signed)
The patient's daughter called and wanted to relay that the patient is not feeling better.  She states she has a cough, and her chest is hurting more so than before.   Daughter Darl Pikes(Susan) - callback - [phone]

## 2013-08-05 ENCOUNTER — Ambulatory Visit (INDEPENDENT_AMBULATORY_CARE_PROVIDER_SITE_OTHER): Payer: Medicare Other | Admitting: Internal Medicine

## 2013-08-05 ENCOUNTER — Encounter: Payer: Self-pay | Admitting: Internal Medicine

## 2013-08-05 ENCOUNTER — Other Ambulatory Visit (INDEPENDENT_AMBULATORY_CARE_PROVIDER_SITE_OTHER): Payer: Medicare Other

## 2013-08-05 VITALS — BP 100/70 | HR 87

## 2013-08-05 DIAGNOSIS — E538 Deficiency of other specified B group vitamins: Secondary | ICD-10-CM

## 2013-08-05 DIAGNOSIS — G301 Alzheimer's disease with late onset: Secondary | ICD-10-CM

## 2013-08-05 DIAGNOSIS — F02818 Dementia in other diseases classified elsewhere, unspecified severity, with other behavioral disturbance: Secondary | ICD-10-CM

## 2013-08-05 DIAGNOSIS — Z0000 Encounter for general adult medical examination without abnormal findings: Secondary | ICD-10-CM

## 2013-08-05 DIAGNOSIS — T887XXA Unspecified adverse effect of drug or medicament, initial encounter: Secondary | ICD-10-CM

## 2013-08-05 DIAGNOSIS — I4891 Unspecified atrial fibrillation: Secondary | ICD-10-CM

## 2013-08-05 DIAGNOSIS — Z7189 Other specified counseling: Secondary | ICD-10-CM | POA: Insufficient documentation

## 2013-08-05 DIAGNOSIS — F0281 Dementia in other diseases classified elsewhere with behavioral disturbance: Secondary | ICD-10-CM

## 2013-08-05 DIAGNOSIS — N390 Urinary tract infection, site not specified: Secondary | ICD-10-CM

## 2013-08-05 DIAGNOSIS — M810 Age-related osteoporosis without current pathological fracture: Secondary | ICD-10-CM

## 2013-08-05 DIAGNOSIS — I10 Essential (primary) hypertension: Secondary | ICD-10-CM

## 2013-08-05 DIAGNOSIS — F0280 Dementia in other diseases classified elsewhere without behavioral disturbance: Secondary | ICD-10-CM

## 2013-08-05 DIAGNOSIS — G309 Alzheimer's disease, unspecified: Secondary | ICD-10-CM

## 2013-08-05 DIAGNOSIS — R627 Adult failure to thrive: Secondary | ICD-10-CM

## 2013-08-05 LAB — HEMOGLOBIN AND HEMATOCRIT, BLOOD
HEMATOCRIT: 41.5 % (ref 36.0–46.0)
Hemoglobin: 13.4 g/dL (ref 12.0–15.0)

## 2013-08-05 LAB — VITAMIN B12: VITAMIN B 12: 1383 pg/mL — AB (ref 211–911)

## 2013-08-05 NOTE — Assessment & Plan Note (Addendum)
The patient has a DNR order and is unable to participate in the discussions. Daughter are healthcare power of attorney. Will discuss with sisters. MOST discussion initiated and sample MOST form given.

## 2013-08-05 NOTE — Assessment & Plan Note (Addendum)
Well controlled on current medications.   Plan Check hemoglobin and hematocrit today.  Continue scheduled diltiazem.  Advised against prn use of diltiazem

## 2013-08-05 NOTE — Assessment & Plan Note (Addendum)
Wt Readings from Last 3 Encounters:  05/21/12 118 lb (53.524 kg)  03/20/12 121 lb (54.885 kg)  03/11/12 121 lb (54.885 kg)   Patient is eating well at this point, has gained weight per daughter but unable to be weighed due to mental status.  Plan Continue mirtazapine

## 2013-08-05 NOTE — Assessment & Plan Note (Addendum)
BP Readings from Last 3 Encounters:  08/05/13 100/70  07/14/13 128/80  01/07/13 108/72   Well controlled.  Plan Continue current medications, check BMET today

## 2013-08-05 NOTE — Assessment & Plan Note (Signed)
Patient has had fewer UTIs now that daughters are responsible for her wiping.

## 2013-08-05 NOTE — Progress Notes (Signed)
Subjective:     Patient ID: Jill Mckinney, female   DOB: September 13, 1923, 80 y.o.   MRN: [id number]  HPI Interval history remarkable for cough that was responsive to levaquin.   The patient is here for annual Medicare wellness examination and management of other chronic and acute problems.   The risk factors are reflected in the social history.  The roster of all physicians providing medical care to patient - is listed in the Snapshot section of the chart.  Activities of daily living:  The patient is 100% dependent in all ADLs: dressing, toileting, feeding. Can walk one or two steps.  Home safety : The patient has smoke detectors in the home. They wear seatbelts.  Firearms are present in the home, kept in a safe fashion. Rails on her bed. No falls in the last year.  There is no risks for hepatitis, STDs or HIV. There is no   history of blood transfusion. They have no travel history to infectious disease endemic areas of the world.  The patient has not seen their dentist in the last six month but they have an appointment scheduled. They have not seen their eye doctor in the last year. They have hearing difficulty and have not had audiologic testing in the last year.    They do not  have excessive sun exposure. Discussed the need for sun protection: hats, long sleeves and use of sunscreen if there is significant sun exposure.   Diet: the importance of a healthy diet is discussed. They do have a healthy diet. Eats fairly well two meals / day with a protein shake  The patient does not have a regular exercise program  Depression screen: there are no signs or vegative symptoms of depression- irritability, change in appetite, anhedonia, sadness/tearfullness.  Cognitive assessment: the patient is completely dependent for financial and personal affairs and is not actively engaged.   The following portions of the patient's history were reviewed and updated as appropriate: allergies, current  medications, past family history, past medical history,  past surgical history, past social history  and problem list.  Vision, hearing, body mass index were assessed and reviewed.   During the course of the visit the patient was educated and counseled about appropriate screening and preventive services including : fall prevention , diabetes screening, nutrition counseling, colorectal cancer screening, and recommended immunizations.  Past Medical History  Diagnosis Date  . CATARACT EXTRACTION, HX OF 06/02/2007  . Celiac disease 06/02/2007  . CHEST WALL PAIN, ANTERIOR 11/24/2009  . Constipation 04/22/2011  . DEMENTIA, HX OF 06/02/2007  . FREQUENCY, URINARY 11/26/2008  . HEARING LOSS 06/02/2007  . Hypertension 04/24/2011  . INSOMNIA, CHRONIC 08/21/2010  . OSTEOPOROSIS 06/02/2007  . Other acquired absence of organ 06/02/2007  . ROTATOR CUFF REPAIR, HX OF 06/02/2007  . STASIS ULCER 06/02/2007  . TACHYARRHYTHMIA 06/02/2007  . Unspecified psychosis 06/02/2007  . UTI (lower urinary tract infection) 04/22/2011  . VITAMIN B12 DEFICIENCY 06/02/2007   Past Surgical History  Procedure Laterality Date  . Cardiac electrophysiology mapping and ablation    . Hand surgery    . Rotator cuff repair    . Excision large mole removed from back & breast    . Cholecystectomy    . Tonsillectomy    . Orif ankle dislocation    . Cataract extraction     Family History  Problem Relation Age of Onset  . Stroke Mother   . Lung cancer Father   . Breast cancer Maternal  Aunt    History   Social History  . Marital Status: Married    Spouse Name: N/A    Number of Children: N/A  . Years of Education: N/A   Occupational History  . Not on file.   Social History Main Topics  . Smoking status: Never Smoker   . Smokeless tobacco: Never Used  . Alcohol Use: No  . Drug Use: Not on file  . Sexual Activity: Not on file   Other Topics Concern  . Not on file   Social History Narrative   Married '48   3  daughters- '40, '55, '59   Lives with husband. Daughter moved in few months ago to help parents, to cook for her mom (celiac dz)      Review of Systems  Constitutional:  Negative for fever, chills, activity change and unexpected weight change.  HEENT:  Positive for hearing loss, ear pain, congestion, neck stiffness and postnasal drip. Positive for sore throat and swallowing problems. Negative for dental complaints.   Eyes: Blind  Respiratory: Negative for wheezing.  Cardiovascular: Negative for chest pain or palpitations.  Gastrointestinal: No change in bowel habit. No bloating or gas. No reflux or indigestion Genitourinary: Negative for urgency, frequency, flank pain and difficulty urinating.  Musculoskeletal: Positive for back pain, Negative for myalgias, arthralgias and gait problem.  Neurological: Positive for headaches 3/wk relieved by tylenol . Negative for dizziness, tremors, weakness, Hematological: Negative for adenopathy.  Psychiatric/Behavioral: advanced dementia with psychotic features.       Objective:   Physical Exam   There were no vitals filed for this visit. Filed Vitals:   08/05/13 1323  BP: 100/70  Pulse: 87   Wt Readings from Last 3 Encounters:  05/21/12 118 lb (53.524 kg)  03/20/12 121 lb (54.885 kg)  03/11/12 121 lb (54.885 kg)   Gen'l: well nourished, well developed  woman in no distress HEENT - [place]/AT, oropharynx with native dentition in good condition, no buccal or palatal lesions, posterior pharynx clear, mucous membranes moist. Neck - supple, no thyromegaly Nodes- negative submental, cervical, supraclavicular regions Chest - no deformity, no CVAT Lungs - No rales, wheezes. No increased work of breathing. Breast - Deferred 2/2 age Cardiovascular - regular rate and rhythm,  2+ radial and PT pulses Abdomen - BS+ x 4, no guarding Pelvic - deferred 2/2 age Rectal - deferred  Extremities - no clubbing, cyanosis, edema or deformity.  Neuro - A&O x  1(oriented to person), CN II-XII normal, Cerebellar - no tremor, no rigidity. Derm - Head, neck, back, abdomen and extremities without suspicious lesions     Assessment and plan:

## 2013-08-05 NOTE — Assessment & Plan Note (Addendum)
Patient continues to be agitated exacerbated by sensory deprivation -blindness and deafness. Discussed medication management with daughter.  Plan Check valproic acid level.  Continue bid Zyprexa with PRN dosing for acute agitation  Advised to stop Prazosin for night terrors 2/2 risk of hypotension

## 2013-08-05 NOTE — Patient Instructions (Addendum)
It has been a pleasure providing medical care for you today.  1) Agitation / sleep - Change dose to 125mg  in daytime, 250mg  at nighttime - Give zyprexa on schedule (1/2 tablet two times a day) - Give an additional half a tablet for agitation -  Given the side effect of lowering blood pressure and her current state, do not use prazosin for sleep terrors.   2) Lab work today: - basic metabolic panel for kidney function and blood sugar - hemoglobin and hematocrit - for anemia - B12 level - history of B12 deficiency - Vvalproic acid level - to assure we are giving a therapeutic dose - Will send you a letter with results.  3) Advanced Care Planning - Given a sample MOST form today - discuss with family and return to fill out form and I'll sign it.

## 2013-08-05 NOTE — Progress Notes (Signed)
Pre visit review using our clinic review tool, if applicable. No additional management support is needed unless otherwise documented below in the visit note. 

## 2013-08-05 NOTE — Assessment & Plan Note (Addendum)
At her advanced age and poor condition will not screen for osteoporosis.   Plan Continue dietary calcium  Continue Vit D supplement

## 2013-08-05 NOTE — Assessment & Plan Note (Signed)
The patient has a history of B12 Deficiency  Plan Check B12 level today.

## 2013-08-05 NOTE — Assessment & Plan Note (Signed)
Chronically ill and demented woman who has not been hospitalized in the last  12+ months. Her limited exam is stable. Current with appropriate health maintenance including immunizations.

## 2013-08-06 LAB — VALPROIC ACID LEVEL: VALPROIC ACID LVL: 26.9 ug/mL — AB (ref 50.0–100.0)

## 2013-08-09 ENCOUNTER — Encounter: Payer: Self-pay | Admitting: Internal Medicine

## 2013-09-10 ENCOUNTER — Encounter (HOSPITAL_COMMUNITY): Payer: Self-pay | Admitting: Emergency Medicine

## 2013-09-10 ENCOUNTER — Emergency Department (HOSPITAL_COMMUNITY): Payer: Medicare Other

## 2013-09-10 ENCOUNTER — Inpatient Hospital Stay (HOSPITAL_COMMUNITY)
Admission: EM | Admit: 2013-09-10 | Discharge: 2013-09-16 | DRG: 871 | Disposition: A | Discharge status: Hospice | Payer: Medicare Other | Attending: Internal Medicine | Admitting: Internal Medicine

## 2013-09-10 DIAGNOSIS — J690 Pneumonitis due to inhalation of food and vomit: Secondary | ICD-10-CM

## 2013-09-10 DIAGNOSIS — R627 Adult failure to thrive: Secondary | ICD-10-CM

## 2013-09-10 DIAGNOSIS — F0280 Dementia in other diseases classified elsewhere without behavioral disturbance: Secondary | ICD-10-CM

## 2013-09-10 DIAGNOSIS — A419 Sepsis, unspecified organism: Secondary | ICD-10-CM | POA: Diagnosis present

## 2013-09-10 DIAGNOSIS — R071 Chest pain on breathing: Secondary | ICD-10-CM

## 2013-09-10 DIAGNOSIS — Z9849 Cataract extraction status, unspecified eye: Secondary | ICD-10-CM

## 2013-09-10 DIAGNOSIS — Z79899 Other long term (current) drug therapy: Secondary | ICD-10-CM

## 2013-09-10 DIAGNOSIS — Z515 Encounter for palliative care: Secondary | ICD-10-CM | POA: Diagnosis not present

## 2013-09-10 DIAGNOSIS — M25559 Pain in unspecified hip: Secondary | ICD-10-CM

## 2013-09-10 DIAGNOSIS — I4891 Unspecified atrial fibrillation: Secondary | ICD-10-CM

## 2013-09-10 DIAGNOSIS — R000 Tachycardia, unspecified: Secondary | ICD-10-CM | POA: Diagnosis present

## 2013-09-10 DIAGNOSIS — K900 Celiac disease: Secondary | ICD-10-CM

## 2013-09-10 DIAGNOSIS — N390 Urinary tract infection, site not specified: Secondary | ICD-10-CM

## 2013-09-10 DIAGNOSIS — Z0000 Encounter for general adult medical examination without abnormal findings: Secondary | ICD-10-CM

## 2013-09-10 DIAGNOSIS — Z7189 Other specified counseling: Secondary | ICD-10-CM

## 2013-09-10 DIAGNOSIS — Z7982 Long term (current) use of aspirin: Secondary | ICD-10-CM

## 2013-09-10 DIAGNOSIS — J9600 Acute respiratory failure, unspecified whether with hypoxia or hypercapnia: Secondary | ICD-10-CM

## 2013-09-10 DIAGNOSIS — Z880 Allergy status to penicillin: Secondary | ICD-10-CM

## 2013-09-10 DIAGNOSIS — G301 Alzheimer's disease with late onset: Secondary | ICD-10-CM

## 2013-09-10 DIAGNOSIS — F209 Schizophrenia, unspecified: Secondary | ICD-10-CM | POA: Diagnosis present

## 2013-09-10 DIAGNOSIS — I10 Essential (primary) hypertension: Secondary | ICD-10-CM | POA: Diagnosis present

## 2013-09-10 DIAGNOSIS — Z8659 Personal history of other mental and behavioral disorders: Secondary | ICD-10-CM

## 2013-09-10 DIAGNOSIS — G4700 Insomnia, unspecified: Secondary | ICD-10-CM

## 2013-09-10 DIAGNOSIS — Z9889 Other specified postprocedural states: Secondary | ICD-10-CM

## 2013-09-10 DIAGNOSIS — F0390 Unspecified dementia without behavioral disturbance: Secondary | ICD-10-CM | POA: Diagnosis present

## 2013-09-10 DIAGNOSIS — E538 Deficiency of other specified B group vitamins: Secondary | ICD-10-CM

## 2013-09-10 DIAGNOSIS — G309 Alzheimer's disease, unspecified: Secondary | ICD-10-CM

## 2013-09-10 DIAGNOSIS — Z66 Do not resuscitate: Secondary | ICD-10-CM | POA: Diagnosis present

## 2013-09-10 DIAGNOSIS — Q283 Other malformations of cerebral vessels: Secondary | ICD-10-CM

## 2013-09-10 DIAGNOSIS — J189 Pneumonia, unspecified organism: Secondary | ICD-10-CM

## 2013-09-10 DIAGNOSIS — F02818 Dementia in other diseases classified elsewhere, unspecified severity, with other behavioral disturbance: Secondary | ICD-10-CM

## 2013-09-10 DIAGNOSIS — R4182 Altered mental status, unspecified: Secondary | ICD-10-CM

## 2013-09-10 DIAGNOSIS — I959 Hypotension, unspecified: Secondary | ICD-10-CM

## 2013-09-10 DIAGNOSIS — M810 Age-related osteoporosis without current pathological fracture: Secondary | ICD-10-CM

## 2013-09-10 DIAGNOSIS — H9190 Unspecified hearing loss, unspecified ear: Secondary | ICD-10-CM

## 2013-09-10 DIAGNOSIS — F0281 Dementia in other diseases classified elsewhere with behavioral disturbance: Secondary | ICD-10-CM

## 2013-09-10 DIAGNOSIS — K5904 Chronic idiopathic constipation: Secondary | ICD-10-CM

## 2013-09-10 LAB — CBC WITH DIFFERENTIAL/PLATELET
Basophils Absolute: 0 10*3/uL (ref 0.0–0.1)
Basophils Relative: 0 % (ref 0–1)
EOS ABS: 0 10*3/uL (ref 0.0–0.7)
EOS PCT: 0 % (ref 0–5)
HCT: 43.2 % (ref 36.0–46.0)
HEMOGLOBIN: 14.8 g/dL (ref 12.0–15.0)
LYMPHS ABS: 1.5 10*3/uL (ref 0.7–4.0)
Lymphocytes Relative: 12 % (ref 12–46)
MCH: 31.7 pg (ref 26.0–34.0)
MCHC: 34.3 g/dL (ref 30.0–36.0)
MCV: 92.5 fL (ref 78.0–100.0)
MONO ABS: 1 10*3/uL (ref 0.1–1.0)
MONOS PCT: 8 % (ref 3–12)
NEUTROS PCT: 80 % — AB (ref 43–77)
Neutro Abs: 10.6 10*3/uL — ABNORMAL HIGH (ref 1.7–7.7)
Platelets: 219 10*3/uL (ref 150–400)
RBC: 4.67 MIL/uL (ref 3.87–5.11)
RDW: 16.4 % — ABNORMAL HIGH (ref 11.5–15.5)
WBC: 13.2 10*3/uL — ABNORMAL HIGH (ref 4.0–10.5)

## 2013-09-10 LAB — COMPREHENSIVE METABOLIC PANEL
ALK PHOS: 113 U/L (ref 39–117)
ALT: 33 U/L (ref 0–35)
AST: 45 U/L — ABNORMAL HIGH (ref 0–37)
Albumin: 3.9 g/dL (ref 3.5–5.2)
BUN: 40 mg/dL — AB (ref 6–23)
CO2: 31 mEq/L (ref 19–32)
CREATININE: 0.84 mg/dL (ref 0.50–1.10)
Calcium: 10.6 mg/dL — ABNORMAL HIGH (ref 8.4–10.5)
Chloride: 98 mEq/L (ref 96–112)
GFR calc Af Amer: 70 mL/min — ABNORMAL LOW (ref >90)
GFR calc non Af Amer: 61 mL/min — ABNORMAL LOW (ref >90)
GLUCOSE: 106 mg/dL — AB (ref 70–99)
POTASSIUM: 5.4 meq/L — AB (ref 3.7–5.3)
Sodium: 146 mEq/L (ref 137–147)
TOTAL PROTEIN: 8 g/dL (ref 6.0–8.3)
Total Bilirubin: 0.2 mg/dL — ABNORMAL LOW (ref 0.3–1.2)

## 2013-09-10 LAB — CBC
HCT: 36.4 % (ref 36.0–46.0)
Hemoglobin: 12.4 g/dL (ref 12.0–15.0)
MCH: 32 pg (ref 26.0–34.0)
MCHC: 34.1 g/dL (ref 30.0–36.0)
MCV: 93.8 fL (ref 78.0–100.0)
PLATELETS: 158 10*3/uL (ref 150–400)
RBC: 3.88 MIL/uL (ref 3.87–5.11)
RDW: 16.5 % — AB (ref 11.5–15.5)
WBC: 13.8 10*3/uL — ABNORMAL HIGH (ref 4.0–10.5)

## 2013-09-10 LAB — URINALYSIS, ROUTINE W REFLEX MICROSCOPIC
Bilirubin Urine: NEGATIVE
Glucose, UA: NEGATIVE mg/dL
Hgb urine dipstick: NEGATIVE
Ketones, ur: NEGATIVE mg/dL
Leukocytes, UA: NEGATIVE
Nitrite: NEGATIVE
PH: 8.5 — AB (ref 5.0–8.0)
Protein, ur: NEGATIVE mg/dL
SPECIFIC GRAVITY, URINE: 1.024 (ref 1.005–1.030)
Urobilinogen, UA: 0.2 mg/dL (ref 0.0–1.0)

## 2013-09-10 LAB — I-STAT CG4 LACTIC ACID, ED: Lactic Acid, Venous: 2.67 mmol/L — ABNORMAL HIGH (ref 0.5–2.2)

## 2013-09-10 LAB — HIV ANTIBODY (ROUTINE TESTING W REFLEX): HIV: NONREACTIVE

## 2013-09-10 LAB — CREATININE, SERUM
Creatinine, Ser: 0.72 mg/dL (ref 0.50–1.10)
GFR calc Af Amer: 87 mL/min — ABNORMAL LOW (ref >90)
GFR calc non Af Amer: 75 mL/min — ABNORMAL LOW (ref >90)

## 2013-09-10 LAB — PROCALCITONIN

## 2013-09-10 LAB — INFLUENZA PANEL BY PCR (TYPE A & B)
H1N1FLUPCR: NOT DETECTED
INFLBPCR: NEGATIVE
Influenza A By PCR: NEGATIVE

## 2013-09-10 LAB — STREP PNEUMONIAE URINARY ANTIGEN: Strep Pneumo Urinary Antigen: NEGATIVE

## 2013-09-10 LAB — GLUCOSE, CAPILLARY: Glucose-Capillary: 127 mg/dL — ABNORMAL HIGH (ref 70–99)

## 2013-09-10 MED ORDER — BIOTENE DRY MOUTH MT LIQD
15.0000 mL | Freq: Two times a day (BID) | OROMUCOSAL | Status: DC
  Administered 2013-09-11 – 2013-09-15 (×8): 15 mL via OROMUCOSAL

## 2013-09-10 MED ORDER — DEXTROSE 5 % IV SOLN
500.0000 mg | Freq: Once | INTRAVENOUS | Status: AC
  Administered 2013-09-10: 500 mg via INTRAVENOUS

## 2013-09-10 MED ORDER — VANCOMYCIN HCL IN DEXTROSE 750-5 MG/150ML-% IV SOLN
750.0000 mg | INTRAVENOUS | Status: DC
  Administered 2013-09-10 – 2013-09-12 (×3): 750 mg via INTRAVENOUS
  Filled 2013-09-10 (×4): qty 150

## 2013-09-10 MED ORDER — ACETAMINOPHEN 650 MG RE SUPP: 650.0000 mg | RECTAL | Status: DC | PRN

## 2013-09-10 MED ORDER — SODIUM CHLORIDE 0.9 % IV BOLUS (SEPSIS)
500.0000 mL | Freq: Once | INTRAVENOUS | Status: AC
  Administered 2013-09-10: 500 mL via INTRAVENOUS

## 2013-09-10 MED ORDER — SODIUM CHLORIDE 0.9 % IV SOLN
INTRAVENOUS | Status: AC
  Administered 2013-09-10: 13:00:00 via INTRAVENOUS

## 2013-09-10 MED ORDER — SODIUM CHLORIDE 0.9 % IV SOLN
INTRAVENOUS | Status: DC
  Administered 2013-09-10 (×2): 75 mL/h via INTRAVENOUS
  Administered 2013-09-12 (×2): via INTRAVENOUS

## 2013-09-10 MED ORDER — ENOXAPARIN SODIUM 40 MG/0.4ML ~~LOC~~ SOLN
40.0000 mg | SUBCUTANEOUS | Status: DC
  Administered 2013-09-10 – 2013-09-12 (×3): 40 mg via SUBCUTANEOUS
  Filled 2013-09-10 (×4): qty 0.4

## 2013-09-10 MED ORDER — DEXTROSE 5 % IV SOLN
1.0000 g | Freq: Three times a day (TID) | INTRAVENOUS | Status: DC
  Administered 2013-09-10 – 2013-09-13 (×9): 1 g via INTRAVENOUS
  Filled 2013-09-10 (×11): qty 1

## 2013-09-10 MED ORDER — ONDANSETRON HCL 4 MG/2ML IJ SOLN
4.0000 mg | Freq: Once | INTRAMUSCULAR | Status: AC
  Administered 2013-09-10: 4 mg via INTRAVENOUS
  Filled 2013-09-10: qty 2

## 2013-09-10 MED ORDER — AZTREONAM 2 G IJ SOLR
2.0000 g | Freq: Three times a day (TID) | INTRAMUSCULAR | Status: DC
  Filled 2013-09-10 (×2): qty 2

## 2013-09-10 MED ORDER — CHLORHEXIDINE GLUCONATE 0.12 % MT SOLN
15.0000 mL | Freq: Two times a day (BID) | OROMUCOSAL | Status: DC
  Administered 2013-09-10 – 2013-09-16 (×12): 15 mL via OROMUCOSAL
  Filled 2013-09-10 (×14): qty 15

## 2013-09-10 MED ORDER — LEVALBUTEROL HCL 0.63 MG/3ML IN NEBU
0.6300 mg | INHALATION_SOLUTION | Freq: Four times a day (QID) | RESPIRATORY_TRACT | Status: DC
  Administered 2013-09-10 – 2013-09-11 (×3): 0.63 mg via RESPIRATORY_TRACT
  Filled 2013-09-10 (×6): qty 3

## 2013-09-10 MED ORDER — DEXTROSE 5 % IV SOLN
1.0000 g | Freq: Once | INTRAVENOUS | Status: AC
  Administered 2013-09-10: 1 g via INTRAVENOUS
  Filled 2013-09-10: qty 10

## 2013-09-10 MED ORDER — IPRATROPIUM BROMIDE 0.02 % IN SOLN
0.5000 mg | Freq: Four times a day (QID) | RESPIRATORY_TRACT | Status: DC
Start: 2013-09-10 — End: 2013-09-11
  Administered 2013-09-10 – 2013-09-11 (×3): 0.5 mg via RESPIRATORY_TRACT
  Filled 2013-09-10 (×3): qty 2.5

## 2013-09-10 MED ORDER — MORPHINE SULFATE 2 MG/ML IJ SOLN
1.0000 mg | INTRAMUSCULAR | Status: DC | PRN
  Administered 2013-09-12: 2 mg via INTRAVENOUS
  Filled 2013-09-10: qty 1

## 2013-09-10 NOTE — Progress Notes (Signed)
UR completed Ferdinand CavaAndrea Schettino, RN, BSN, Case Managers 09/10/2013 2:49 PM

## 2013-09-10 NOTE — Progress Notes (Signed)
ANTIBIOTIC CONSULT NOTE - INITIAL  Pharmacy Consult for Vancomycin and Aztreonam Indication: pneumonia  Allergies  Allergen Reactions  . Codeine Nausea And Vomiting  . Penicillins Rash  . Sulfa Antibiotics Rash    Patient Measurements:   Wt Readings from Last 3 Encounters:  05/21/12 118 lb (53.524 kg)  03/20/12 121 lb (54.885 kg)  03/11/12 121 lb (54.885 kg)     Vital Signs: Temp: 96.9 F (36.1 C) (03/26 1012) Temp src: Rectal (03/26 1012) BP: 90/54 mmHg (03/26 1306) Pulse Rate: 92 (03/26 1306) Intake/Output from previous day:   Intake/Output from this shift: Total I/O In: 1300 [I.V.:1300] Out: 180 [Urine:180]  Labs:  Recent Labs  09/10/13 1000  WBC 13.2*  HGB 14.8  PLT 219  CREATININE 0.84   The CrCl is unknown because both a height and weight (above a minimum accepted value) are required for this calculation. No results found for this basename: VANCOTROUGH, VANCOPEAK, VANCORANDOM, GENTTROUGH, GENTPEAK, GENTRANDOM, TOBRATROUGH, TOBRAPEAK, TOBRARND, AMIKACINPEAK, AMIKACINTROU, AMIKACIN,  in the last 72 hours   Microbiology: No results found for this or any previous visit (from the past 720 hour(s)).  Medical History: Past Medical History  Diagnosis Date  . CATARACT EXTRACTION, HX OF 06/02/2007  . Celiac disease 06/02/2007  . CHEST WALL PAIN, ANTERIOR 11/24/2009  . Constipation 04/22/2011  . DEMENTIA, HX OF 06/02/2007  . FREQUENCY, URINARY 11/26/2008  . HEARING LOSS 06/02/2007  . Hypertension 04/24/2011  . INSOMNIA, CHRONIC 08/21/2010  . OSTEOPOROSIS 06/02/2007  . Other acquired absence of organ 06/02/2007  . ROTATOR CUFF REPAIR, HX OF 06/02/2007  . STASIS ULCER 06/02/2007  . TACHYARRHYTHMIA 06/02/2007  . Unspecified psychosis 06/02/2007  . UTI (lower urinary tract infection) 04/22/2011  . VITAMIN B12 DEFICIENCY 06/02/2007    Medications:  Anti-infectives   Start     Dose/Rate Route Frequency Ordered Stop   09/10/13 1400  aztreonam (AZACTAM) 2 g in  dextrose 5 % 50 mL IVPB     2 g 100 mL/hr over 30 Minutes Intravenous 3 times per day 09/10/13 1250 09/18/13 1359   09/10/13 1100  cefTRIAXone (ROCEPHIN) 1 g in dextrose 5 % 50 mL IVPB     1 g 100 mL/hr over 30 Minutes Intravenous  Once 09/10/13 1053 09/10/13 1210   09/10/13 1100  azithromycin (ZITHROMAX) 500 mg in dextrose 5 % 250 mL IVPB     500 mg 250 mL/hr over 60 Minutes Intravenous  Once 09/10/13 1053 09/10/13 1232     Assessment: 80 year old female with aspiration pneumonia vs. HCAP to begin empiric antibiotic therapy with Vancomycin and Aztreonam.  I estimate her CrCl to be ~30-40 ml/min.  Goal of Therapy:  Vancomycin trough level 15-20 mcg/ml  Plan:  Vancomycin 750mg  IV q24h Decrease Aztreonam to 1gm IV q8h Monitor renal function closely Follow available micro data  Estella HuskMichelle Yomar Mejorado, Pharm.D., BCPS, AAHIVP Clinical Pharmacist Phone: [phone] or [phone] 09/10/2013, 1:20 PM

## 2013-09-10 NOTE — ED Notes (Signed)
Results of lactic acid given to the primary nurse Particia NearingNikki K

## 2013-09-10 NOTE — ED Provider Notes (Signed)
CSN: [id number]     Arrival date & time 09/10/13  0940 History   First MD Initiated Contact with Patient 09/10/13 [phone]     Chief Complaint  Patient presents with  . Code Sepsis     (Consider location/radiation/quality/duration/timing/severity/associated sxs/prior Treatment) HPI..... level V caveat for dementia daughter reports patient has severe dementia, however she does gently ambulate in the house and carry on some conversation. She reports vomiting, diarrhea, altered mental status for 2 days. Possibility of aspiration last night. She is hypotensive. Severity is moderate to severe.  Past Medical History  Diagnosis Date  . CATARACT EXTRACTION, HX OF 06/02/2007  . Celiac disease 06/02/2007  . CHEST WALL PAIN, ANTERIOR 11/24/2009  . Constipation 04/22/2011  . DEMENTIA, HX OF 06/02/2007  . FREQUENCY, URINARY 11/26/2008  . HEARING LOSS 06/02/2007  . Hypertension 04/24/2011  . INSOMNIA, CHRONIC 08/21/2010  . OSTEOPOROSIS 06/02/2007  . Other acquired absence of organ 06/02/2007  . ROTATOR CUFF REPAIR, HX OF 06/02/2007  . STASIS ULCER 06/02/2007  . TACHYARRHYTHMIA 06/02/2007  . Unspecified psychosis 06/02/2007  . UTI (lower urinary tract infection) 04/22/2011  . VITAMIN B12 DEFICIENCY 06/02/2007   Past Surgical History  Procedure Laterality Date  . Cardiac electrophysiology mapping and ablation    . Hand surgery    . Rotator cuff repair    . Excision large mole removed from back & breast    . Cholecystectomy    . Tonsillectomy    . Orif ankle dislocation    . Cataract extraction     Family History  Problem Relation Age of Onset  . Stroke Mother   . Lung cancer Father   . Breast cancer Maternal Aunt    History  Substance Use Topics  . Smoking status: Never Smoker   . Smokeless tobacco: Never Used  . Alcohol Use: No   OB History   Grav Para Term Preterm Abortions TAB SAB Ect Mult Living                 Review of Systems  Unable to perform ROS: Acuity of condition       Allergies  Codeine; Penicillins; and Sulfa antibiotics  Home Medications   Current Outpatient Rx  Name  Route  Sig  Dispense  Refill  . acetaminophen (TYLENOL) 325 MG tablet   Oral   Take 650 mg by mouth every 6 (six) hours as needed. For pain/fever         . aspirin EC 81 MG tablet   Oral   Take 81 mg by mouth daily.           . B Complex-C (SUPER B COMPLEX PO)   Oral   Take 1 tablet by mouth daily.          . benzonatate (TESSALON) 100 MG capsule   Oral   Take 1 capsule (100 mg total) by mouth 3 (three) times daily.   30 capsule   1   . Calcium Citrate-Vitamin D (CALCIUM CITRATE + PO)   Oral   Take 2 tablets by mouth 2 (two) times daily.          . cholecalciferol (VITAMIN D) 1000 UNITS tablet   Oral   Take 2,000 Units by mouth daily.         Marland Kitchen diltiazem (CARDIZEM) 60 MG tablet      TAKE 1 TABLET BY MOUTH EVERY 8 HOURS   90 tablet   2   . divalproex (DEPAKOTE SPRINKLE) 125 MG  capsule   Oral   Take 125 mg by mouth 2 (two) times daily.         . feeding supplement (ENSURE) PUDG   Oral   Take 1 Container by mouth 3 (three) times daily between meals.         . folic acid (FOLVITE) 400 MCG tablet   Oral   Take 400 mcg by mouth daily.           . furosemide (LASIX) 20 MG tablet   Oral   Take 20 mg by mouth daily.         Marland Kitchen. levofloxacin (LEVAQUIN) 500 MG tablet   Oral   Take 1 tablet (500 mg total) by mouth daily.   7 tablet   0   . magnesium oxide (MAG-OX) 400 MG tablet   Oral   Take 400 mg by mouth daily.         . mirtazapine (REMERON) 15 MG tablet   Oral   Take 15 mg by mouth at bedtime.         Marland Kitchen. OLANZapine (ZYPREXA) 10 MG tablet   Oral   Take 5 mg by mouth at bedtime.         Marland Kitchen. OLANZapine (ZYPREXA) 5 MG tablet   Oral   Take 1 tablet (5 mg total) by mouth 2 (two) times daily.   60 tablet   5   . traMADol (ULTRAM) 50 MG tablet   Oral   Take 50 mg by mouth every 8 (eight) hours as needed. For pain           BP 97/51  Pulse 100  Temp(Src) 96.9 F (36.1 C) (Rectal)  Resp 17  SpO2 93% Physical Exam  Nursing note and vitals reviewed. Constitutional:  Slightly dyspneic, demented  HENT:  Head: Normocephalic and atraumatic.  Eyes: Conjunctivae and EOM are normal. Pupils are equal, round, and reactive to light.  Neck: Normal range of motion. Neck supple.  Cardiovascular: Normal rate, regular rhythm and normal heart sounds.   Pulmonary/Chest: Effort normal.  Decreased breath sounds  Abdominal: Soft. Bowel sounds are normal.  Musculoskeletal:  Unable  Neurological:  Unable  Skin: Skin is warm and dry.  Psychiatric:  Demented    ED Course  Procedures (including critical care time) Labs Review Labs Reviewed  CBC WITH DIFFERENTIAL - Abnormal; Notable for the following:    WBC 13.2 (*)    RDW 16.4 (*)    Neutrophils Relative % 80 (*)    Neutro Abs 10.6 (*)    All other components within normal limits  COMPREHENSIVE METABOLIC PANEL - Abnormal; Notable for the following:    Potassium 5.4 (*)    Glucose, Bld 106 (*)    BUN 40 (*)    Calcium 10.6 (*)    AST 45 (*)    Total Bilirubin 0.2 (*)    GFR calc non Af Amer 61 (*)    GFR calc Af Amer 70 (*)    All other components within normal limits  URINALYSIS, ROUTINE W REFLEX MICROSCOPIC - Abnormal; Notable for the following:    APPearance HAZY (*)    pH 8.5 (*)    All other components within normal limits  I-STAT CG4 LACTIC ACID, ED - Abnormal; Notable for the following:    Lactic Acid, Venous 2.67 (*)    All other components within normal limits  CULTURE, BLOOD (ROUTINE X 2)  CULTURE, BLOOD (ROUTINE X 2)  URINE CULTURE  Imaging Review Dg Chest Port 1 View  (if Code Sepsis Called)  09/10/2013   CLINICAL DATA:  Shortness of breath and cough  EXAM: PORTABLE CHEST - 1 VIEW  COMPARISON:  09/12/2012  FINDINGS: Cardiac shadow is mildly enlarged. The lungs are well aerated bilaterally. Patchy density is noted in the left mid lung  laterally may represent an early infiltrate. No other focal abnormality is seen.  IMPRESSION: Question early patchy infiltrate in the left mid lung laterally.   Electronically Signed   By: Alcide Clever M.D.   On: 09/10/2013 10:20     EKG Interpretation   Date/Time:  Thursday September 10 2013 09:40:24 EDT Ventricular Rate:  107 PR Interval:    QRS Duration: 129 QT Interval:  356 QTC Calculation: 475 R Axis:   4 Text Interpretation:  Atrial fibrillation Right bundle branch block  Confirmed by Jairus Tonne  MD, Anahis Furgeson ([id number]) on 09/10/2013 9:46:51 AM     CRITICAL CARE Performed by: Donnetta Hutching Total critical care time: 30 Critical care time was exclusive of separately billable procedures and treatinPatient is hypotensive and tachycardic.g other pisatients. Critical care was necessary to treat or prevent imminent or life-tIV fluids, IV Rocephin, IV Zithromax.hreatening deterioration. Critical care was time spent personally by me on the following activities: development of treatment plan with patient and/or surrogate as well as nursing, discussions with consultants, evaluation of patient's response to treatment, examination of patient, obtaining history from patient or surrogate, ordering and performing treatments and interventions, ordering and review of laboratory studies, ordering and review of radiographic studies, pulse oximetry and re-evaluation of patient's condition. MDM   Final diagnoses:  CAP (community acquired pneumonia)  Dementia   Patient is hypotensive, tachycardic.   CXR shows patchy infiltrate left mid lung.  IVF, IV Zithromax, IV Rocephin.  Admit to stepdown     Donnetta Hutching, MD 09/10/13 1131

## 2013-09-10 NOTE — ED Notes (Signed)
Dr. Adriana Simasook at bedside with patient and family

## 2013-09-10 NOTE — ED Notes (Signed)
Critical care team at bedside with patient and family

## 2013-09-10 NOTE — ED Notes (Signed)
Attempted report, RN unavailable.

## 2013-09-10 NOTE — H&P (Addendum)
History and Physical       Hospital Admission Note Date: 09/10/2013  Patient name: Jill HaggardWillie B Aleshire Medical record number: [id number] Date of birth: 03/16/1924 Age: 80 y.o. Gender: female  PCP: Illene RegulusMichael Norins, MD    Chief Complaint:  Short of breath worsening in the last week  HPI: Patient is 80 year old female with history of advanced dementia, hypertension who lives with her daughter was brought to the ER via EMS from home for confusion, shortness of breath, vomiting and diarrhea. History was obtained from the daughter, who does not live with the patient at the bedside. Patient unable to provide any history due to her mental status and respiratory distress. Per the daughter, she had noticed the patient to have difficulty breathing and congested for the last 1 week which has been progressively worsening. Per the daughter, it acutely worsened today. She reports that patient usually eats regular diet. She also had vomiting, diarrhea no mental status in the last 2 days. In the ER, patient was noticed to have temp of 96.9, heart rate are 113, initial BP 67/47 which improved to 120/93 with IV fluids. UA was negative for UTI. Lactic acid elevated at 2.67. Chest x-ray showed early patchy infiltrate in the left mid lung laterally.  Review of Systems:  Unable to obtain from the patient due to her mental status and respiratory distress   Past Medical History: Past Medical History  Diagnosis Date  . CATARACT EXTRACTION, HX OF 06/02/2007  . Celiac disease 06/02/2007  . CHEST WALL PAIN, ANTERIOR 11/24/2009  . Constipation 04/22/2011  . DEMENTIA, HX OF 06/02/2007  . FREQUENCY, URINARY 11/26/2008  . HEARING LOSS 06/02/2007  . Hypertension 04/24/2011  . INSOMNIA, CHRONIC 08/21/2010  . OSTEOPOROSIS 06/02/2007  . Other acquired absence of organ 06/02/2007  . ROTATOR CUFF REPAIR, HX OF 06/02/2007  . STASIS ULCER 06/02/2007  . TACHYARRHYTHMIA  06/02/2007  . Unspecified psychosis 06/02/2007  . UTI (lower urinary tract infection) 04/22/2011  . VITAMIN B12 DEFICIENCY 06/02/2007   Past Surgical History  Procedure Laterality Date  . Cardiac electrophysiology mapping and ablation    . Hand surgery    . Rotator cuff repair    . Excision large mole removed from back & breast    . Cholecystectomy    . Tonsillectomy    . Orif ankle dislocation    . Cataract extraction      Medications: Prior to Admission medications   Medication Sig Start Date End Date Taking? Authorizing Provider  acetaminophen (TYLENOL) 325 MG tablet Take 650 mg by mouth every 6 (six) hours as needed. For pain/fever    Historical Provider, MD  aspirin EC 81 MG tablet Take 81 mg by mouth daily.      Historical Provider, MD  B Complex-C (SUPER B COMPLEX PO) Take 1 tablet by mouth daily.     Historical Provider, MD  benzonatate (TESSALON) 100 MG capsule Take 1 capsule (100 mg total) by mouth 3 (three) times daily. 07/14/13   Jacques NavyMichael E Norins, MD  Calcium Citrate-Vitamin D (CALCIUM CITRATE + PO) Take 2 tablets by mouth 2 (two) times daily.     Historical Provider, MD  cholecalciferol (VITAMIN D) 1000 UNITS tablet Take 2,000 Units by mouth daily.    Historical Provider, MD  diltiazem (CARDIZEM) 60 MG tablet TAKE 1 TABLET BY MOUTH EVERY 8 HOURS 07/01/13   Jacques NavyMichael E Norins, MD  divalproex (DEPAKOTE SPRINKLE) 125 MG capsule Take 125 mg by mouth 2 (two) times daily.  Historical Provider, MD  feeding supplement (ENSURE) PUDG Take 1 Container by mouth 3 (three) times daily between meals. 05/22/12   Jacques Navy, MD  folic acid (FOLVITE) 400 MCG tablet Take 400 mcg by mouth daily.      Historical Provider, MD  furosemide (LASIX) 20 MG tablet Take 20 mg by mouth daily.    Historical Provider, MD  levofloxacin (LEVAQUIN) 500 MG tablet Take 1 tablet (500 mg total) by mouth daily. 07/20/13   Jacques Navy, MD  magnesium oxide (MAG-OX) 400 MG tablet Take 400 mg by mouth daily.     Historical Provider, MD  mirtazapine (REMERON) 15 MG tablet Take 15 mg by mouth at bedtime.    Historical Provider, MD  OLANZapine (ZYPREXA) 10 MG tablet Take 5 mg by mouth at bedtime. 08/21/12   Jacques Navy, MD  OLANZapine (ZYPREXA) 5 MG tablet Take 1 tablet (5 mg total) by mouth 2 (two) times daily. 07/14/12   Jacques Navy, MD  traMADol (ULTRAM) 50 MG tablet Take 50 mg by mouth every 8 (eight) hours as needed. For pain    Historical Provider, MD    Allergies:   Allergies  Allergen Reactions  . Codeine Nausea And Vomiting  . Penicillins Rash  . Sulfa Antibiotics Rash    Social History:  reports that she has never smoked. She has never used smokeless tobacco. She reports that she does not drink alcohol. Her drug history is not on file.  Family History: Family History  Problem Relation Age of Onset  . Stroke Mother   . Lung cancer Father   . Breast cancer Maternal Aunt     Physical Exam: Blood pressure 120/93, pulse 113, temperature 96.9 F (36.1 C), temperature source Rectal, resp. rate 20, SpO2 94.00%. General: Alert, awake, congested, frail, dyspneic HEENT: normocephalic, atraumatic, anicteric sclera, pink conjunctiva, pupils equal and reactive to light and accomodation, oropharynx clear Neck: supple, no masses or lymphadenopathy, no goiter, no bruits  Heart: Tachycardia, Regular rate and rhythm Lungs: Diffuse rhonchi bilaterally Abdomen: Soft, nontender, nondistended, positive bowel sounds, no masses. Extremities: No clubbing, cyanosis or edema with positive pedal pulses. Neuro does not follow any verbal commands Psych: Awake and congested, dementia Skin: no rashes or lesions, warm and dry   LABS on Admission:  Basic Metabolic Panel:  Recent Labs Lab 09/10/13 1000  NA 146  K 5.4*  CL 98  CO2 31  GLUCOSE 106*  BUN 40*  CREATININE 0.84  CALCIUM 10.6*   Liver Function Tests:  Recent Labs Lab 09/10/13 1000  AST 45*  ALT 33  ALKPHOS 113  BILITOT  0.2*  PROT 8.0  ALBUMIN 3.9   No results found for this basename: LIPASE, AMYLASE,  in the last 168 hours No results found for this basename: AMMONIA,  in the last 168 hours CBC:  Recent Labs Lab 09/10/13 1000  WBC 13.2*  NEUTROABS 10.6*  HGB 14.8  HCT 43.2  MCV 92.5  PLT 219   Cardiac Enzymes: No results found for this basename: CKTOTAL, CKMB, CKMBINDEX, TROPONINI,  in the last 168 hours BNP: No components found with this basename: POCBNP,  CBG: No results found for this basename: GLUCAP,  in the last 168 hours   Radiological Exams on Admission: Dg Chest Port 1 View  (if Code Sepsis Called)  09/10/2013   CLINICAL DATA:  Shortness of breath and cough  EXAM: PORTABLE CHEST - 1 VIEW  COMPARISON:  09/12/2012  FINDINGS: Cardiac shadow is mildly  enlarged. The lungs are well aerated bilaterally. Patchy density is noted in the left mid lung laterally may represent an early infiltrate. No other focal abnormality is seen.  IMPRESSION: Question early patchy infiltrate in the left mid lung laterally.   Electronically Signed   By: Alcide Clever M.D.   On: 09/10/2013 10:20    Assessment/Plan Principal Problem:   Sepsis/SIRS: Likely precipitated due to aspiration pneumonia versus HCAP (patient was on Levaquin in 07/2013), diarrhea with vomiting.  - Admit to step down, obtain pro-calcitonin, influenza PCR, follow blood cultures, urine legionella antigen, urine strep antigen, C. difficile PCR  - continue gentle hydration, BP appears to be improving - She appears to be aspirating as well, placed on vancomycin and aztreonam. Patient has penicillin allergy. -N.p.o. until swallow evaluation, suctioning when necessary    Active Problems: Hypotension with tachycardia: - Hold Cardizem, Lasix, management as #1    DEMENTIA, HX OF: Patient has advanced dementia     Aspiration pneumonia and  HCAP (healthcare-associated pneumonia) - Place on scheduled bronchodilators, deep suctioning as needed, IV  vancomycin and aztreonam  - If not improving, will benefit from palliative care consult for goals of care   DVT prophylaxis: Lovenox   CODE STATUS: I discussed in detail with the patient's daughter at the bedside, patient has a DO NOT RESUSCITATE and this was addressed previously by her PCP, Dr. Arthur Holms. They want to continue DO NOT RESUSCITATE to honor patient's wishes.   Family Communication: Admission, patients condition and plan of care including tests being ordered have been discussed with the patient's daughter who indicates understanding and agree with the plan and Code Status   Further plan will depend as patient's clinical course evolves and further radiologic and laboratory data become available.   Time Spent on Admission:  1 hour  Miata Culbreth M.D. [facility] 09/10/2013, 11:54 AM Pager: [phone]  If 7PM-7AM, please contact night-coverage [email or web address] Password [email or web address]  Addendum I was just called by RN, patient now worsening the, blood pressure again down to low 70s, O2 sats dropping down to 60s, O2 via nasal cannula bump to 6 L at this time. I talked to the patient's daughter in detail, explained the CODE STATUS again. At this time the want to change the CODE STATUS to limited and allow the vasopressors and antiarrhythmics if that will improve the situation. She also reported that patient had been complaining of back pain and hip pain, I explained to her that at this time she needs to be medically stable before she can go for a hip x-ray or back x-rays. As the patient's family want the vasopressors at this time, I have paged PCCM  for their recommendations.   Makari Sanko M.D. [facility] 09/10/2013, 1:11 PM  Pager: [phone]    Addendum Appreciate PCCM for evaluating the patient. I also discussed with patient's 3 daughters at the bedside after PCCM evaluation, they are in agreement for complete DO NOT RESUSCITATE status, NCB, no vasopressors or  antiarrhythmics. For now they do want to continue IV fluids and antibiotics and comfort feeds when patient is awake. When necessary morphine if any respiratory distress. They  requested comfort care if she worsens. Will place palliative consult.    Koriana Stepien M.D. [facility] 09/10/2013, 2:51 PM  Pager: [phone]

## 2013-09-10 NOTE — ED Notes (Addendum)
Dr. Isidoro Donningai aware of several low blood pressures in the last hour. Remains on [place] at 6L for comfort new orders received to start 500cc bolus. Critical care contacted by Dr. Isidoro Donningai

## 2013-09-10 NOTE — Consult Note (Signed)
PULMONARY / CRITICAL CARE MEDICINE  Name: Jill Mckinney MRN: [id number] DOB: Feb 08, 1924    ADMISSION DATE:  09/10/2013 CONSULTATION DATE:  09/10/2013  REFERRING MD :  Isidoro Donning PRIMARY SERVICE:  [facility]  CHIEF COMPLAINT:  Sepsis   HPI: 80 yo female with hx advanced dementia r/t schizophrenia, HTN, cared for by her daughter. Baseline is able to eat with assistance but requires complete assist with all other ADL's, blind, limited speech.  Presented 3/26 with SOB, vomiting, diarrhea x 1 week that acutely worsened today.  Significant hypotension in ER with SBP 60's, improved with fluids.  Pt previously DNR but reversed and wanted pressors, antiarrhythmics and PCCM consulted for ICU admit.   SIGNIFICANT EVENTS / STUDIES:   LINES / TUBES:  CULTURES: BCx 2 3/26 >>> Urine 3/26 >>>  ANTIBIOTICS: Vanc 3/26 >>> Aztreonam 3/26 >>>  PAST MEDICAL HISTORY :  Past Medical History  Diagnosis Date  . CATARACT EXTRACTION, HX OF 06/02/2007  . Celiac disease 06/02/2007  . CHEST WALL PAIN, ANTERIOR 11/24/2009  . Constipation 04/22/2011  . DEMENTIA, HX OF 06/02/2007  . FREQUENCY, URINARY 11/26/2008  . HEARING LOSS 06/02/2007  . Hypertension 04/24/2011  . INSOMNIA, CHRONIC 08/21/2010  . OSTEOPOROSIS 06/02/2007  . Other acquired absence of organ 06/02/2007  . ROTATOR CUFF REPAIR, HX OF 06/02/2007  . STASIS ULCER 06/02/2007  . TACHYARRHYTHMIA 06/02/2007  . Unspecified psychosis 06/02/2007  . UTI (lower urinary tract infection) 04/22/2011  . VITAMIN B12 DEFICIENCY 06/02/2007   Past Surgical History  Procedure Laterality Date  . Cardiac electrophysiology mapping and ablation    . Hand surgery    . Rotator cuff repair    . Excision large mole removed from back & breast    . Cholecystectomy    . Tonsillectomy    . Orif ankle dislocation    . Cataract extraction     Prior to Admission medications   Medication Sig Start Date End Date Taking? Authorizing Provider  acetaminophen (TYLENOL) 325 MG tablet Take  650 mg by mouth every 6 (six) hours as needed. For pain/fever   Yes Historical Provider, MD  aspirin EC 81 MG tablet Take 81 mg by mouth daily.     Yes Historical Provider, MD  Calcium Citrate-Vitamin D (CALCIUM CITRATE + PO) Take 2 tablets by mouth 2 (two) times daily.    Yes Historical Provider, MD  Cholecalciferol (VITAMIN D-3 PO) Take 1 tablet by mouth daily.   Yes Historical Provider, MD  diltiazem (CARDIZEM) 60 MG tablet Take 60 mg by mouth every 8 (eight) hours.   Yes Historical Provider, MD  divalproex (DEPAKOTE SPRINKLE) 125 MG capsule Take 125-250 mg by mouth 2 (two) times daily. May take 2 capsules at bedtime if agitated   Yes Historical Provider, MD  feeding supplement (ENSURE) PUDG Take 1 Container by mouth 3 (three) times daily between meals. 05/22/12  Yes Jacques Navy, MD  folic acid (FOLVITE) 400 MCG tablet Take 400 mcg by mouth daily.     Yes Historical Provider, MD  furosemide (LASIX) 20 MG tablet Take 20 mg by mouth daily.   Yes Historical Provider, MD  magnesium oxide (MAG-OX) 400 MG tablet Take 400 mg by mouth daily.   Yes Historical Provider, MD  mirtazapine (REMERON) 15 MG tablet Take 15 mg by mouth at bedtime.   Yes Historical Provider, MD  OLANZapine (ZYPREXA) 10 MG tablet Take 5 mg by mouth at bedtime. 08/21/12  Yes Jacques Navy, MD  polyethylene glycol (MIRALAX / Ethelene Hal)  packet Take 17 g by mouth daily as needed for mild constipation.   Yes Historical Provider, MD  traMADol (ULTRAM) 50 MG tablet Take 50 mg by mouth every 8 (eight) hours as needed. For pain   Yes Historical Provider, MD   Allergies  Allergen Reactions  . Codeine Nausea And Vomiting  . Penicillins Rash  . Sulfa Antibiotics Rash   FAMILY HISTORY:  Family History  Problem Relation Age of Onset  . Stroke Mother   . Lung cancer Father   . Breast cancer Maternal Aunt    SOCIAL HISTORY:  reports that she has never smoked. She has never used smokeless tobacco. She reports that she does not drink  alcohol. Her drug history is not on file.  REVIEW OF SYSTEMS:   Per HPI   SUBJECTIVE:   VITAL SIGNS: Temp:  [96.9 F (36.1 C)] 96.9 F (36.1 C) (03/26 1012) Pulse Rate:  [85-113] 106 (03/26 1400) Resp:  [15-24] 24 (03/26 1400) BP: (67-120)/(21-93) 91/25 mmHg (03/26 1400) SpO2:  [89 %-96 %] 90 % (03/26 1400)  PHYSICAL EXAMINATION: General:  Thin, frail, chronically ill appearing female, acutely ill  Neuro:  Awake, opens eyes, does not track, does not follow commands  HEENT:  Mm dry, no JVD Cardiovascular:  s1s1 tachy Lungs:  resps even mildly labored on Smiley Abdomen:  Mildly distended, non tender, hypoactive bs  Musculoskeletal:  Warm and dry, no edema   LABS: CBC  Recent Labs Lab 09/10/13 1000 09/10/13 1251  WBC 13.2* 13.8*  HGB 14.8 12.4  HCT 43.2 36.4  PLT 219 158   Coag's No results found for this basename: APTT, INR,  in the last 168 hours BMET  Recent Labs Lab 09/10/13 1000 09/10/13 1251  NA 146  --   K 5.4*  --   CL 98  --   CO2 31  --   BUN 40*  --   CREATININE 0.84 0.72  GLUCOSE 106*  --    Electrolytes  Recent Labs Lab 09/10/13 1000  CALCIUM 10.6*   Sepsis Markers  Recent Labs Lab 09/10/13 1000 09/10/13 1020  LATICACIDVEN  --  2.67*  PROCALCITON <0.10  --    ABG No results found for this basename: PHART, PCO2ART, PO2ART,  in the last 168 hours Liver Enzymes  Recent Labs Lab 09/10/13 1000  AST 45*  ALT 33  ALKPHOS 113  BILITOT 0.2*  ALBUMIN 3.9   Cardiac Enzymes No results found for this basename: TROPONINI, PROBNP,  in the last 168 hours Glucose No results found for this basename: GLUCAP,  in the last 168 hours  IMAGING: Dg Chest Port 1 View  (if Code Sepsis Called)  09/10/2013   CLINICAL DATA:  Shortness of breath and cough  EXAM: PORTABLE CHEST - 1 VIEW  COMPARISON:  09/12/2012  FINDINGS: Cardiac shadow is mildly enlarged. The lungs are well aerated bilaterally. Patchy density is noted in the left mid lung laterally may  represent an early infiltrate. No other focal abnormality is seen.  IMPRESSION: Question early patchy infiltrate in the left mid lung laterally.   Electronically Signed   By: Alcide Clever M.D.   On: 09/10/2013 10:20    ASSESSMENT / PLAN:  Aspiration Acute respiratory failure Sepsis, likely secondary to pneumonia, ? abdominal source  Dementia - poor poor quality of life at baseline  DNR/DNI  Discussed at length (>45 min) with 3 daughters and son in law.  They certainly want their mother to get better, but realize  she has a very poor quality of life at baseline and that even the best case scenario this admit will not change that. They do not want her to undergo any aggressive interventions.  Their main goal is comfort but would like to continue with aggressive IV fluids and antibiotics.  NO pressors, no antiarrhythmics, DNR/DNI.    Admit to med-surg floor  Continue IV fluids  Consider widening abx to cover anaerobes CDiff pending  NPO - consider comfort feeds when more awake  F/u culture data  DNR/DNI - no pressors, no CVL, no intubation PRN morphine  If declines, would transition to full comfort care  [facility]   PCCM signing off please call back if needed   [facility] Re[facility] HospitalWHITEHEART,KATHRYN, NP 09/10/2013  2:07 PM Pager: [phone] or [phone]) [phone]  I have personally obtained history, examined patient, evaluated and interpreted laboratory and imaging results, reviewed medical records, formulated assessment / plan and placed orders.  CRITICAL CARE:  The patient is critically ill with multiple organ systems failure and requires high complexity decision making for assessment and support, frequent evaluation and titration of therapies, application of advanced monitoring technologies and extensive interpretation of multiple databases. Critical Care Time devoted to patient care services described in this note is 35 minutes.   Lonia FarberZUBELEVITSKIY, Fabrizio Filip,  MD Pulmonary and Critical Care Medicine [facility] Pager: [phone]  09/10/2013, 3:48 PM

## 2013-09-10 NOTE — ED Notes (Signed)
Pt arrives via EMS from home with altered mental status for the last several days per caregiver. This AM with vomiting and diarrhea. Pt with history of dementia. Full code. 20g lFA. CBG 112.

## 2013-09-10 NOTE — ED Notes (Signed)
Hospitalist at bedside 

## 2013-09-11 LAB — CBC
HCT: 34.6 % — ABNORMAL LOW (ref 36.0–46.0)
Hemoglobin: 11.4 g/dL — ABNORMAL LOW (ref 12.0–15.0)
MCH: 31.1 pg (ref 26.0–34.0)
MCHC: 32.9 g/dL (ref 30.0–36.0)
MCV: 94.3 fL (ref 78.0–100.0)
Platelets: 146 10*3/uL — ABNORMAL LOW (ref 150–400)
RBC: 3.67 MIL/uL — AB (ref 3.87–5.11)
RDW: 16.9 % — ABNORMAL HIGH (ref 11.5–15.5)
WBC: 15.4 10*3/uL — ABNORMAL HIGH (ref 4.0–10.5)

## 2013-09-11 LAB — LEGIONELLA ANTIGEN, URINE: LEGIONELLA ANTIGEN, URINE: NEGATIVE

## 2013-09-11 LAB — CLOSTRIDIUM DIFFICILE BY PCR: CDIFFPCR: NEGATIVE

## 2013-09-11 LAB — URINE CULTURE
Colony Count: NO GROWTH
Culture: NO GROWTH

## 2013-09-11 LAB — BASIC METABOLIC PANEL
BUN: 29 mg/dL — AB (ref 6–23)
CALCIUM: 9.3 mg/dL (ref 8.4–10.5)
CO2: 29 meq/L (ref 19–32)
Chloride: 104 mEq/L (ref 96–112)
Creatinine, Ser: 0.8 mg/dL (ref 0.50–1.10)
GFR calc Af Amer: 75 mL/min — ABNORMAL LOW (ref >90)
GFR calc non Af Amer: 64 mL/min — ABNORMAL LOW (ref >90)
Glucose, Bld: 82 mg/dL (ref 70–99)
Potassium: 4.2 mEq/L (ref 3.7–5.3)
SODIUM: 145 meq/L (ref 137–147)

## 2013-09-11 MED ORDER — LEVALBUTEROL HCL 0.63 MG/3ML IN NEBU
0.6300 mg | INHALATION_SOLUTION | Freq: Two times a day (BID) | RESPIRATORY_TRACT | Status: DC
  Administered 2013-09-11 – 2013-09-13 (×5): 0.63 mg via RESPIRATORY_TRACT
  Filled 2013-09-11 (×14): qty 3

## 2013-09-11 MED ORDER — IPRATROPIUM BROMIDE 0.02 % IN SOLN
0.5000 mg | Freq: Two times a day (BID) | RESPIRATORY_TRACT | Status: DC
  Administered 2013-09-11 – 2013-09-13 (×5): 0.5 mg via RESPIRATORY_TRACT
  Filled 2013-09-11 (×7): qty 2.5

## 2013-09-11 MED ORDER — CLINDAMYCIN PHOSPHATE 600 MG/50ML IV SOLN
600.0000 mg | Freq: Three times a day (TID) | INTRAVENOUS | Status: DC
  Administered 2013-09-11 – 2013-09-13 (×6): 600 mg via INTRAVENOUS
  Filled 2013-09-11 (×8): qty 50

## 2013-09-11 NOTE — Progress Notes (Signed)
I agree with the Student-Dietitian note and made appropriate revisions.  Katie Dorothee Napierkowski, RD, LDN Pager #: [phone] After-Hours Pager #: [phone]  

## 2013-09-11 NOTE — Evaluation (Signed)
Clinical/Bedside Swallow Evaluation Patient Details  Name: Jill HaggardWillie B Corpening MRN: [id number] Date of Birth: 10/18/1923  Today's Date: 09/11/2013 Time: 0820-0854 SLP Time Calculation (min): 34 min  Past Medical History:  Past Medical History  Diagnosis Date  . CATARACT EXTRACTION, HX OF 06/02/2007  . Celiac disease 06/02/2007  . CHEST WALL PAIN, ANTERIOR 11/24/2009  . Constipation 04/22/2011  . DEMENTIA, HX OF 06/02/2007  . FREQUENCY, URINARY 11/26/2008  . HEARING LOSS 06/02/2007  . Hypertension 04/24/2011  . INSOMNIA, CHRONIC 08/21/2010  . OSTEOPOROSIS 06/02/2007  . Other acquired absence of organ 06/02/2007  . ROTATOR CUFF REPAIR, HX OF 06/02/2007  . STASIS ULCER 06/02/2007  . TACHYARRHYTHMIA 06/02/2007  . Unspecified psychosis 06/02/2007  . UTI (lower urinary tract infection) 04/22/2011  . VITAMIN B12 DEFICIENCY 06/02/2007   Past Surgical History:  Past Surgical History  Procedure Laterality Date  . Cardiac electrophysiology mapping and ablation    . Hand surgery    . Rotator cuff repair    . Excision large mole removed from back & breast    . Cholecystectomy    . Tonsillectomy    . Orif ankle dislocation    . Cataract extraction     HPI:  80 yo female with hx advanced dementia r/t schizophrenia, HTN, cared for by her daughter. Baseline is able to eat with assistance but requires complete assist with all other ADL's, blind, limited speech. Presented 3/26 with SOB, vomiting, diarrhea x 1 week that acutely worsened today. Significant hypotension in ER with SBP 60's, improved with fluids. Pt previously DNR but reversed and wanted pressors, antiarrhythmics and PCCM consulted for ICU admit. MD reports family does not want aggressive interventions, main goal is comfort but will continue IV antibiotics and fluids. POs for comfort will be considered with awake. Will transfer to full comfort with any decline. CXR shows right basilar infiltrate.    Assessment / Plan /  Recommendation Clinical Impression  Pt is awake but severely confused. Oral care performed and excessive secretions suctioned from mouth and oropharynx. Pt observed to have difficulty breathing prior to suction. Pt did respond to trial of puree, closing lips on spoon, but oral manipulation incomplete, swallow not triggered despite max cues and most of bolus suctioned from mouth. Pt is not appropriate for PO until mentation improves somewhat for increased awareness of and attention to intake. Likely pt will be at high aspiration risk even of secretions at baseline. Any POs given will need to be with known risk of aspiraiton.     Aspiration Risk  Severe    Diet Recommendation NPO   Medication Administration: Via alternative means    Other  Recommendations Oral Care Recommendations: Oral care Q4 per protocol   Follow Up Recommendations       Frequency and Duration min 2x/week  1 week   Pertinent Vitals/Pain NA    SLP Swallow Goals     Swallow Study Prior Functional Status       General HPI: 80 yo female with hx advanced dementia r/t schizophrenia, HTN, cared for by her daughter. Baseline is able to eat with assistance but requires complete assist with all other ADL's, blind, limited speech. Presented 3/26 with SOB, vomiting, diarrhea x 1 week that acutely worsened today. Significant hypotension in ER with SBP 60's, improved with fluids. Pt previously DNR but reversed and wanted pressors, antiarrhythmics and PCCM consulted for ICU admit. MD reports family does not want aggressive interventions, main goal is comfort but will continue IV antibiotics and  fluids. POs for comfort will be considered with awake. Will transfer to full comfort with any decline. CXR shows right basilar infiltrate.  Type of Study: Bedside swallow evaluation Diet Prior to this Study: NPO Temperature Spikes Noted: No Respiratory Status: Nasal cannula (but doesnt keep it on) History of Recent Intubation:  No Behavior/Cognition: Alert;Uncooperative;Confused;Doesn't follow directions;Hard of hearing Oral Cavity - Dentition: Dentures, top;Missing dentition Self-Feeding Abilities: Total assist Patient Positioning: Upright in bed Baseline Vocal Quality: Low vocal intensity Volitional Cough: Cognitively unable to elicit Volitional Swallow: Unable to elicit    Oral/Motor/Sensory Function Overall Oral Motor/Sensory Function: Other (comment) (doesnt follow commands generalized weakness)   Ice Chips     Thin Liquid Thin Liquid:  (pt unaware of attempts for thin liquids, not given)    Nectar Thick Nectar Thick Liquid: Not tested   Honey Thick Honey Thick Liquid: Not tested   Puree Puree: Impaired Presentation: Spoon Oral Phase Impairments: Reduced labial seal;Reduced lingual movement/coordination;Impaired anterior to posterior transit;Poor awareness of bolus Oral Phase Functional Implications: Left anterior spillage;Left lateral sulci pocketing;Prolonged oral transit;Oral residue Pharyngeal Phase Impairments: Unable to trigger swallow;Wet Vocal Quality;Cough - Delayed   Solid   GO    Solid: Not tested       Corri Delapaz, Riley Nearing 09/11/2013,8:59 AM

## 2013-09-11 NOTE — Progress Notes (Signed)
[facility] PROGRESS NOTE  Jill Mckinney VWU:[id number] DOB: May 04, 1924 DOA: 09/10/2013 PCP: Illene Regulus, MD  Assessment/Plan: 1. Sepsis/SIRS- Likely HCAP/aspiration pneumonia, started on vancomycin and aztreonam, will also add Clindamycin for anaerobic coverage. Patient is not responding to the treatment, if not much improvement, will transition to comfort care. 2. Dementia- Has been unresponsive, no agitation. 3. Goals of care discussion- Confirmed the goals of care with the daughter and son in law, no aggressive interventions if she does not respond to the antibiotics. Continue morphine prn for pain, dyspnea.  Code Status: DNR Family Communication: *Discussed with daughter at bedside Disposition Plan: *Anticipate hospital death   Consultants:  *CCM  Procedures:  *None  Antibiotics:  Vancomycin 3/26  Aztreonam 3/26  Clindamycin 3/27   HPI/Subjective: Patient seen, unresponsive to verbal and sternal rub.  Objective: Filed Vitals:   09/11/13 1203  BP: 102/46  Pulse: 115  Temp: 98.4 F (36.9 C)  Resp: 18    Intake/Output Summary (Last 24 hours) at 09/11/13 1246 Last data filed at 09/11/13 1203  Gross per 24 hour  Intake 1852.5 ml  Output   1030 ml  Net  822.5 ml   Filed Weights   09/10/13 2100  Weight: 58.7 kg (129 lb 6.6 oz)    Exam:  Physical Exam: Head: Normocephalic, atraumatic.  Eyes: No signs of jaundice, EOMI Nose: Mucous membranes dry.  Throat: Oropharynx nonerythematous, no exudate appreciated.  Neck: supple,No deformities, masses, or tenderness noted. Lungs: Normal respiratory effort. B/L Clear to auscultation, no crackles or wheezes.  Heart: Regular RR. S1 and S2 normal  Abdomen: BS normoactive. Soft, Nondistended, non-tender.  Extremities: No pretibial edema, no erythema  Data Reviewed: Basic Metabolic Panel:  Recent Labs Lab 09/10/13 1000 09/10/13 1251 09/11/13 0640  NA 146  --  145  K 5.4*  --  4.2  CL 98  --  104   CO2 31  --  29  GLUCOSE 106*  --  82  BUN 40*  --  29*  CREATININE 0.84 0.72 0.80  CALCIUM 10.6*  --  9.3   Liver Function Tests:  Recent Labs Lab 09/10/13 1000  AST 45*  ALT 33  ALKPHOS 113  BILITOT 0.2*  PROT 8.0  ALBUMIN 3.9   No results found for this basename: LIPASE, AMYLASE,  in the last 168 hours No results found for this basename: AMMONIA,  in the last 168 hours CBC:  Recent Labs Lab 09/10/13 1000 09/10/13 1251 09/11/13 0640  WBC 13.2* 13.8* 15.4*  NEUTROABS 10.6*  --   --   HGB 14.8 12.4 11.4*  HCT 43.2 36.4 34.6*  MCV 92.5 93.8 94.3  PLT 219 158 146*   Cardiac Enzymes: No results found for this basename: CKTOTAL, CKMB, CKMBINDEX, TROPONINI,  in the last 168 hours BNP (last 3 results) No results found for this basename: PROBNP,  in the last 8760 hours CBG:  Recent Labs Lab 09/10/13 2158  GLUCAP 127*    Recent Results (from the past 240 hour(s))  URINE CULTURE     Status: None   Collection Time    09/10/13 10:22 AM      Result Value Ref Range Status   Specimen Description URINE, CATHETERIZED   Final   Special Requests Immunocompromised   Final   Culture  Setup Time     Final   Value: 09/10/2013 15:41     Performed at [facility] Count     Final   Value:  NO GROWTH     Performed at [facility]   Culture     Final   Value: NO GROWTH     Performed at [facility]   Report Status 09/11/2013 FINAL   Final  CULTURE, BLOOD (ROUTINE X 2)     Status: None   Collection Time    09/10/13 10:37 AM      Result Value Ref Range Status   Specimen Description BLOOD LEFT HAND   Final   Special Requests BOTTLES DRAWN AEROBIC AND ANAEROBIC 5CCS   Final   Culture  Setup Time     Final   Value: 09/10/2013 15:27     Performed at [facility]   Culture     Final   Value:        BLOOD CULTURE RECEIVED NO GROWTH TO DATE CULTURE WILL BE HELD FOR 5 DAYS BEFORE ISSUING A FINAL NEGATIVE REPORT     Performed at [facility]   Report Status PENDING   Incomplete  CULTURE, BLOOD (ROUTINE X 2)     Status: None   Collection Time    09/10/13 10:37 AM      Result Value Ref Range Status   Specimen Description BLOOD LEFT FOREARM   Final   Special Requests BOTTLES DRAWN AEROBIC AND ANAEROBIC 5CCS   Final   Culture  Setup Time     Final   Value: 09/10/2013 15:27     Performed at [facility]   Culture     Final   Value:        BLOOD CULTURE RECEIVED NO GROWTH TO DATE CULTURE WILL BE HELD FOR 5 DAYS BEFORE ISSUING A FINAL NEGATIVE REPORT     Performed at [facility]   Report Status PENDING   Incomplete  CLOSTRIDIUM DIFFICILE BY PCR     Status: None   Collection Time    09/10/13 10:15 PM      Result Value Ref Range Status   C difficile by pcr NEGATIVE  NEGATIVE Final     Studies: Dg Chest Port 1 View  (if Code Sepsis Called)  09/10/2013   CLINICAL DATA:  Shortness of breath and cough  EXAM: PORTABLE CHEST - 1 VIEW  COMPARISON:  09/12/2012  FINDINGS: Cardiac shadow is mildly enlarged. The lungs are well aerated bilaterally. Patchy density is noted in the left mid lung laterally may represent an early infiltrate. No other focal abnormality is seen.  IMPRESSION: Question early patchy infiltrate in the left mid lung laterally.   Electronically Signed   By: Alcide Clever M.D.   On: 09/10/2013 10:20    Scheduled Meds: . antiseptic oral rinse  15 mL Mouth Rinse q12n4p  . aztreonam  1 g Intravenous 3 times per day  . chlorhexidine  15 mL Mouth Rinse BID  . enoxaparin (LOVENOX) injection  40 mg Subcutaneous Q24H  . levalbuterol  0.63 mg Nebulization BID   And  . ipratropium  0.5 mg Nebulization BID  . vancomycin  750 mg Intravenous Q24H   Continuous Infusions: . sodium chloride 75 mL/hr (09/10/13 2015)    Principal Problem:   Sepsis Active Problems:   DEMENTIA, HX OF   Hypotension   Aspiration pneumonia   HCAP (healthcare-associated pneumonia)   Acute respiratory failure    Sepsis(995.91)    Time spent: 25 min    [facility]  [facility] Pager [phone]. If 7PM-7AM, please contact night-coverage at [email or web address], password [facility] 09/11/2013,  12:46 PM  LOS: 1 day

## 2013-09-11 NOTE — Evaluation (Signed)
Physical Therapy Evaluation Patient Details Name: Jill Mckinney MRN: [id number] DOB: [birth year] Today's Date: 09/11/2013   History of Present Illness  Patient is 80 year old female with history of advanced dementia, hypertension who lives with her daughter was brought to the ER via EMS from home for confusion, shortness of breath, vomiting and diarrhea  Clinical Impression  Patient presents with decreased mobility due to deficits listed below (PT problem list).  May benefit from skilled PT in the acute setting to allow return home with family assist (likely no need for HHPT and may transition to Hospice.)      Follow Up Recommendations No PT follow up    Equipment Recommendations  None recommended by PT    Recommendations for Other Services       Precautions / Restrictions Precautions Precautions: Fall      Mobility  Bed Mobility Overal bed mobility: Needs Assistance Bed Mobility: Rolling;Supine to Sit;Sit to Supine Rolling: Max assist   Supine to sit: Max assist;HOB elevated Sit to supine: Total assist   General bed mobility comments: pt initiating sitting up in bed, assist to scoot hips out with pt resistive; to supine pt needed total assist due to resistance; rolling for hygiene pt with delayed response to assist  Transfers Overall transfer level: Needs assistance   Transfers: Sit to/from Stand Sit to Stand: Total assist         General transfer comment: from bed attempted to pivot to chair; pt soiled with diarrhea/BM, so returned to bed for cleaning  Ambulation/Gait                Stairs            Wheelchair Mobility    Modified Rankin (Stroke Patients Only)       Balance Overall balance assessment: Needs assistance   Sitting balance-Leahy Scale: Poor Sitting balance - Comments: leans to left; sat edge of bed with close supervision to minguard assist x 8 minutes attempts for head/neck PROM and trunk mobility for improved  posture/balance, but patient resistive                             Pertinent Vitals/Pain     Home Living Family/patient expects to be discharged to:: Private residence Living Arrangements: Children Available Help at Discharge: Family;Available 24 hours/day;Personal care attendant Type of Home: House       Home Layout: One level Home Equipment: Bedside commode;Wheelchair - manual;Hospital bed Additional Comments: per notes from previous admission more than a year ago patient has 24/7 care between daughter and sitter    Prior Function Level of Independence: Needs assistance               Hand Dominance        Extremity/Trunk Assessment               Lower Extremity Assessment: Difficult to assess due to impaired cognition      Cervical / Trunk Assessment: Kyphotic;Other exceptions  Communication   Communication: Expressive difficulties  Cognition Arousal/Alertness: Awake/alert Behavior During Therapy: Restless Overall Cognitive Status: No family/caregiver present to determine baseline cognitive functioning                      General Comments      Exercises        Assessment/Plan    PT Assessment Patient needs continued PT services  PT Diagnosis Generalized weakness   PT  Problem List Decreased balance;Decreased mobility;Decreased range of motion;Decreased activity tolerance  PT Treatment Interventions Therapeutic exercise;Balance training;Functional mobility training;Therapeutic activities;Patient/family education   PT Goals (Current goals can be found in the Care Plan section) Acute Rehab PT Goals Patient Stated Goal: Patient unable PT Goal Formulation: Patient unable to participate in goal setting Time For Goal Achievement: 09/25/13 Potential to Achieve Goals: Fair    Frequency Min 3X/week   Barriers to discharge        End of Session   Activity Tolerance: Treatment limited secondary to agitation Patient left: in  bed;with call bell/phone within reach;with bed alarm set         Time: 0910-0938 PT Time Calculation (min): 28 min   Charges:   PT Evaluation $Initial PT Evaluation Tier I: 1 Procedure PT Treatments $Therapeutic Activity: 8-22 mins   PT G Codes:          Lasean Rahming,CYNDI 09/11/2013, 10:17 AM Sheran Lawlessyndi Jaydalyn Demattia, PT [phone] 09/11/2013

## 2013-09-11 NOTE — Progress Notes (Signed)
Brief Nutrition Note  Patient identified at risk based on Malnutrition Screening Tool.  Wt Readings from Last 10 Encounters:  09/10/13 129 lb 6.6 oz (58.7 kg)  05/21/12 118 lb (53.524 kg)  03/20/12 121 lb (54.885 kg)  03/11/12 121 lb (54.885 kg)  03/08/12 123 lb 0.3 oz (55.8 kg)  02/12/12 123 lb 8 oz (56.019 kg)  02/06/12 133 lb 9.6 oz (60.6 kg)  01/24/12 124 lb (56.246 kg)  06/18/11 109 lb 6 oz (49.612 kg)  04/20/11 124 lb 15.7 oz (56.69 kg)    Body mass index is 20.9 kg/(m^2).  Per MD note, patient's family would like comfort feeds for patient and palliative care has been consulted. Patient was also discussed in rounds, confirming comfort care.   Nutrition interventions not appropriate at this time.  Marlane MingleAshley Malgorzata Albert, Dietetic Intern Pager: [phone]

## 2013-09-12 LAB — PROCALCITONIN: Procalcitonin: 0.45 ng/mL

## 2013-09-12 NOTE — Progress Notes (Addendum)
[facility] PROGRESS NOTE  Jill Mckinney NWG:[id number] DOB: 03/13/1924 DOA: 09/10/2013 PCP: Illene RegulusMichael Norins, MD  Assessment/Plan: 1. Sepsis/SIRS- Likely HCAP/aspiration pneumonia, started on vancomycin and aztreonam, will also add Clindamycin for anaerobic coverage. Patient has now improved with antibiotics.  2. Dementia- Has been unresponsive, no agitation. 3. Goals of care discussion- Confirmed the goals of care with the daughter and son in law, no aggressive interventions if she does not respond to the antibiotics. Continue morphine prn for pain, dyspnea. 4. Also discussed the feeding, they don't want feeding tubes, want comfort feeds with known aspiration risk. Will start soft diet.  Code Status: DNR Family Communication: *Discussed with daughter at bedside Disposition Plan: *Anticipate hospital death   Consultants:  *CCM  Procedures:  *None  Antibiotics:  Vancomycin 3/26  Aztreonam 3/26  Clindamycin 3/27   HPI/Subjective: Patient seen, alert and talking, though still confused.  Objective: Filed Vitals:   09/12/13 0900  BP: 105/87  Pulse: 98  Temp: 98.9 F (37.2 C)  Resp: 24    Intake/Output Summary (Last 24 hours) at 09/12/13 1333 Last data filed at 09/12/13 0900  Gross per 24 hour  Intake   1100 ml  Output    300 ml  Net    800 ml   Filed Weights   09/10/13 2100 09/11/13 1706  Weight: 58.7 kg (129 lb 6.6 oz) 58.7 kg (129 lb 6.6 oz)    Exam:  Physical Exam: Head: Normocephalic, atraumatic.  Eyes: No signs of jaundice, EOMI Nose: Mucous membranes dry.  Throat: Oropharynx nonerythematous, no exudate appreciated.  Neck: supple,No deformities, masses, or tenderness noted. Lungs: Normal respiratory effort. B/L Clear to auscultation, no crackles or wheezes.  Heart: Regular RR. S1 and S2 normal  Abdomen: BS normoactive. Soft, Nondistended, non-tender.  Extremities: No pretibial edema, no erythema  Data Reviewed: Basic Metabolic  Panel:  Recent Labs Lab 09/10/13 1000 09/10/13 1251 09/11/13 0640  NA 146  --  145  K 5.4*  --  4.2  CL 98  --  104  CO2 31  --  29  GLUCOSE 106*  --  82  BUN 40*  --  29*  CREATININE 0.84 0.72 0.80  CALCIUM 10.6*  --  9.3   Liver Function Tests:  Recent Labs Lab 09/10/13 1000  AST 45*  ALT 33  ALKPHOS 113  BILITOT 0.2*  PROT 8.0  ALBUMIN 3.9   No results found for this basename: LIPASE, AMYLASE,  in the last 168 hours No results found for this basename: AMMONIA,  in the last 168 hours CBC:  Recent Labs Lab 09/10/13 1000 09/10/13 1251 09/11/13 0640  WBC 13.2* 13.8* 15.4*  NEUTROABS 10.6*  --   --   HGB 14.8 12.4 11.4*  HCT 43.2 36.4 34.6*  MCV 92.5 93.8 94.3  PLT 219 158 146*   Cardiac Enzymes: No results found for this basename: CKTOTAL, CKMB, CKMBINDEX, TROPONINI,  in the last 168 hours BNP (last 3 results) No results found for this basename: PROBNP,  in the last 8760 hours CBG:  Recent Labs Lab 09/10/13 2158  GLUCAP 127*    Recent Results (from the past 240 hour(s))  URINE CULTURE     Status: None   Collection Time    09/10/13 10:22 AM      Result Value Ref Range Status   Specimen Description URINE, CATHETERIZED   Final   Special Requests Immunocompromised   Final   Culture  Setup Time     Final  Value: 09/10/2013 15:41     Performed at [facility] Count     Final   Value: NO GROWTH     Performed at [facility]   Culture     Final   Value: NO GROWTH     Performed at [facility]   Report Status 09/11/2013 FINAL   Final  CULTURE, BLOOD (ROUTINE X 2)     Status: None   Collection Time    09/10/13 10:37 AM      Result Value Ref Range Status   Specimen Description BLOOD LEFT HAND   Final   Special Requests BOTTLES DRAWN AEROBIC AND ANAEROBIC 5CCS   Final   Culture  Setup Time     Final   Value: 09/10/2013 15:27     Performed at [facility]   Culture     Final   Value:        BLOOD CULTURE  RECEIVED NO GROWTH TO DATE CULTURE WILL BE HELD FOR 5 DAYS BEFORE ISSUING A FINAL NEGATIVE REPORT     Performed at [facility]   Report Status PENDING   Incomplete  CULTURE, BLOOD (ROUTINE X 2)     Status: None   Collection Time    09/10/13 10:37 AM      Result Value Ref Range Status   Specimen Description BLOOD LEFT FOREARM   Final   Special Requests BOTTLES DRAWN AEROBIC AND ANAEROBIC 5CCS   Final   Culture  Setup Time     Final   Value: 09/10/2013 15:27     Performed at [facility]   Culture     Final   Value:        BLOOD CULTURE RECEIVED NO GROWTH TO DATE CULTURE WILL BE HELD FOR 5 DAYS BEFORE ISSUING A FINAL NEGATIVE REPORT     Performed at [facility]   Report Status PENDING   Incomplete  CLOSTRIDIUM DIFFICILE BY PCR     Status: None   Collection Time    09/10/13 10:15 PM      Result Value Ref Range Status   C difficile by pcr NEGATIVE  NEGATIVE Final     Studies: No results found.  Scheduled Meds: . antiseptic oral rinse  15 mL Mouth Rinse q12n4p  . aztreonam  1 g Intravenous 3 times per day  . chlorhexidine  15 mL Mouth Rinse BID  . clindamycin (CLEOCIN) IV  600 mg Intravenous 3 times per day  . enoxaparin (LOVENOX) injection  40 mg Subcutaneous Q24H  . levalbuterol  0.63 mg Nebulization BID   And  . ipratropium  0.5 mg Nebulization BID  . vancomycin  750 mg Intravenous Q24H   Continuous Infusions: . sodium chloride 75 mL/hr at 09/12/13 0018    Principal Problem:   Sepsis Active Problems:   DEMENTIA, HX OF   Hypotension   Aspiration pneumonia   HCAP (healthcare-associated pneumonia)   Acute respiratory failure   Sepsis(995.91)    Time spent: 25 min    [facility]  [facility] Pager [phone]. If 7PM-7AM, please contact night-coverage at [email or web address], password [facility] 09/12/2013, 1:33 PM  LOS: 2 days

## 2013-09-12 NOTE — Progress Notes (Signed)
PMT meeting scheduled for 1PM 3/28. I have already had a discussion with patient's daughter- family want FULL COMFORT CARE. They are requesting referral to a hospice facility. Will evaluated patient tomorrow. Consider stopping antibiotics and other meds not directly related to her comfort.  Anderson MaltaElizabeth Golding, DO Palliative Medicine

## 2013-09-13 DIAGNOSIS — Z515 Encounter for palliative care: Secondary | ICD-10-CM

## 2013-09-13 DIAGNOSIS — Z66 Do not resuscitate: Secondary | ICD-10-CM

## 2013-09-13 MED ORDER — SODIUM CHLORIDE 0.9 % IV SOLN
25.0000 mg | Freq: Four times a day (QID) | INTRAVENOUS | Status: DC | PRN
  Filled 2013-09-13: qty 1

## 2013-09-13 MED ORDER — DIAZEPAM 5 MG/ML PO CONC: 5.0000 mg | Freq: Three times a day (TID) | ORAL | Status: DC

## 2013-09-13 MED ORDER — MORPHINE SULFATE 10 MG/5ML PO SOLN
5.0000 mg | ORAL | Status: DC | PRN
  Administered 2013-09-13 – 2013-09-16 (×4): 5 mg via ORAL
  Filled 2013-09-13 (×4): qty 5

## 2013-09-13 MED ORDER — DIAZEPAM 1 MG/ML PO SOLN
5.0000 mg | Freq: Three times a day (TID) | ORAL | Status: DC
  Administered 2013-09-13 – 2013-09-16 (×9): 5 mg via ORAL
  Filled 2013-09-13 (×9): qty 5

## 2013-09-13 MED ORDER — OLANZAPINE 10 MG PO TBDP
10.0000 mg | ORAL_TABLET | Freq: Every day | ORAL | Status: DC
  Administered 2013-09-13 – 2013-09-15 (×3): 10 mg via ORAL
  Filled 2013-09-13 (×4): qty 1

## 2013-09-13 NOTE — Progress Notes (Signed)
[facility] PROGRESS NOTE  Jill HaggardWillie B Mckinney ZOX:[id number] DOB: 10/15/1923 DOA: 09/10/2013 PCP: Illene RegulusMichael Norins, MD  Interval history 80 year old female with history of advanced dementia, hypertension who lives with her daughter was brought to the ER via EMS from home for confusion, shortness of breath, vomiting and diarrhea. History was obtained from the daughter, who does not live with the patient at the bedside. Patient unable to provide any history due to her mental status and respiratory distress. Per the daughter, she had noticed the patient to have difficulty breathing and congested for the last 1 week which has been progressively worsening. In the hospital, patient was unresponsive initially, then responded to IV antibiotics and became more alert,  Patient still not eating and drinking, and continued to be agitated. Palliative care consulted for goals of care.   Assessment/Plan: 1. Sepsis/SIRS- Likely HCAP/aspiration pneumonia, started on vancomycin and aztreonam, added  Clindamycin for anaerobic coverage. Family at this time want comfort measures, will discontinue antibiotics. 2. Dementia- has intermittent agitation, palliative care to address symptom management. 3. Goals of care discussion- Confirmed the goals of care with the daughter and son in law, no aggressive interventions if she does not respond to the antibiotics. Continue morphine prn for pain, dyspnea. 4. Also discussed the feeding, they don't want feeding tubes, want comfort feeds with known aspiration risk. Will start soft diet.  Code Status: DNR Family Communication: *Discussed with daughter at bedside Disposition Plan: Will request residential hospice placement as per discussion of family with palliative care.     Consultants:  *CCM  Procedures:  *None  Antibiotics:  Vancomycin 3/26  Aztreonam 3/26  Clindamycin 3/27   HPI/Subjective: Patient seen, alert and talking, though still  confused.  Objective: Filed Vitals:   09/13/13 0900  BP: 120/69  Pulse: 130  Temp: 98 F (36.7 C)  Resp: 24    Intake/Output Summary (Last 24 hours) at 09/13/13 1147 Last data filed at 09/13/13 0900  Gross per 24 hour  Intake    950 ml  Output    371 ml  Net    579 ml   Filed Weights   09/10/13 2100 09/11/13 1706  Weight: 58.7 kg (129 lb 6.6 oz) 58.7 kg (129 lb 6.6 oz)    Exam:  Physical Exam: Head: Normocephalic, atraumatic.  Eyes: No signs of jaundice, EOMI Nose: Mucous membranes dry.  Throat: Oropharynx nonerythematous, no exudate appreciated.  Neck: supple,No deformities, masses, or tenderness noted. Lungs: Normal respiratory effort. B/L Clear to auscultation, no crackles or wheezes.  Heart: Regular RR. S1 and S2 normal  Abdomen: BS normoactive. Soft, Nondistended, non-tender.  Extremities: No pretibial edema, no erythema  Data Reviewed: Basic Metabolic Panel:  Recent Labs Lab 09/10/13 1000 09/10/13 1251 09/11/13 0640  NA 146  --  145  K 5.4*  --  4.2  CL 98  --  104  CO2 31  --  29  GLUCOSE 106*  --  82  BUN 40*  --  29*  CREATININE 0.84 0.72 0.80  CALCIUM 10.6*  --  9.3   Liver Function Tests:  Recent Labs Lab 09/10/13 1000  AST 45*  ALT 33  ALKPHOS 113  BILITOT 0.2*  PROT 8.0  ALBUMIN 3.9   No results found for this basename: LIPASE, AMYLASE,  in the last 168 hours No results found for this basename: AMMONIA,  in the last 168 hours CBC:  Recent Labs Lab 09/10/13 1000 09/10/13 1251 09/11/13 0640  WBC 13.2* 13.8* 15.4*  NEUTROABS  10.6*  --   --   HGB 14.8 12.4 11.4*  HCT 43.2 36.4 34.6*  MCV 92.5 93.8 94.3  PLT 219 158 146*   Cardiac Enzymes: No results found for this basename: CKTOTAL, CKMB, CKMBINDEX, TROPONINI,  in the last 168 hours BNP (last 3 results) No results found for this basename: PROBNP,  in the last 8760 hours CBG:  Recent Labs Lab 09/10/13 2158  GLUCAP 127*    Recent Results (from the past 240 hour(s))   URINE CULTURE     Status: None   Collection Time    09/10/13 10:22 AM      Result Value Ref Range Status   Specimen Description URINE, CATHETERIZED   Final   Special Requests Immunocompromised   Final   Culture  Setup Time     Final   Value: 09/10/2013 15:41     Performed at [facility] Count     Final   Value: NO GROWTH     Performed at [facility]   Culture     Final   Value: NO GROWTH     Performed at [facility]   Report Status 09/11/2013 FINAL   Final  CULTURE, BLOOD (ROUTINE X 2)     Status: None   Collection Time    09/10/13 10:37 AM      Result Value Ref Range Status   Specimen Description BLOOD LEFT HAND   Final   Special Requests BOTTLES DRAWN AEROBIC AND ANAEROBIC 5CCS   Final   Culture  Setup Time     Final   Value: 09/10/2013 15:27     Performed at [facility]   Culture     Final   Value:        BLOOD CULTURE RECEIVED NO GROWTH TO DATE CULTURE WILL BE HELD FOR 5 DAYS BEFORE ISSUING A FINAL NEGATIVE REPORT     Performed at [facility]   Report Status PENDING   Incomplete  CULTURE, BLOOD (ROUTINE X 2)     Status: None   Collection Time    09/10/13 10:37 AM      Result Value Ref Range Status   Specimen Description BLOOD LEFT FOREARM   Final   Special Requests BOTTLES DRAWN AEROBIC AND ANAEROBIC 5CCS   Final   Culture  Setup Time     Final   Value: 09/10/2013 15:27     Performed at [facility]   Culture     Final   Value:        BLOOD CULTURE RECEIVED NO GROWTH TO DATE CULTURE WILL BE HELD FOR 5 DAYS BEFORE ISSUING A FINAL NEGATIVE REPORT     Performed at [facility]   Report Status PENDING   Incomplete  CLOSTRIDIUM DIFFICILE BY PCR     Status: None   Collection Time    09/10/13 10:15 PM      Result Value Ref Range Status   C difficile by pcr NEGATIVE  NEGATIVE Final     Studies: No results found.  Scheduled Meds: . antiseptic oral rinse  15 mL Mouth Rinse q12n4p  . chlorhexidine   15 mL Mouth Rinse BID  . enoxaparin (LOVENOX) injection  40 mg Subcutaneous Q24H  . levalbuterol  0.63 mg Nebulization BID   And  . ipratropium  0.5 mg Nebulization BID   Continuous Infusions: . sodium chloride 75 mL/hr at 09/12/13 4098    Principal Problem:  Sepsis Active Problems:   DEMENTIA, HX OF   Hypotension   Aspiration pneumonia   HCAP (healthcare-associated pneumonia)   Acute respiratory failure   Sepsis(995.91)    Time spent: 25 min    [facility]  [facility] Pager [phone]. If 7PM-7AM, please contact night-coverage at [email or web address], password [facility] 09/13/2013, 11:47 AM  LOS: 3 days

## 2013-09-13 NOTE — Progress Notes (Signed)
Met with family to discuss complete goals of care. She has three daughters who tell a story of a long history of severe mental illness with their mother which was largely hidden by their family. Problems started after the birth of her second daughter Butch Penny with a severe post partum depression and escalated to psychosis after the birth of her third daughter in her late 36's. The oldest daughter Collie Siad remembers that her father who owned and ran [employer] in [place] would take her to work with him when she was 80 years old which in hind sight was because he was afraid of their mothers condition and the impact of her mental illness on the children. Their father died in Aug 29, 2011 and he was described as the caregiver and nurturer in the family- the girls think he was "killed" by their mother because she was verbally abusive to him and required full assistance due to progressive dementia(probably lewy-body). Their mother has had severe dementia for >2 years/ When their dad died they did not grieve-they had to jump right into the role of keeping their mother at home-  And caring for her. They are tired- they are desperate for help-they did not know hospice and palliative care was and option- this is now her 3rd occurence PNA. Functional status decline, difficult to control agitation. They want full comfort and peace for their mother.   Given need for aggressive management of agitation and transition to full comfort I anticipate <2 week prognosis. She is appropriate for a hospice facility for her EOL care.  Family requesting specifically [facility].  Time: 120PM-230PM Time: 70 minutes   Lane Hacker, DO Palliative Medicine

## 2013-09-14 DIAGNOSIS — Z7189 Other specified counseling: Secondary | ICD-10-CM

## 2013-09-14 NOTE — Consult Note (Signed)
[facility] Liaison: Received request from CSW [facility] Chart reviewed. [facility] or tomorrow at this time. Will update CSW in am. Thank you. Forrestine Himva Raeya Merritts LCSW [phone]

## 2013-09-14 NOTE — Clinical Social Work Note (Signed)
Contact made with Forrestine HimEva Davis, [facility] with [facility] to [facility]. CSW later informed that no beds available today, she will f/u with CSW Tuesday. CSW will talk with family about another Hospice facility choice.  Genelle BalVanessa Nykiah Ma, MSW, LCSW [phone]

## 2013-09-14 NOTE — Progress Notes (Signed)
PT Cancellation Note  Patient Details Name: Norville HaggardWillie B Patrone MRN: [id number] DOB: 06/22/1923   Cancelled Treatment:    Reason Eval/Treat Not Completed: Other (comment); patient now full comfort awaiting Hospice bed.  Will sign off per RN report.  Please re-consult PT if patient improves.   Cobin Cadavid,CYNDI 09/14/2013, 10:39 AM

## 2013-09-14 NOTE — Progress Notes (Signed)
[facility] PROGRESS NOTE  Jill Mckinney:[id number] DOB: May 09, 1924 DOA: 09/10/2013 PCP: Illene Regulus, MD  Interval history 80 year old female with history of advanced dementia, hypertension who lives with her daughter was brought to the ER via EMS from home for confusion, shortness of breath, vomiting and diarrhea. History was obtained from the daughter, who does not live with the patient at the bedside. Patient unable to provide any history due to her mental status and respiratory distress. Per the daughter, she had noticed the patient to have difficulty breathing and congested for the last 1 week which has been progressively worsening. In the hospital, patient was unresponsive initially, then responded to IV antibiotics and became more alert,  Patient still not eating and drinking, and continued to be agitated. Palliative care consulted for goals of care.   Assessment/Plan: 1. Sepsis/SIRS- Likely HCAP/aspiration pneumonia, started on vancomycin and aztreonam, added  Clindamycin for anaerobic coverage. Family at this time want comfort measures, will discontinue antibiotics. 2. Dementia- has intermittent agitation, palliative care to address symptom management. 3. Goals of care discussion- Confirmed the goals of care with the daughter and son in law, no aggressive interventions if she does not respond to the antibiotics. Continue morphine prn for pain, dyspnea. 4. Also discussed the feeding, they don't want feeding tubes, want comfort feeds with known aspiration risk. Will start soft diet.  Code Status: DNR Family Communication: *Discussed with daughter at bedside Disposition Plan: Residential hospice when bed is available     Consultants:  *CCM  Procedures:  *None  Antibiotics:  Vancomycin 3/26  Aztreonam 3/26  Clindamycin 3/27   HPI/Subjective: Patient seen, alert and talking, though still confused.  Objective: Filed Vitals:   09/14/13 0905  BP: 112/77   Pulse: 50  Temp: 97.5 F (36.4 C)  Resp: 21    Intake/Output Summary (Last 24 hours) at 09/14/13 1615 Last data filed at 09/14/13 1342  Gross per 24 hour  Intake      0 ml  Output    825 ml  Net   -825 ml   Filed Weights   09/10/13 2100 09/11/13 1706  Weight: 58.7 kg (129 lb 6.6 oz) 58.7 kg (129 lb 6.6 oz)    Exam:  Physical Exam: Head: Normocephalic, atraumatic.  Eyes: No signs of jaundice, EOMI Nose: Mucous membranes dry.  Throat: Oropharynx nonerythematous, no exudate appreciated.  Neck: supple,No deformities, masses, or tenderness noted. Lungs: Normal respiratory effort. B/L Clear to auscultation, no crackles or wheezes.  Heart: Regular RR. S1 and S2 normal  Abdomen: BS normoactive. Soft, Nondistended, non-tender.  Extremities: No pretibial edema, no erythema  Data Reviewed: Basic Metabolic Panel:  Recent Labs Lab 09/10/13 1000 09/10/13 1251 09/11/13 0640  NA 146  --  145  K 5.4*  --  4.2  CL 98  --  104  CO2 31  --  29  GLUCOSE 106*  --  82  BUN 40*  --  29*  CREATININE 0.84 0.72 0.80  CALCIUM 10.6*  --  9.3   Liver Function Tests:  Recent Labs Lab 09/10/13 1000  AST 45*  ALT 33  ALKPHOS 113  BILITOT 0.2*  PROT 8.0  ALBUMIN 3.9   No results found for this basename: LIPASE, AMYLASE,  in the last 168 hours No results found for this basename: AMMONIA,  in the last 168 hours CBC:  Recent Labs Lab 09/10/13 1000 09/10/13 1251 09/11/13 0640  WBC 13.2* 13.8* 15.4*  NEUTROABS 10.6*  --   --  HGB 14.8 12.4 11.4*  HCT 43.2 36.4 34.6*  MCV 92.5 93.8 94.3  PLT 219 158 146*   Cardiac Enzymes: No results found for this basename: CKTOTAL, CKMB, CKMBINDEX, TROPONINI,  in the last 168 hours BNP (last 3 results) No results found for this basename: PROBNP,  in the last 8760 hours CBG:  Recent Labs Lab 09/10/13 2158  GLUCAP 127*    Recent Results (from the past 240 hour(s))  URINE CULTURE     Status: None   Collection Time    09/10/13  10:22 AM      Result Value Ref Range Status   Specimen Description URINE, CATHETERIZED   Final   Special Requests Immunocompromised   Final   Culture  Setup Time     Final   Value: 09/10/2013 15:41     [facility]formed at [facility]   [place] Count     Final   Value: NO GROWTH     [facility]formed [facility]   Culture     Final   Value: NO GROWTH     [facility]formed at [facility]   Report Status 09/11/2013 FINAL   Final  CULTURE, BLOOD (ROUTINE X 2)     Status: None   Collection Time    09/10/13 10:37 AM      Result Value Ref Range Status   Specimen Description BLOOD LEFT HAND   Final   Special Requests BOTTLES DRAWN AEROBIC AND ANAEROBIC 5CCS   Final   Culture  Setup Time     Final   Value: 09/10/2013 15:27     [facility]formed at [facility]   Culture     Final   Value:        BLOOD CULTURE RECEIVED NO GROWTH TO DATE CULTURE WILL BE HELD FOR 5 DAYS BEFORE ISSUING A FINAL NEGATIVE REPORT     [facility]formed at [facility]   Report Status PENDING   Incomplete  CULTURE, BLOOD (ROUTINE X 2)     Status: None   Collection Time    09/10/13 10:37 AM      Result Value Ref Range Status   Specimen Description BLOOD LEFT FOREARM   Final   Special Requests BOTTLES DRAWN AEROBIC AND ANAEROBIC 5CCS   Final   Culture  Setup Time     Final   Value: 09/10/2013 15:27     [facility]formed at [facility]   Culture     Final   Value:        BLOOD CULTURE RECEIVED NO GROWTH TO DATE CULTURE WILL BE HELD FOR 5 DAYS BEFORE ISSUING A FINAL NEGATIVE REPORT     Performed at [facility]   Report Status PENDING   Incomplete  CLOSTRIDIUM DIFFICILE BY PCR     Status: None   Collection Time    09/10/13 10:15 PM      Result Value Ref Range Status   C difficile by pcr NEGATIVE  NEGATIVE Final     Studies: No results found.  Scheduled Meds: . antiseptic oral rinse  15 mL Mouth Rinse q12n4p  . chlorhexidine  15 mL Mouth Rinse BID  . diazepam  5 mg Oral TID  . OLANZapine  zydis  10 mg Oral QHS   Continuous Infusions:    Principal Problem:   Sepsis Active Problems:   DEMENTIA, HX OF   Hypotension   Aspiration pneumonia   HCAP (healthcare-associated pneumonia)   Acute respiratory failure   Sepsis(995.91)  DNR (do not resuscitate)   Palliative care patient    Time spent: 25 min    [facility]  [facility] Pager [phone]. If 7PM-7AM, please contact night-coverage at [email or web address], password [facility] 09/14/2013, 4:15 PM  LOS: 4 days

## 2013-09-14 NOTE — Progress Notes (Signed)
SLP Cancellation Note  Patient Details Name: Jill Mckinney MRN: [id number] DOB: 10/21/1923   Cancelled treatment:        Pt is full comfort and on a diet for comfort. SLP will sign off. Please call if there are needs we can address. Harlon DittyBonnie Garrett Bowring, MA CCC-SLP [phone]    Claudine MoutonDeBlois, Anuel Sitter Caroline 09/14/2013, 7:46 AM

## 2013-09-15 NOTE — Progress Notes (Signed)
In pts room to find she had taken off her oxygen. Sats 84%! Tried to place O2 back on and pt stated yelling "STOP THAT" and hitting at me. Dr. Sharl MaLama made aware.

## 2013-09-15 NOTE — Clinical Social Work Note (Signed)
CSW informed by Forrestine HimEva Davis, [facility] with [facility] tomorrow. She will meet with family to complete admissions paperwork. [facility].  Genelle BalVanessa Jameon Deller, MSW, LCSW [phone]

## 2013-09-15 NOTE — Progress Notes (Signed)
[facility] PROGRESS NOTE  Jill Mckinney JXB:[id number] DOB: Nov 13, 1923 DOA: 09/10/2013 PCP: Illene Regulus, MD  Interval history 80 year old female with history of advanced dementia, hypertension who lives with her daughter was brought to the ER via EMS from home for confusion, shortness of breath, vomiting and diarrhea. History was obtained from the daughter, who does not live with the patient at the bedside. Patient unable to provide any history due to her mental status and respiratory distress. Per the daughter, she had noticed the patient to have difficulty breathing and congested for the last 1 week which has been progressively worsening. In the hospital, patient was unresponsive initially, then responded to IV antibiotics and became more alert,  Patient still not eating and drinking, and continued to be agitated. Palliative care consulted for goals of care.   Assessment/Plan: 1. Sepsis/SIRS- Likely HCAP/aspiration pneumonia, started on vancomycin and aztreonam, added  Clindamycin for anaerobic coverage. Family at this time want comfort measures, will discontinue antibiotics. 2. Dementia- has intermittent agitation, palliative care to address symptom management. 3. Goals of care discussion- Confirmed the goals of care with the daughter and son in law, no aggressive interventions if she does not respond to the antibiotics. Continue morphine prn for pain, dyspnea. 4. Also discussed the feeding, they don't want feeding tubes, want comfort feeds with known aspiration risk. Will start soft diet.  Code Status: DNR Family Communication: *Discussed with daughter at bedside Disposition Plan: Residential hospice when bed is available     Consultants:  *CCM  Procedures:  *None  Antibiotics:  Vancomycin 3/26  Aztreonam 3/26  Clindamycin 3/27   HPI/Subjective: Patient seen, alert and talking, though still confused.  Objective: Filed Vitals:   09/15/13 0537  BP: 103/70   Pulse: 87  Temp: 97.5 F (36.4 C)  Resp: 14    Intake/Output Summary (Last 24 hours) at 09/15/13 1334 Last data filed at 09/15/13 1100  Gross per 24 hour  Intake     60 ml  Output    701 ml  Net   -641 ml   Filed Weights   09/10/13 2100 09/11/13 1706  Weight: 58.7 kg (129 lb 6.6 oz) 58.7 kg (129 lb 6.6 oz)    Exam:  Physical Exam: Head: Normocephalic, atraumatic.  Eyes: No signs of jaundice, EOMI Nose: Mucous membranes dry.  Throat: Oropharynx nonerythematous, no exudate appreciated.  Neck: supple,No deformities, masses, or tenderness noted. Lungs: Normal respiratory effort. B/L Clear to auscultation, no crackles or wheezes.  Heart: Regular RR. S1 and S2 normal  Abdomen: BS normoactive. Soft, Nondistended, non-tender.  Extremities: No pretibial edema, no erythema  Data Reviewed: Basic Metabolic Panel:  Recent Labs Lab 09/10/13 1000 09/10/13 1251 09/11/13 0640  NA 146  --  145  K 5.4*  --  4.2  CL 98  --  104  CO2 31  --  29  GLUCOSE 106*  --  82  BUN 40*  --  29*  CREATININE 0.84 0.72 0.80  CALCIUM 10.6*  --  9.3   Liver Function Tests:  Recent Labs Lab 09/10/13 1000  AST 45*  ALT 33  ALKPHOS 113  BILITOT 0.2*  PROT 8.0  ALBUMIN 3.9   No results found for this basename: LIPASE, AMYLASE,  in the last 168 hours No results found for this basename: AMMONIA,  in the last 168 hours CBC:  Recent Labs Lab 09/10/13 1000 09/10/13 1251 09/11/13 0640  WBC 13.2* 13.8* 15.4*  NEUTROABS 10.6*  --   --  HGB 14.8 12.4 11.4*  HCT 43.2 36.4 34.6*  MCV 92.5 93.8 94.3  PLT 219 158 146*   Cardiac Enzymes: No results found for this basename: CKTOTAL, CKMB, CKMBINDEX, TROPONINI,  in the last 168 hours BNP (last 3 results) No results found for this basename: PROBNP,  in the last 8760 hours CBG:  Recent Labs Lab 09/10/13 2158  GLUCAP 127*    Recent Results (from the past 240 hour(s))  URINE CULTURE     Status: None   Collection Time    09/10/13  10:22 AM      Result Value Ref Range Status   Specimen Description URINE, CATHETERIZED   Final   Special Requests Immunocompromised   Final   Culture  Setup Time     Final   Value: 09/10/2013 15:41     Performed at [facility]   Colony Count     Final   Value: NO GROWTH     Performed at [facility]   Culture     Final   Value: NO GROWTH     Performed at [facility]   Report Status 09/11/2013 FINAL   Final  CULTURE, BLOOD (ROUTINE X 2)     Status: None   Collection Time    09/10/13 10:37 AM      Result Value Ref Range Status   Specimen Description BLOOD LEFT HAND   Final   Special Requests BOTTLES DRAWN AEROBIC AND ANAEROBIC 5CCS   Final   Culture  Setup Time     Final   Value: 09/10/2013 15:27     Performed at [facility]   Culture     Final   Value:        BLOOD CULTURE RECEIVED NO GROWTH TO DATE CULTURE WILL BE HELD FOR 5 DAYS BEFORE ISSUING A FINAL NEGATIVE REPORT     Performed at [facility]   Report Status PENDING   Incomplete  CULTURE, BLOOD (ROUTINE X 2)     Status: None   Collection Time    09/10/13 10:37 AM      Result Value Ref Range Status   Specimen Description BLOOD LEFT FOREARM   Final   Special Requests BOTTLES DRAWN AEROBIC AND ANAEROBIC 5CCS   Final   Culture  Setup Time     Final   Value: 09/10/2013 15:27     Performed at [facility]   Culture     Final   Value:        BLOOD CULTURE RECEIVED NO GROWTH TO DATE CULTURE WILL BE HELD FOR 5 DAYS BEFORE ISSUING A FINAL NEGATIVE REPORT     Performed at [facility]   Report Status PENDING   Incomplete  CLOSTRIDIUM DIFFICILE BY PCR     Status: None   Collection Time    09/10/13 10:15 PM      Result Value Ref Range Status   C difficile by pcr NEGATIVE  NEGATIVE Final     Studies: No results found.  Scheduled Meds: . antiseptic oral rinse  15 mL Mouth Rinse q12n4p  . chlorhexidine  15 mL Mouth Rinse BID  . diazepam  5 mg Oral TID  . OLANZapine  zydis  10 mg Oral QHS   Continuous Infusions:    Principal Problem:   Sepsis Active Problems:   DEMENTIA, HX OF   Hypotension   Aspiration pneumonia   HCAP (healthcare-associated pneumonia)   Acute respiratory failure   Sepsis(995.91)  DNR (do not resuscitate)   Palliative care patient    Time spent: 25 min    [facility] MonroeAMA,Saxton Chain S  [facility] Pager [phone]. If 7PM-7AM, please contact night-coverage at [email or web address], password [email or web address] 09/15/2013, 1:34 PM  LOS: 5 days

## 2013-09-15 NOTE — Consult Note (Signed)
[facility] Liaison: [facility] room available for patient tomorrow 09/16/2013. Met with daughter earlier today to complete paper work. Dr. Orpah Melter to assume care per family request. Will need DC summary faxed to [phone] and RN to call report to [phone]. Please arrange transport for patient to arrive at [facility] before noon. Thank you. Erling Conte LCSW [phone]

## 2013-09-16 DIAGNOSIS — R627 Adult failure to thrive: Secondary | ICD-10-CM

## 2013-09-16 LAB — CULTURE, BLOOD (ROUTINE X 2)
CULTURE: NO GROWTH
Culture: NO GROWTH

## 2013-09-16 MED ORDER — ACETAMINOPHEN 650 MG RE SUPP: 650.0000 mg | RECTAL | Status: AC | PRN

## 2013-09-16 MED ORDER — DIAZEPAM 1 MG/ML PO SOLN: 5.0000 mg | Freq: Three times a day (TID) | ORAL | Status: AC

## 2013-09-16 MED ORDER — MORPHINE SULFATE 10 MG/5ML PO SOLN: 5.0000 mg | ORAL | Status: AC | PRN

## 2013-09-16 NOTE — Clinical Social Work Note (Signed)
Mr. Jill Mckinney will discharge [facility] today. CSW facilitated transport via ambulance. Family aware of discharge.  Genelle BalVanessa Tala Eber, MSW, LCSW [phone]

## 2013-09-16 NOTE — Discharge Summary (Signed)
Physician Discharge Summary  Jill Mckinney ZOX:[id number] DOB: 01/31/1924 DOA: 09/10/2013  PCP: Illene RegulusMichael Norins, MD  Admit date: 09/10/2013 Discharge date: 09/16/2013  Time spent: >30 minutes  Recommendations for Outpatient Follow-up:  1. Transfer[facility].  Discharge Diagnoses:  Principal Problem:   Sepsis Active Problems:   DEMENTIA, HX OF   Hypotension   Aspiration pneumonia   HCAP (healthcare-associated pneumonia)   Acute respiratory failure   Sepsis(995.91)   DNR (do not resuscitate)   Palliative care patient   Discharge Condition: stable, poor prognosis>  she is been discharged to hospice/[facility].  Diet recommendation:Comfort feeds   Filed Weights   09/10/13 2100 09/11/13 1706  Weight: 58.7 kg (129 lb 6.6 oz) 58.7 kg (129 lb 6.6 oz)    History of present illness:  Patient is 80 year old female with history of advanced dementia, hypertension who lives with her daughter was brought to the ER via EMS from home for confusion, shortness of breath, vomiting and diarrhea. History was obtained from the daughter, who does not live with the patient at the bedside. Patient unable to provide any history due to her mental status and respiratory distress. Per the daughter, she had noticed the patient to have difficulty breathing and congested for the last 1 week which has been progressively worsening. Per the daughter, it acutely worsened today.  She reports that patient usually eats regular diet. She also had vomiting, diarrhea no mental status in the last 2 days.  In the ER, patient was noticed to have temp of 96.9, heart rate are 113, initial BP 67/47 which improved to 120/93 with IV fluids. UA was negative for UTI. Lactic acid elevated at 2.67.  Chest x-ray showed early patchy infiltrate in the left mid lung laterally. She was admitted for further evaluation and management.   Hospital Course:  1. Sepsis/SIRS- Likely HCAP/aspiration  pneumonia. As discussed above upon admission she was started on vancomycin and aztreonam, added Clindamycin for anaerobic coverage. The rounding M.D. followed and consulted palliative care and  Family at this time stated they want comfort measures, and so antibiotics were discontinued. Family also indicated that they wanted [facility] continued full comfort measures and social work assisted with her placement and she'll be discharged there today. 2. Dementia- has intermittent agitation, she was maintained on Zyprexa, palliative care as the patient and she was placed on a scheduled diazepam 3. Goals of care discussion- palliative care was consulted and saw patient in the hospital and they confirmed that they wanted her to be DO NOT RESUSCITATE and also stated that he wanted full comfort measures for her, no aggressive interventions . She was placed on morphine prn for pain, dyspnea, nonessential medications were discontinued.      The rounding hospitalist- Dr. Sharl MaLama discussed feeding with family, and they don't want feeding tubes. They wanted comfort feeds with known  aspiration risk and she was started on a soft diet and is to continue this on discharge.   Procedures:  None   Consultations:  Palliative care   Discharge Exam: Filed Vitals:   09/16/13 1000  BP: 124/69  Pulse: 121  Temp: 98 F (36.7 C)  Resp: 18   Physical Exam:  In general: Grimaces to sternal rub Head: Normocephalic, atraumatic.  Eyes: No signs of jaundice, EOMI  Nose: Mucous membranes dry.  Throat: Oropharynx nonerythematous, no exudate appreciated.  Neck: supple,No deformities, masses, or tenderness noted.  Lungs: Decreased breath sounds  at the bases, no crackles or wheezes.  Heart: Regular RR. S1 and S2 normal  Abdomen: BS normoactive. Soft, Nondistended, non-tender.  Extremities: No pretibial edema, no erythema   Discharge Instructions  Discharge Orders   Future Orders Complete By Expires    Diet general  As directed    Increase activity slowly  As directed        Medication List    STOP taking these medications       acetaminophen 325 MG tablet  Commonly known as:  TYLENOL  Replaced by:  acetaminophen 650 MG suppository     aspirin EC 81 MG tablet     CALCIUM CITRATE + PO     diltiazem 60 MG tablet  Commonly known as:  CARDIZEM     divalproex 125 MG capsule  Commonly known as:  DEPAKOTE SPRINKLE     folic acid 400 MCG tablet  Commonly known as:  FOLVITE     furosemide 20 MG tablet  Commonly known as:  LASIX     magnesium oxide 400 MG tablet  Commonly known as:  MAG-OX     mirtazapine 15 MG tablet  Commonly known as:  REMERON     polyethylene glycol packet  Commonly known as:  MIRALAX / GLYCOLAX     traMADol 50 MG tablet  Commonly known as:  ULTRAM     VITAMIN D-3 PO      TAKE these medications       acetaminophen 650 MG suppository  Commonly known as:  TYLENOL  Place 1 suppository (650 mg total) rectally every 4 (four) hours as needed (prn TEMP>38.1 Celsius).     diazepam 1 MG/ML solution  Commonly known as:  VALIUM  Take 5 mLs (5 mg total) by mouth 3 (three) times daily.     feeding supplement (ENSURE) Pudg  Take 1 Container by mouth 3 (three) times daily between meals.     morphine 10 MG/5ML solution  Take 2.5 mLs (5 mg total) by mouth every 2 (two) hours as needed.     OLANZapine 10 MG tablet  Commonly known as:  ZYPREXA  Take 5 mg by mouth at bedtime.       Allergies  Allergen Reactions  . Codeine Nausea And Vomiting  . Penicillins Rash  . Sulfa Antibiotics Rash      The results of significant diagnostics from this hospitalization (including imaging, microbiology, ancillary and laboratory) are listed below for reference.    Significant Diagnostic Studies: Dg Chest Port 1 View  (if Code Sepsis Called)  09/10/2013   CLINICAL DATA:  Shortness of breath and cough  EXAM: PORTABLE CHEST - 1 VIEW  COMPARISON:  09/12/2012   FINDINGS: Cardiac shadow is mildly enlarged. The lungs are well aerated bilaterally. Patchy density is noted in the left mid lung laterally may represent an early infiltrate. No other focal abnormality is seen.  IMPRESSION: Question early patchy infiltrate in the left mid lung laterally.   Electronically Signed   By: Alcide Clever M.D.   On: 09/10/2013 10:20    Microbiology: Recent Results (from the past 240 hour(s))  URINE CULTURE     Status: None   Collection Time    09/10/13 10:22 AM      Result Value Ref Range Status   Specimen Description URINE, CATHETERIZED   Final   Special Requests Immunocompromised   Final   Culture  Setup Time     Final   Value: 09/10/2013 15:41  Performed at [facility] Count     Final   Value: NO GROWTH     Performed at [facility]   Culture     Final   Value: NO GROWTH     Performed at [facility]   Report Status 09/11/2013 FINAL   Final  CULTURE, BLOOD (ROUTINE X 2)     Status: None   Collection Time    09/10/13 10:37 AM      Result Value Ref Range Status   Specimen Description BLOOD LEFT HAND   Final   Special Requests BOTTLES DRAWN AEROBIC AND ANAEROBIC 5CCS   Final   Culture  Setup Time     Final   Value: 09/10/2013 15:27     Performed at [facility]   Culture     Final   Value: NO GROWTH 5 DAYS     Performed at [facility]   Report Status 09/16/2013 FINAL   Final  CULTURE, BLOOD (ROUTINE X 2)     Status: None   Collection Time    09/10/13 10:37 AM      Result Value Ref Range Status   Specimen Description BLOOD LEFT FOREARM   Final   Special Requests BOTTLES DRAWN AEROBIC AND ANAEROBIC 5CCS   Final   Culture  Setup Time     Final   Value: 09/10/2013 15:27     Performed at [facility]   Culture     Final   Value: NO GROWTH 5 DAYS     Performed at [facility]   Report Status 09/16/2013 FINAL   Final  CLOSTRIDIUM DIFFICILE BY PCR     Status: None   Collection Time     09/10/13 10:15 PM      Result Value Ref Range Status   C difficile by pcr NEGATIVE  NEGATIVE Final     Labs: Basic Metabolic Panel:  Recent Labs Lab 09/10/13 1000 09/10/13 1251 09/11/13 0640  NA 146  --  145  K 5.4*  --  4.2  CL 98  --  104  CO2 31  --  29  GLUCOSE 106*  --  82  BUN 40*  --  29*  CREATININE 0.84 0.72 0.80  CALCIUM 10.6*  --  9.3   Liver Function Tests:  Recent Labs Lab 09/10/13 1000  AST 45*  ALT 33  ALKPHOS 113  BILITOT 0.2*  PROT 8.0  ALBUMIN 3.9   No results found for this basename: LIPASE, AMYLASE,  in the last 168 hours No results found for this basename: AMMONIA,  in the last 168 hours CBC:  Recent Labs Lab 09/10/13 1000 09/10/13 1251 09/11/13 0640  WBC 13.2* 13.8* 15.4*  NEUTROABS 10.6*  --   --   HGB 14.8 12.4 11.4*  HCT 43.2 36.4 34.6*  MCV 92.5 93.8 94.3  PLT 219 158 146*   Cardiac Enzymes: No results found for this basename: CKTOTAL, CKMB, CKMBINDEX, TROPONINI,  in the last 168 hours BNP: BNP (last 3 results) No results found for this basename: PROBNP,  in the last 8760 hours CBG:  Recent Labs Lab 09/10/13 2158  GLUCAP 127*       Signed:  Amar Keenum C  [facility] 09/16/2013, 10:29 AM

## 2013-09-16 NOTE — Progress Notes (Signed)
[facility] to [facility]. Barbera Settersurner, Aaidyn San B

## 2013-09-16 NOTE — Progress Notes (Signed)
Pt dtrs Bjorn Loserhonda and Lupita LeashDonna notified of pt  transfer to [facility]. Barbera Settersurner, Aqil Goetting B

## 2013-10-16 DEATH — deceased
